# Patient Record
Sex: Female | Born: 1945
Health system: Southern US, Community
[De-identification: ages and names within clinical notes are randomized; demographics above are authoritative.]

## PROBLEM LIST (undated history)

## (undated) DIAGNOSIS — K219 Gastro-esophageal reflux disease without esophagitis: Secondary | ICD-10-CM

## (undated) DIAGNOSIS — Z981 Arthrodesis status: Secondary | ICD-10-CM

## (undated) DIAGNOSIS — I714 Abdominal aortic aneurysm, without rupture, unspecified: Secondary | ICD-10-CM

## (undated) DIAGNOSIS — K589 Irritable bowel syndrome without diarrhea: Secondary | ICD-10-CM

## (undated) DIAGNOSIS — E785 Hyperlipidemia, unspecified: Secondary | ICD-10-CM

## (undated) DIAGNOSIS — I341 Nonrheumatic mitral (valve) prolapse: Secondary | ICD-10-CM

## (undated) DIAGNOSIS — B019 Varicella without complication: Secondary | ICD-10-CM

## (undated) DIAGNOSIS — I8390 Asymptomatic varicose veins of unspecified lower extremity: Secondary | ICD-10-CM

## (undated) DIAGNOSIS — I4510 Unspecified right bundle-branch block: Secondary | ICD-10-CM

## (undated) DIAGNOSIS — J449 Chronic obstructive pulmonary disease, unspecified: Secondary | ICD-10-CM

## (undated) DIAGNOSIS — Z9889 Other specified postprocedural states: Secondary | ICD-10-CM

## (undated) DIAGNOSIS — M5136 Other intervertebral disc degeneration, lumbar region: Secondary | ICD-10-CM

## (undated) DIAGNOSIS — M1990 Unspecified osteoarthritis, unspecified site: Secondary | ICD-10-CM

## (undated) DIAGNOSIS — I739 Peripheral vascular disease, unspecified: Secondary | ICD-10-CM

## (undated) DIAGNOSIS — Z460 Encounter for fitting and adjustment of spectacles and contact lenses: Secondary | ICD-10-CM

## (undated) DIAGNOSIS — J302 Other seasonal allergic rhinitis: Secondary | ICD-10-CM

## (undated) DIAGNOSIS — I340 Nonrheumatic mitral (valve) insufficiency: Secondary | ICD-10-CM

## (undated) DIAGNOSIS — R519 Headache, unspecified: Secondary | ICD-10-CM

## (undated) DIAGNOSIS — Z9289 Personal history of other medical treatment: Secondary | ICD-10-CM

## (undated) DIAGNOSIS — M51369 Other intervertebral disc degeneration, lumbar region without mention of lumbar back pain or lower extremity pain: Secondary | ICD-10-CM

## (undated) HISTORY — DX: Hyperlipidemia, unspecified: E78.5

## (undated) HISTORY — DX: Personal history of other medical treatment: Z92.89

## (undated) HISTORY — DX: Asymptomatic varicose veins of unspecified lower extremity: I83.90

## (undated) HISTORY — PX: TONSILLECTOMY: SUR1361

## (undated) HISTORY — DX: Varicella without complication: B01.9

## (undated) HISTORY — DX: Nonrheumatic mitral (valve) insufficiency: I34.0

## (undated) HISTORY — PX: CHOLECYSTECTOMY: SHX55

## (undated) HISTORY — PX: BREAST BIOPSY: SHX20

## (undated) HISTORY — PX: BACK SURGERY: SHX140

## (undated) HISTORY — DX: Gastro-esophageal reflux disease without esophagitis: K21.9

## (undated) HISTORY — DX: Unspecified right bundle-branch block: I45.10

## (undated) HISTORY — PX: APPENDECTOMY: SHX54

## (undated) HISTORY — PX: JOINT REPLACEMENT: SHX530

## (undated) HISTORY — PX: COLONOSCOPY: SHX174

## (undated) HISTORY — PX: BREAST EXCISIONAL BIOPSY: SUR124

## (undated) HISTORY — PX: OTHER SURGICAL HISTORY: SHX169

## (undated) HISTORY — DX: Nonrheumatic mitral (valve) prolapse: I34.1

---
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
## 1991-09-30 HISTORY — PX: VAGINAL HYSTERECTOMY: SUR661

## 1998-06-13 ENCOUNTER — Ambulatory Visit (HOSPITAL_COMMUNITY): Admission: RE | Admit: 1998-06-13 | Discharge: 1998-06-13 | Payer: Self-pay | Admitting: Neurosurgery

## 1998-06-13 ENCOUNTER — Encounter: Payer: Self-pay | Admitting: Neurosurgery

## 1998-06-18 ENCOUNTER — Ambulatory Visit (HOSPITAL_COMMUNITY): Admission: RE | Admit: 1998-06-18 | Discharge: 1998-06-18 | Payer: Self-pay | Admitting: Neurosurgery

## 1998-06-29 ENCOUNTER — Encounter: Payer: Self-pay | Admitting: Neurosurgery

## 1998-06-29 ENCOUNTER — Inpatient Hospital Stay (HOSPITAL_COMMUNITY): Admission: RE | Admit: 1998-06-29 | Discharge: 1998-06-30 | Payer: Self-pay | Admitting: Neurosurgery

## 1998-10-04 ENCOUNTER — Other Ambulatory Visit: Admission: RE | Admit: 1998-10-04 | Discharge: 1998-10-04 | Payer: Self-pay | Admitting: Obstetrics and Gynecology

## 1998-10-30 ENCOUNTER — Encounter: Payer: Self-pay | Admitting: Obstetrics and Gynecology

## 1998-10-30 ENCOUNTER — Ambulatory Visit (HOSPITAL_COMMUNITY): Admission: RE | Admit: 1998-10-30 | Discharge: 1998-10-30 | Payer: Self-pay | Admitting: Obstetrics and Gynecology

## 1999-08-20 ENCOUNTER — Ambulatory Visit (HOSPITAL_COMMUNITY): Admission: RE | Admit: 1999-08-20 | Discharge: 1999-08-20 | Payer: Self-pay | Admitting: Gastroenterology

## 1999-11-04 ENCOUNTER — Ambulatory Visit (HOSPITAL_COMMUNITY): Admission: RE | Admit: 1999-11-04 | Discharge: 1999-11-04 | Payer: Self-pay | Admitting: Obstetrics and Gynecology

## 1999-11-04 ENCOUNTER — Encounter: Payer: Self-pay | Admitting: Obstetrics and Gynecology

## 1999-11-26 ENCOUNTER — Other Ambulatory Visit: Admission: RE | Admit: 1999-11-26 | Discharge: 1999-11-26 | Payer: Self-pay | Admitting: Obstetrics and Gynecology

## 2000-06-02 ENCOUNTER — Encounter: Payer: Self-pay | Admitting: Gastroenterology

## 2000-06-02 ENCOUNTER — Ambulatory Visit (HOSPITAL_COMMUNITY): Admission: RE | Admit: 2000-06-02 | Discharge: 2000-06-02 | Payer: Self-pay | Admitting: Gastroenterology

## 2000-06-08 ENCOUNTER — Ambulatory Visit (HOSPITAL_COMMUNITY): Admission: RE | Admit: 2000-06-08 | Discharge: 2000-06-08 | Payer: Self-pay | Admitting: Gastroenterology

## 2000-06-08 ENCOUNTER — Encounter: Payer: Self-pay | Admitting: Gastroenterology

## 2000-06-23 ENCOUNTER — Ambulatory Visit (HOSPITAL_COMMUNITY): Admission: RE | Admit: 2000-06-23 | Discharge: 2000-06-23 | Payer: Self-pay | Admitting: Gastroenterology

## 2000-06-23 ENCOUNTER — Encounter: Payer: Self-pay | Admitting: Gastroenterology

## 2000-07-01 ENCOUNTER — Ambulatory Visit (HOSPITAL_COMMUNITY): Admission: RE | Admit: 2000-07-01 | Discharge: 2000-07-01 | Payer: Self-pay | Admitting: Gastroenterology

## 2000-07-01 ENCOUNTER — Encounter (INDEPENDENT_AMBULATORY_CARE_PROVIDER_SITE_OTHER): Payer: Self-pay | Admitting: Specialist

## 2000-08-04 ENCOUNTER — Encounter: Payer: Self-pay | Admitting: Neurosurgery

## 2000-08-04 ENCOUNTER — Ambulatory Visit (HOSPITAL_COMMUNITY): Admission: RE | Admit: 2000-08-04 | Discharge: 2000-08-04 | Payer: Self-pay | Admitting: Neurosurgery

## 2000-08-14 ENCOUNTER — Encounter: Admission: RE | Admit: 2000-08-14 | Discharge: 2000-11-12 | Payer: Self-pay | Admitting: Anesthesiology

## 2000-10-02 ENCOUNTER — Encounter: Payer: Self-pay | Admitting: Neurosurgery

## 2000-10-06 ENCOUNTER — Ambulatory Visit (HOSPITAL_COMMUNITY): Admission: RE | Admit: 2000-10-06 | Discharge: 2000-10-07 | Payer: Self-pay | Admitting: Neurosurgery

## 2000-10-06 ENCOUNTER — Encounter: Payer: Self-pay | Admitting: Neurosurgery

## 2000-11-05 ENCOUNTER — Encounter: Payer: Self-pay | Admitting: Obstetrics and Gynecology

## 2000-11-05 ENCOUNTER — Ambulatory Visit (HOSPITAL_COMMUNITY): Admission: RE | Admit: 2000-11-05 | Discharge: 2000-11-05 | Payer: Self-pay | Admitting: Obstetrics and Gynecology

## 2000-11-25 ENCOUNTER — Other Ambulatory Visit: Admission: RE | Admit: 2000-11-25 | Discharge: 2000-11-25 | Payer: Self-pay | Admitting: Obstetrics and Gynecology

## 2002-01-05 ENCOUNTER — Other Ambulatory Visit: Admission: RE | Admit: 2002-01-05 | Discharge: 2002-01-05 | Payer: Self-pay | Admitting: Obstetrics and Gynecology

## 2002-09-17 ENCOUNTER — Encounter: Payer: Self-pay | Admitting: Neurosurgery

## 2002-09-17 ENCOUNTER — Ambulatory Visit (HOSPITAL_COMMUNITY): Admission: RE | Admit: 2002-09-17 | Discharge: 2002-09-17 | Payer: Self-pay | Admitting: Neurosurgery

## 2002-10-04 ENCOUNTER — Ambulatory Visit (HOSPITAL_COMMUNITY): Admission: RE | Admit: 2002-10-04 | Discharge: 2002-10-04 | Payer: Self-pay | Admitting: Neurosurgery

## 2002-10-04 ENCOUNTER — Encounter: Payer: Self-pay | Admitting: Neurosurgery

## 2002-10-07 ENCOUNTER — Encounter: Payer: Self-pay | Admitting: Neurosurgery

## 2002-10-07 ENCOUNTER — Inpatient Hospital Stay (HOSPITAL_COMMUNITY): Admission: RE | Admit: 2002-10-07 | Discharge: 2002-10-09 | Payer: Self-pay | Admitting: Neurosurgery

## 2003-01-26 ENCOUNTER — Other Ambulatory Visit: Admission: RE | Admit: 2003-01-26 | Discharge: 2003-01-26 | Payer: Self-pay | Admitting: Obstetrics and Gynecology

## 2003-02-10 ENCOUNTER — Encounter: Payer: Self-pay | Admitting: Orthopaedic Surgery

## 2003-02-10 ENCOUNTER — Ambulatory Visit (HOSPITAL_COMMUNITY): Admission: RE | Admit: 2003-02-10 | Discharge: 2003-02-10 | Payer: Self-pay | Admitting: Orthopaedic Surgery

## 2003-04-13 HISTORY — PX: NM MYOCAR PERF WALL MOTION: HXRAD629

## 2003-04-13 HISTORY — PX: US ECHOCARDIOGRAPHY: HXRAD669

## 2003-10-24 ENCOUNTER — Ambulatory Visit (HOSPITAL_COMMUNITY): Admission: RE | Admit: 2003-10-24 | Discharge: 2003-10-24 | Payer: Self-pay | Admitting: Obstetrics and Gynecology

## 2003-10-24 ENCOUNTER — Inpatient Hospital Stay (HOSPITAL_COMMUNITY): Admission: EM | Admit: 2003-10-24 | Discharge: 2003-10-25 | Payer: Self-pay | Admitting: *Deleted

## 2003-10-30 ENCOUNTER — Encounter: Admission: RE | Admit: 2003-10-30 | Discharge: 2003-10-30 | Payer: Self-pay | Admitting: Obstetrics and Gynecology

## 2003-10-30 ENCOUNTER — Encounter (INDEPENDENT_AMBULATORY_CARE_PROVIDER_SITE_OTHER): Payer: Self-pay | Admitting: *Deleted

## 2003-12-12 ENCOUNTER — Ambulatory Visit (HOSPITAL_COMMUNITY): Admission: RE | Admit: 2003-12-12 | Discharge: 2003-12-12 | Payer: Self-pay | Admitting: Surgery

## 2003-12-12 ENCOUNTER — Encounter (INDEPENDENT_AMBULATORY_CARE_PROVIDER_SITE_OTHER): Payer: Self-pay | Admitting: *Deleted

## 2003-12-12 ENCOUNTER — Ambulatory Visit (HOSPITAL_BASED_OUTPATIENT_CLINIC_OR_DEPARTMENT_OTHER): Admission: RE | Admit: 2003-12-12 | Discharge: 2003-12-12 | Payer: Self-pay | Admitting: Surgery

## 2004-02-06 ENCOUNTER — Other Ambulatory Visit: Admission: RE | Admit: 2004-02-06 | Discharge: 2004-02-06 | Payer: Self-pay | Admitting: Obstetrics and Gynecology

## 2004-06-06 ENCOUNTER — Encounter: Admission: RE | Admit: 2004-06-06 | Discharge: 2004-06-06 | Payer: Self-pay | Admitting: Obstetrics and Gynecology

## 2004-07-22 ENCOUNTER — Encounter: Admission: RE | Admit: 2004-07-22 | Discharge: 2004-08-27 | Payer: Self-pay | Admitting: Family Medicine

## 2004-09-05 ENCOUNTER — Encounter: Admission: RE | Admit: 2004-09-05 | Discharge: 2004-09-05 | Payer: Self-pay | Admitting: Sports Medicine

## 2004-09-10 ENCOUNTER — Encounter: Admission: RE | Admit: 2004-09-10 | Discharge: 2004-09-10 | Payer: Self-pay | Admitting: Orthopedic Surgery

## 2004-09-29 HISTORY — PX: TOTAL HIP ARTHROPLASTY: SHX124

## 2004-10-21 ENCOUNTER — Ambulatory Visit (HOSPITAL_COMMUNITY): Admission: RE | Admit: 2004-10-21 | Discharge: 2004-10-21 | Payer: Self-pay | Admitting: Orthopedic Surgery

## 2004-12-16 ENCOUNTER — Inpatient Hospital Stay (HOSPITAL_COMMUNITY): Admission: RE | Admit: 2004-12-16 | Discharge: 2004-12-19 | Payer: Self-pay | Admitting: Orthopedic Surgery

## 2005-01-06 ENCOUNTER — Encounter: Admission: RE | Admit: 2005-01-06 | Discharge: 2005-04-06 | Payer: Self-pay | Admitting: Orthopedic Surgery

## 2005-03-19 ENCOUNTER — Other Ambulatory Visit: Admission: RE | Admit: 2005-03-19 | Discharge: 2005-03-19 | Payer: Self-pay | Admitting: Obstetrics and Gynecology

## 2006-01-28 ENCOUNTER — Encounter: Payer: Self-pay | Admitting: Neurosurgery

## 2006-02-25 ENCOUNTER — Encounter: Admission: RE | Admit: 2006-02-25 | Discharge: 2006-02-25 | Payer: Self-pay | Admitting: Obstetrics and Gynecology

## 2006-03-25 ENCOUNTER — Other Ambulatory Visit: Admission: RE | Admit: 2006-03-25 | Discharge: 2006-03-25 | Payer: Self-pay | Admitting: Obstetrics and Gynecology

## 2009-07-12 ENCOUNTER — Encounter: Admission: RE | Admit: 2009-07-12 | Discharge: 2009-07-12 | Payer: Self-pay | Admitting: Orthopedic Surgery

## 2009-09-11 ENCOUNTER — Encounter: Admission: RE | Admit: 2009-09-11 | Discharge: 2009-09-11 | Payer: Self-pay | Admitting: Obstetrics and Gynecology

## 2009-09-29 HISTORY — PX: TOTAL HIP ARTHROPLASTY: SHX124

## 2009-10-24 ENCOUNTER — Inpatient Hospital Stay (HOSPITAL_COMMUNITY): Admission: RE | Admit: 2009-10-24 | Discharge: 2009-10-26 | Payer: Self-pay | Admitting: Orthopedic Surgery

## 2009-11-01 ENCOUNTER — Encounter: Admission: RE | Admit: 2009-11-01 | Discharge: 2010-01-30 | Payer: Self-pay | Admitting: Orthopedic Surgery

## 2010-09-29 LAB — HM COLONOSCOPY

## 2010-10-19 ENCOUNTER — Encounter: Payer: Self-pay | Admitting: Obstetrics and Gynecology

## 2010-12-16 LAB — BASIC METABOLIC PANEL
BUN: 4 mg/dL — ABNORMAL LOW (ref 6–23)
BUN: 5 mg/dL — ABNORMAL LOW (ref 6–23)
Calcium: 8.3 mg/dL — ABNORMAL LOW (ref 8.4–10.5)
Chloride: 101 mEq/L (ref 96–112)
Chloride: 98 mEq/L (ref 96–112)
Creatinine, Ser: 0.71 mg/dL (ref 0.4–1.2)
Creatinine, Ser: 0.74 mg/dL (ref 0.4–1.2)
GFR calc Af Amer: >60 mL/min (ref >60)
GFR calc non Af Amer: >60 mL/min (ref >60)
Glucose, Bld: 133 mg/dL — ABNORMAL HIGH (ref 70–99)
Potassium: 4.1 mEq/L (ref 3.5–5.1)

## 2010-12-16 LAB — CBC
HCT: 29.7 % — ABNORMAL LOW (ref 36.0–46.0)
MCV: 97.5 fL (ref 78.0–100.0)
MCV: 97.7 fL (ref 78.0–100.0)
MCV: 98.8 fL (ref 78.0–100.0)
Platelets: 153 10*3/uL (ref 150–400)
Platelets: 169 10*3/uL (ref 150–400)
RBC: 4.6 MIL/uL (ref 3.87–5.11)
RDW: 13.6 % (ref 11.5–15.5)
WBC: 10 10*3/uL (ref 4.0–10.5)
WBC: 11.1 10*3/uL — ABNORMAL HIGH (ref 4.0–10.5)
WBC: 8.5 10*3/uL (ref 4.0–10.5)

## 2010-12-16 LAB — URINALYSIS, ROUTINE W REFLEX MICROSCOPIC
Bilirubin Urine: NEGATIVE
Glucose, UA: NEGATIVE mg/dL
Glucose, UA: NEGATIVE mg/dL
Hgb urine dipstick: NEGATIVE
Ketones, ur: NEGATIVE mg/dL
Nitrite: NEGATIVE
Specific Gravity, Urine: 1.005 (ref 1.005–1.030)
Specific Gravity, Urine: 1.01 (ref 1.005–1.030)
pH: 7 (ref 5.0–8.0)
pH: 7.5 (ref 5.0–8.0)

## 2010-12-16 LAB — URINALYSIS, MICROSCOPIC ONLY
Bilirubin Urine: NEGATIVE
Hgb urine dipstick: NEGATIVE
Ketones, ur: NEGATIVE mg/dL
Nitrite: NEGATIVE
Urobilinogen, UA: 1 mg/dL (ref 0.0–1.0)

## 2010-12-16 LAB — URINE MICROSCOPIC-ADD ON

## 2010-12-16 LAB — TYPE AND SCREEN
ABO/RH(D): O POS
Antibody Screen: NEGATIVE

## 2010-12-16 LAB — PROTIME-INR
INR: 1.35 (ref 0.00–1.49)
Prothrombin Time: 14.4 seconds (ref 11.6–15.2)
Prothrombin Time: 16.6 seconds — ABNORMAL HIGH (ref 11.6–15.2)

## 2010-12-16 LAB — COMPREHENSIVE METABOLIC PANEL
ALT: 15 U/L (ref 0–35)
AST: 24 U/L (ref 0–37)
Alkaline Phosphatase: 69 U/L (ref 39–117)
CO2: 27 mEq/L (ref 19–32)
Chloride: 100 mEq/L (ref 96–112)
Creatinine, Ser: 0.77 mg/dL (ref 0.4–1.2)
GFR calc Af Amer: >60 mL/min (ref >60)
GFR calc non Af Amer: >60 mL/min (ref >60)
Potassium: 4.1 mEq/L (ref 3.5–5.1)
Total Bilirubin: 0.4 mg/dL (ref 0.3–1.2)

## 2010-12-16 LAB — URINE CULTURE

## 2010-12-16 LAB — APTT: aPTT: 34 seconds (ref 24–37)

## 2010-12-16 LAB — ABO/RH: ABO/RH(D): O POS

## 2011-02-03 ENCOUNTER — Other Ambulatory Visit: Payer: Self-pay | Admitting: Gastroenterology

## 2011-02-14 NOTE — Discharge Summary (Signed)
[name], [name]               ACCOUNT NO.:  [id number]   MEDICAL RECORD NO.:  [id number]          PATIENT TYPE:  INP   LOCATION:  5038                         FACILITY:  [facility]   PHYSICIAN:  Loreta Ave, M.D. DATE OF BIRTH:  04-Sep-1946   DATE OF ADMISSION:  12/16/2004  DATE OF DISCHARGE:  12/19/2004                                 DISCHARGE SUMMARY   FINAL DIAGNOSES:  1.  Status post left total hip replacement, ceramic on ceramic, for end-      stage degenerative joint disease.  2.  Mitral valve disorder.  3.  Hypertension, not otherwise specified.  4.  Esophageal reflux disease.  5.  Long-term use of anticoagulants.   HISTORY OF PRESENT ILLNESS:  [name] with Barbara history of end-  stage DJD, left hip, and chronic pain who presented to our office for  preoperative evaluation for left total hip replacement. She had  progressively worsening symptoms with failure to respond to conservative  treatment. Significant decrease in her daily activities due to the ongoing  complaint.   PREADMISSION LABORATORY DATA:  WBC 10.4, hemoglobin 14.6, platelets 249. PT  12.0, INR 0.8, PTT 32. Sodium 138, potassium 3.6, chloride 105, CO2 30,  glucose 90, BUN 5, creatinine 0.7, calcium 9.1. Total protein 6.4, albumin  3.7. UA negative. AST 22, ALT 12, alkaline phosphatase 75, total bilirubin  0.4.   HOSPITAL COURSE:  On December 16, 2004, the patient was taken to the [facility]  operating room and Barbara left total hip replacement procedure performed. Surgeon  Loreta Ave, M.D., and assistant Zonia Kief, P.Barbara.-C. Anesthesia  general. EBL 300 cc. There were no surgical or anesthesia complications, and  the patient was transferred to the recovery room in stable condition. On  December 17, 2004, patient doing well without specific complaints. Vital signs  stable, afebrile. Hemoglobin 9.7, glucose 142, INR 1.1. Dressing now clean,  dry, and intact. Wound looked good. Staples intact. No  signs of infection.  Mild left calf tenderness. Pharmacy protocol Coumadin started. PT and OT  consult. December 18, 2004, good pain control. Completed some hall ambulation.  Vital signs stable, afebrile. WBC 13.0, hemoglobin 8.8, glucose 129, INR  1.2. Wound looked good. No signs of infection. Calf nontender. Discontinued  morphine PCA, Foley, and O2. Hep-Locked IV. Checked UA/urine C&S. December 19, 2004, patient doing extremely well. Good pain control. Ambulating down hall.  Saying that she is ready to go home. Vital signs stable, afebrile.  Hemoglobin 8.4, INR 1.1. UA negative. Would looked good. No signs of  infection. Calf nontender, neurovascular intact. The patient ready for  discharge home.   DISPOSITION:  Discharged home.   MEDICATIONS:  1.  Percocet 5/325 one to two tablets p.o. q.4-6h. p.r.n.  2.  Lovenox 30 mg subcu b.i.d. for 3 days.  3.  Coumadin 5 mg p.o. q.d. until INR therapeutic. Remain on Coumadin for 4      weeks for postoperative DVT prophylaxis.  4.  Robaxin 500 mg p.o. q.6h. p.r.n. for spasms.   CONDITION:  Good and stable.   INSTRUCTIONS:  She will continue to work with home health PT to improve  range of motion, strengthening and ambulation. Home health R.N. to monitor  protimes while on Coumadin. Staples to be removed two weeks postoperatively.  She will follow up in office in two weeks for recheck. Return sooner as  needed.       DFM/MEDQ  D:  03/15/2005  T:  03/15/2005  Job:  [id number]

## 2011-02-14 NOTE — Op Note (Signed)
NAMESAFAA, [name]               ACCOUNT NO.:  [id number]   MEDICAL RECORD NO.:  [id number]          PATIENT TYPE:  INP   LOCATION:  2854                         FACILITY:  [facility]   PHYSICIAN:  Loreta Ave, M.D. DATE OF BIRTH:  1946/02/29   DATE OF PROCEDURE:  12/16/2004  DATE OF DISCHARGE:                                 OPERATIVE REPORT   PREOPERATIVE DIAGNOSIS:  End-stage degenerative arthritis, left hip.   POSTOPERATIVE DIAGNOSIS:  End-stage degenerative arthritis, left hip.   OPERATIVE PROCEDURE:  Left total hip replacement, Osteonics prosthesis.  Press-Fit 54 mm size F posterior stabilized acetabular metallic component.  Screw fixation x 2.  A 32 mm internal diameter, 0 degree ceramic liner, size  F.  Press-Fit femoral component with a #9 component 13 mm distal stem,  Secure Fit Plus type.  A 32 mm ceramic head +5.   SURGEON:  Loreta Ave, M.D.   ASSISTANT:  Genene Churn. Denton Meek.   ANESTHESIA:  General.   BLOOD LOSS:  300 mL.   BLOOD GIVEN:  None.   SPECIMENS:  Excised bone and soft tissue.   CULTURES:  None.   COMPLICATIONS:  None.   DRESSING:  Soft compressive with abduction pillow.   DESCRIPTION OF PROCEDURE:  The patient was brought to the operating room and  placed on the operating table in the supine position.  After adequate  anesthesia had been obtained, turned to a lateral position left side up.  Prepped and draped in the usual sterile fashion.  Had an incision along the  shaft of the femur extending posterosuperior at the trochanter.  The skin  and subcutaneous tissue were divided.  The iliotibial band was opened.  Charnley retractor put in place.  Neurovascular structures identified and  protected.  External rotator and capsule taken down off of the back of the  intertrochanteric groove of the femur and tagged with FiberWire suture.  The  hip dislocated posteriorly.  Marked flattening and extensive degenerative  change on the top of the  femoral head.  Femoral head removed through the  neck one fingerbreadth above the lesser trochanter.  The acetabulum was  exposed.  Grade 3 and 4 changes.  Further degenerative tearing of the  labrum.  The labrum was resected.  Sequential reaming down to good bleeding  bone with normal medial and inferior position.  Sized for a 54 mm component,  which was hammered in place, set a 45 degrees of abduction and 20 degrees of  anteversion.  Good capture and fixation.  Augmented with two screws through  the cup, a 16 mm and a 20 mm.  A size F 32 mm internal diameter ceramic  insert was then placed.  Attention was turned to the femur.  Sequential  reaming proximally and distally with the handheld and power reamers.  Good  capturing and fitting proximal and distal with a #9 component 13 mm distal  stem.  Restored normal anteversion.  After appropriate trialing, that stem  was chosen and hammered into place with good seating, capturing and fitting  and good restoration of anteversion.  After appropriate trials, I chose a 32  mm size F head, which was attached to the femur and the hip reduced.  Excellent restoration of leg lengths.  Excellent stability in flexion and  extension with nice fluid motion.  Wound irrigated.  External rotator  capsule repair to the back of the intertrochanteric groove on the femur  through drill holes and sutures tied over a bony bridge.  Charnley retractor  removed.  Wound irrigated.  Iliotibial band closed with 1-0 Vicryl, skin and  subcutaneous tissue with Vicryl and staples.  The margins were injected with  Marcaine.  A sterile compressive dressing was applied.  Returned to the  supine position.  Abduction pillow placed.  Anesthesia reversed.  Brought to  recovery room.  Tolerated surgery well.  No complications.      DFM/MEDQ  D:  12/16/2004  T:  12/16/2004  Job:  [id number]

## 2011-02-14 NOTE — H&P (Signed)
NAME:  [name], [name]                         ACCOUNT NO.:  [id number]   MEDICAL RECORD NO.:  [id number]                   PATIENT TYPE:  INP   LOCATION:  1824                                 FACILITY:  [facility]   PHYSICIAN:  Richard A. Alanda Amass, M.D.          DATE OF BIRTH:  [birth year]   DATE OF ADMISSION:  10/24/2003  DATE OF DISCHARGE:                                HISTORY & PHYSICAL   PRIMARY CARE PHYSICIAN:  Paulita Cradle, M.D., [facility].   PRIMARY CARDIOLOGIST:  Richard A. Alanda Amass, M.D.   CHIEF COMPLAINT:  New onset of chest pain with ongoing chest pressure.   BRIEF HISTORY:  The patient is a 65 year old white female well known to Dr.  Alanda Amass.  The patient has a positive history of her mother and grandmother  both dying from a thoracic aneurysm and abdominal aortic aneurysm,  respectively.  Dr. Laneta Simmers repaired her mother's first thoracic dissection,  but she had a second and died from complications from this.  The patient was  driving down [address] today at around 12:30 p.m. when she developed acute  pain in her mid left chest which went to her back and to both arms.  The  pain persisted for approximately one hour.  She continues to have pressure.  The patient notes there is a elephant sitting in her chest.  She denies  shortness of breath.  No nausea, no vomiting, and no diaphoresis.  She  presented to the ER about an hour later at 1337 hours.  Initial evaluation  by the ER, including point of care markers are negative so far x 2.  Her EKG  shows a new right bundle branch block which was not on her last EKG in the  office in June of 2004.  She was seen and evaluated by Nanetta Batty, M.D.,  and he plans to obtain a CT scan of the chest to rule out pulmonary embolus  and aortic dissection.  We also plan to place her on heparin and  nitroglycerin, obtain CKs and troponins, and tentatively schedule for  cardiac catheterization in the  a.m.  She has a history of 24-hour monitoring  which showed 22.7 SVT beats on a 24-hour recorder.  There is no other  significant ectopy.  A 2-D echocardiogram on April 13, 2003, showed an EF of  65% with mild tricuspid regurgitation and Doppler suggesting pulmonary  hypertension.  There was a thickened anterior leaflet and central mitral  valve with no mitral valve prolapse.  A 2-D echocardiogram on April 13, 2003,  showed no ischemia with an EF of 78%.  The patient also had a Persantine  Cardiolite which was negative and showed an ejection fraction of 78%.   PAST MEDICAL HISTORY:  1. Hyperlipidemia.  2. Questionable mitral valve prolapse.  Negative on her last 2-D     echocardiogram.  3. History of ongoing tobacco use of about  a half of a pack per day.  4. History of irritable bowel syndrome followed by Dr. Kinnie Scales.  5. Degenerative joint disease with three back surgeries by Hilda Lias,     M.D.   ALLERGIES:  None.   HABITS:  ETOH:  Social.  Cigarettes:  About a half of a pack per day.  Drugs:  None.   CURRENT MEDICATIONS:  1. Dilacor 240 mg daily.  2. Toprol XL 50 mg daily.  3. Lasix 20 mg on Monday, Wednesday, and Friday.  4. Potassium chloride 20 mEq on Monday, Wednesday, and Friday.  5. Zocor 40 mg daily h.s.  6. Climara patches 0.0375 mg twice a week.  7. Vicodin p.r.n. for her chronic back pain.   SOCIAL HISTORY:  She is married.  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  They have no children.   FAMILY HISTORY:  Her father died at age 26 with an MI.  Her mother had a  thoracic aortic dissection x 2 and died after her second dissection.   REVIEW OF SYSTEMS:  CARDIOVASCULAR:  She has had a headache since her  problem started.  Nitroglycerin did not make it significantly worse.  PULMONARY:  No PND, no orthopnea, no dyspnea on exertion, and no wheezing.  CARDIAC:  No pain prior to today.  GASTROINTESTINAL:  Negative.  GENITOURINARY:   Negative.  EXTREMITIES:  Negative.   PHYSICAL EXAMINATION:  GENERAL APPEARANCE:  This is a well-nourished, well-  developed, white female in no acute distress.  VITAL SIGNS:  The blood pressure is 114/50, the heart rate is 173 in sinus  rhythm, the respiratory rate is 18-20, and O2 saturations are 99% on 2 L  nasal cannula.  HEENT:  Head:  Normocephalic.  Eyes:  PERRLA.  EOMs intact.  Fundi not  visualized.  Ears, nose, mouth, and throat grossly within normal limits.  NECK:  No JVD.  No bruits.  No thyromegaly.  CHEST:  Clear to auscultation and percussion.  HEART:  Normal S1 and S2.  No murmurs, rubs, or gallops.  Pulses are +2 and  equal bilaterally.  ABDOMEN:  Soft and nontender.  Positive bowel sounds.  No palpable  organomegaly.  GENITOURINARY/RECTAL:  Deferred.  Not medically indicated.  EXTREMITIES:  No cyanosis, clubbing, or edema.  NEUROLOGIC:  No focal deficits.   LABORATORY DATA:  The white count is 7.9, hemoglobin 14.2, hematocrit 41,  and platelet count 228,000.  Point of care markers:  #1 showed a myoglobin  of 32, a CK of less than 1, and a troponin I of less than 0.05.  The second  set showed a myoglobin of 31.8, a CK of 2.5, and a troponin of less than  0.05.  The sodium was 137, potassium 3.7, chloride 106, CO2 29, BUN 10,  creatinine 0.8, and glucose 91.  The white count is 7.9, the hemoglobin is  14.2, the hematocrit is 41.0, and platelets are 222,000.   IMPRESSION:  1. Chest pain, rule out myocardial infarction, rule out aortic dissection,     rule out pulmonary embolus.  2. Hyperlipidemia.  3. Ongoing tobacco use.  4. Positive family history of coronary artery disease.  5. Chronic back surgeries/degenerative joint disease, status post three     prior back surgeries.   PLAN:  1. CT of the chest.  2. Nitroglycerin and heparin drips.  3. Rule out MI.  4. Cardiac catheterization.     Eber Hong, [name]  Richard A. Alanda Amass,  M.D.    WDJ/MEDQ  D:  10/24/2003  T:  10/24/2003  Job:  [id number]   cc:   Lupita Leash M.D. Bevelyn Buckles  [facility]

## 2011-02-14 NOTE — Op Note (Signed)
NAME:  [name], [name]                         ACCOUNT NO.:  [id number]   MEDICAL RECORD NO.:  [id number]                   PATIENT TYPE:  OIB   LOCATION:  5038                                 FACILITY:  [facility]   PHYSICIAN:  Hilda Lias, M.D.                DATE OF BIRTH:  06/30/1946   DATE OF PROCEDURE:  DATE OF DISCHARGE:                                 OPERATIVE REPORT   PREOPERATIVE DIAGNOSES:  Right 3-4, 4-5, 5-1 spondylosis with radiculopathy  and foraminal stenosis.   POSTOPERATIVE DIAGNOSIS:  Right 3-4, 4-5, 5-1 spondylosis with radiculopathy  foraminal stenosis.   PROCEDURE:  1. Right 3-4, 4-5, 5-1 foraminotomy.  2. Decompression of the 3-4, 4-5 and S1 nerve roots.  3. Microscope video   SURGEON:  Hilda Lias, M.D.   ASSISTANT:  Clydene Fake, M.D.   CLINICAL HISTORY:  The patient was admitted a year after having surgery on  the left side.  She is having pain down to the right leg and she is quite  miserable.  She declined more conservative treatment.  MRI and myelogram  showed degenerative disk disease with the stenosis below 3-4, 4-5, 5-1.  Surgery was advised.  The patient knows since she already had surgery on the  left side she may end up having fusion later on  in future.   PROCEDURE:  The patient was taken to the operating room and she was  positioned prone manner.  The back was prepped with Betadine.  The previous  scar tissue in the skin was removed, and dissection was carried out on the  right side.  X-rays showed that indeed at the level of 3-4.  Then we did a  foraminotomy/laminotomy at the level of 3-4 with decompression at the L3 and  L4 levels.  The same procedure was done at the level of 4-5 where there was  found quite a bit of spondylosis being the stenosis was at this level.  Decompression at the L5 nerve root was also accomplished.  Then at the level  of 5-1 a small foraminotomy with decompression of this one nerve root was  accomplished.  At the end, we have plenty room for all the nerve roots, but  the lamina floor was freed now, and we had to resect to end.  Nevertheless,  the superior facet was left intact.  Having done this, the area was  irrigated. Valsalva maneuver was negative.  Then, Fentanyl and DepoMedrol  were left ink the dural space, and the wound was closed with Vicryl and  Steri-Strips.                                                 Hilda Lias, M.D.    EB/MEDQ  D:  10/07/2002  T:  10/08/2002  Job:  [id number]

## 2011-02-14 NOTE — Procedures (Signed)
[facility]  Patient:    [name], [name]                      MRN: [id number] Proc. Date: 07/01/00 Adm. Date:  [id number] Attending:  Deneen Harts CC:         Monica Becton, M.D.   Procedure Report  PROCEDURE:  Panendoscopy.  INDICATION FOR PROCEDURE:  A 65 year old white female with idiosyncratic intractable nausea, no vomiting. Symptoms have been ongoing now for the past month and a half. Limiting activities. No weight loss. The patient had a similar presentation approximately 6 years ago at which time extensive evaluation was undertaken all of which was normal. She has been treated empirically with PTI therapy without symptomatic improvement. Undergoing endoscopy as an initial evaluation for her present symptoms which are thought to be functional in etiology.  DESCRIPTION OF PROCEDURE:  After reviewing the nature of the procedure with the patient including potential risks and complications and after discussing alternative methods of diagnosis and treatment, informed consent was signed.  The patient was premedicated with topical anesthetic followed by IV sedation totaling Versed 8 mg, fentanyl 87.5 mcg IV.  Using the Olympus video endoscope, the proximal esophagus was intubated under direct vision. Normal oropharynx. No lesion of the epiglottis, vocal chords or piriform sinus. The proximal esophagus intubated without difficulty. This appeared normal as did the mid and distal segments. There is a discreet mucosal Z line at 41 cm. No evidence of reflux induced esophagitis, candida esophagitis, or neoplasia. No hiatal hernia is appreciated.  The gastric fundus is normal. The body and antrum are mildly inflamed. Mucosa is characterized by a stippled appearance. There are several areas of coffee ground exudate, no erosion or ulceration detected. The mucosa is also erythematous and slightly spastic. The pylorus is symmetric and  easily traversed. The duodenal bulb is mildly inflamed again with erythematous edematous mucosa but without erosion or ulceration. The second portion of the duodenum appears normal.  The scope is withdrawn into the stomach, retroflexed view of the angularis, lesser curve, gastric cardia and fundus confirm the above findings without additional lesion noted. Biopsies were obtained from the angularis and prepyloric antrum and submitted for rapid urease testing to detect Helicobacter.  The duodenum was then reintubated and biopsies obtained from the second portion of the duodenum to rule out celiac sprue. The stomach was decompressed, scope withdrawn.  The patient tolerated the procedure without difficulty being maintained on Datascope monitor and low flow oxygen throughout. Returned to recovery in stable condition.  ASSESSMENT: 1. Gastritis--mild. Helicobacter biopsy obtained. 2. Duodenitis--mild--biopsy obtained, rule out enteropathy.  RECOMMENDATIONS: 1. Follow-up pathology. 2. Treat if Helicobacter positive. 3. Initiate Nexium 40 mg p.o. q.a.m. 4. Avoid gastric irritants. 5. Return office visit 3-4 weeks. DD:  07/01/00 TD:  07/02/00 Job: [id number]

## 2011-02-14 NOTE — H&P (Signed)
NAME:  [name]                         ACCOUNT NO.:  [id number]   MEDICAL RECORD NO.:  [id number]                   PATIENT TYPE:  OIB   LOCATION:  2866                                 FACILITY:  [facility]   PHYSICIAN:  Hilda Lias, M.D.                DATE OF BIRTH:  1945/12/31   DATE OF ADMISSION:  10/07/2002  DATE OF DISCHARGE:                                HISTORY & PHYSICAL   HISTORY:  [name] is a lady who back in January one year ago underwent  level 3-4, 4-5 laminectomy and diskectomy.  The patient did really well.  She never came back to see me but unfortunately, on December 11 she came to  see me because of back pain this time going posterolateral radicular  __________ without weakness.  The pain is getting worse, she is not any  better.  We decided to go ahead with conservative treatment.  She did not  improve.  The MRI was remarkable with an abnormal mammogram which also  showed degenerative disk disease with foraminal stenosis of the level 3-4  and 4-5.  I wanted for her to have injections but in the past they have  failed so she wanted to go ahead with surgery.   PAST MEDICAL HISTORY:  1. Cholecystectomy.  2. Breast biopsy.  3. Hysterectomy.  4. Left 3-4, 4-5 foraminotomy.   FAMILY HISTORY:  Mother has congestive heart failure and father died when he  was 74 of a heart attack.   REVIEW OF SYSTEMS:  Significant for mitral valve prolapse, irritable bowel  syndrome, high cholesterol.   PHYSICAL EXAMINATION:  The patient appeared in the office later from the ER  at __________.  VITAL SIGNS: Normal.  NECK:  Normal.  NOSE: There is some mild __________ bilaterally.  HEART: Heart sounds normal.  ABDOMEN:  There is a scar from previous surgery.  EXTREMITIES:  Normal pulse.  LUMBAR SPINE:  She has a scar from previous surgery.  NEUROLOGIC:  Mentation and affect normal.  Cranial nerves normal.  Strength  5/5, positive for weakness on dorsiflexion of the  right foot and heel.  Reflexes symmetrical.  Sensation:  She complains of numbness which are  compromised possibly with the L4 on L5 nerve root.  Straight leg raising is  positive by 45 degrees.  The MRI showed that, indeed, she has postoperative  changes at the site of the level of 4-5.  She has an area of degenerative  disk disease and foraminal stenosis at L3-L4 and L4-5.   IMPRESSION:  Right L3-4, 4-5 foraminal stenosis.  Patient will be admitted  for decompression and foraminotomy.  She must know the risks of infection,  CSF leak, postoperative pain, paralysis and agrees for surgery the  possibility because the amount  of degenerative disease that she has, that  she might end up having back fusion.  Hilda Lias, M.D.   EB/MEDQ  D:  10/07/2002  T:  10/07/2002  Job:  [id number]

## 2011-02-14 NOTE — Cardiovascular Report (Signed)
[name], [name]                         ACCOUNT NO.:  [id number]   MEDICAL RECORD NO.:  [id number]                   PATIENT TYPE:  INP   LOCATION:  2860                                 FACILITY:  [facility]   PHYSICIAN:  Darlin Priestly, M.D.             DATE OF BIRTH:  Oct 24, 1945   DATE OF PROCEDURE:  10/25/2003  DATE OF DISCHARGE:  10/25/2003                              CARDIAC CATHETERIZATION   PROCEDURE:  1. Left heart catheterization.  2. Coronary angiography.  3. Left ventriculogram   ATTENDING PHYSICIAN:  Darlin Priestly, M.D.   INDICATIONS:  [name] is a 65 year old female, patient of Dr. Susa Griffins and Dr. Paulita Cradle in [facility], with a  history of hyperlipidemia, mitral valve prolapse, tobacco use, history of  aortic valve syndrome.  She was admitted on 10/24/2003 with substernal chest  pain.  She did rule out for myocardial infarction.  She did undergo spiral  CT to rule out aortic dissection and pulmonary embolism which was negative.  She is now referred for cardiac catheterization to assess her coronary  status.   DESCRIPTION OF PROCEDURE:  After giving informed written consent, the  patient brought to the cardiac cath lab.  Right and left groin were shaved,  prepped, and draped in the usual sterile fashion.  ECG monitor were  established.  Using modified Seldinger technique, a #6-French arterial  sheath was inserted through the right femoral artery.  A #6-French  diagnostic catheterization was then performed.  This reveals a large left  main with no significant disease.  These large vessels coursed through the  apex and gave rise to one diagonal branch.  The LAD showed some mild  calcification in its proximal mid segment.  The LAD is noted to have some  mild 40% mid-vessel narrowing.  The first diagonal is a large vessel that  bifurcates distally.  There is no significant disease.   The left circumflex is a large vessel  coursing the AV groove and gave rise  to one large obtuse marginal branch.  The AV circumflex has no significant  disease.  The first OM is a large vessel that bifurcates distally and has no  significant disease.   The right coronary artery is a large vessel and is dominant and gives rise  to the PDA as well as the posterolateral branch.  There is no significant  disease in the RCA, PDA, or posterolateral branch.   Left ventriculogram reveals a preserved EF of 60%.   HEMODYNAMICS:  Systemic arterial pressure 135/84, LV systemic pressure  136/17, LVEDP of 20.   CONCLUSIONS:  1. No significant CAD.  2. Normal LV systolic function.  Darlin Priestly, M.D.    RHM/MEDQ  D:  10/25/2003  T:  10/26/2003  Job:  [id number]   cc:   Gerlene Burdock A. Alanda Amass, M.D.  [phone] [address], [address]  [place]  [place] [address]  Fax: [phone]   Paulita Cradle, M.D.  [facility]   Darlin Priestly, M.D.  [phone] [address]  [place]  [place] [address]  Fax: [phone]

## 2011-02-14 NOTE — Procedures (Signed)
[facility]  Patient:    [name], [name]                      MRN: [id number] Proc. Date: 08/24/00 Adm. Date:  [id number] Attending:  Thyra Breed CC:         Tanya Nones. Jeral Fruit, M.D.  Monica Becton, M.D.   Procedure Report  PROCEDURE:  Lumbar epidural steroid injection.  DIAGNOSIS:  Lumbar spondylosis characterized by degenerative disk disease and facet joint arthropathy with pain radiating into the anterior thighs bilaterally.  INTERVAL HISTORY:  The patient has not noted a great deal of improvement after the first injection.  She continues with the Vicodin for the pain control which she says is not very effective.  She has prominent disruptions in her sleep patterns.  PHYSICAL EXAMINATION:  Blood pressure 102/50, heart rate 73, respiratory rate 12, O2 saturations 99%.  Pain level is 8/10, and temperature is 97.0.  Her back shows good healing from previous injection site.  Neuro examination is grossly unchanged.  DESCRIPTION OF PROCEDURE:  After informed consent was obtained, the patient was placed in a sitting position and monitored.  Her back was prepped with Betadine x 3.  A skin wheal was raised at the L3-4 interspace with 1% lidocaine.  A 20 gauge Tuohy needle was introduced in the lumbar epidural space to loss of resistance to preservative-free normal saline.  There was no CSF nor blood.  Medrol 40 mg in 10 mL of preservative-free normal saline was gently injected.  The needle was flushed with preservative-free normal saline and removed intact.  Postprocedure condition - stable.  DISCHARGE INSTRUCTIONS: 1. Resume previous diet. 2. Limitations on activities per instruction sheet. 3. Continue on current medications with the addition of amitriptyline 10 mg 1    p.o. q.p.m. #30 with two refills. 4. Follow up in one to two weeks to consider a third injection.  If she is not    noticing any response to this injection, we will hold  off on a third    injection at that time. DD:  08/24/00 TD:  08/24/00 Job: [id number]

## 2011-02-14 NOTE — Discharge Summary (Signed)
NAME:  [name], [name]                         ACCOUNT NO.:  [id number]   MEDICAL RECORD NO.:  [id number]                   PATIENT TYPE:  INP   LOCATION:  2860                                 FACILITY:  [facility]   PHYSICIAN:  Richard A. Alanda Amass, M.D.          DATE OF BIRTH:  Dec 14, 1945   DATE OF ADMISSION:  10/24/2003  DATE OF DISCHARGE:  10/25/2003                                 DISCHARGE SUMMARY   Ms. [name] is a 65 year old white female patient of Dr. Susa Griffins.   She came into the emergency room with chest pain complaint. She underwent CT  scan of her chest to rule out PE, DVT or aneurysm and it was negative. It  was decided that she should undergo cardiac catheterization secondary to her  multiple cardiac risk factors and the extent of her chest pain. She also had  new right bundle branch block on her EKG.   She was seen by Dr. Nanetta Batty in the ER. She went to have a cardiac  catheterization performed by Dr. Lenise Herald on 10/25/2003. It revealed  only 40 percent in her LAD, her EF was 60 percent.   She had no other stenosis. It was decided that she could be discharged home  on 10/25/2003 after her bed rest hours were up. She did become nauseated and  complained about headache, apparently she had had a headache prior to her  admission also with her chest pain. She had been given morphine for her  headache and then she became nauseated. She was given Zofran during or after  her catheterization. This did not help. She was then given Phenergan IV 25  mg which helped with both the headache and the nausea.   She will be discharged pending her bed rest hours being up and her vital  signs being stable and after she walks in the hall.   Labs the morning of her cath showed a hemoglobin of 12.4, hematocrit 36.4,  WBCs 8.3 and platelets 200. Her CK-MBs and troponin were negative x 2. Her  sodium was 140, potassium was 3.7, BUN 10, creatinine 0.8, glucose was  91.  Her CT scan was negative for any pathology. Her chest x-ray showed no active  disease.   DISCHARGE MEDICATIONS:  She should not take Lasix or K-Dur this week.  1. Dilacor 240 mg daily.  2. Toprol XL 50 mg daily.  3. Lasix and K-dur only used if needed.  4. Zocor 40 mg at bedtime.  5. Climara patch 0.0375, as used previously.  6. Vicodin p.r.n. for back pain. She has been on this a long time. She takes     about a half a pill three times per week.  7. She can take her Tylenol, ibuprofen when needed for groin discomfort.   She should do no strenuous activity, no lifting, pushing or pulling x four  days, no driving x 2 days and no  prolonged walking or hiking or exercising x  one week. If she has any problems with her groin she is to call our office.  She will follow-up with [name], [name], at Dr. Kandis Cocking office at 9  a.m. on 11/03/2003.   DISCHARGE DIAGNOSES:  1. Chest pain, not ischemic.  2. Coronary artery disease, mild with 40 percent mid left anterior     descending stenosis.  3. History of palpitations.  4. Hypertension.  5. Dyslipidemia.   Lipid profile in the hospital showed a total cholesterol of 135, HDL of 54,  the LDL was 14, the LDL was 67. Triglycerides were 72.      Lezlie Octave, N.P.                        Richard A. Alanda Amass, M.D.    BB/MEDQ  D:  10/25/2003  T:  10/26/2003  Job:  [id number]   cc:   Paulita Cradle, N.P.  [facility] [employer].

## 2011-02-14 NOTE — H&P (Signed)
[place]  Patient:    [name], [name]                      MRN: [id number] Adm. Date:  [id number] Attending:  Thyra Breed                         History and Physical  HISTORY OF PRESENT ILLNESS:  Mrs. Morr is a lady who in the past underwent left L4-5 foraminotomy and 3-4 with a left L4 hemilaminectomy.  That was done back in 1999 and the patient had been doing really well.  She came to see me on several occasions to the office because of pain down to the left leg.  The patient has had elevation at the [facility] with dural injection with no improvement whatsoever.  The patient was offered some strong pain medication such as OxyContin but the patient declined because she did not want to get addicted to any medication.  She is miserable.  She has some numbness in both thighs when she walks.  Upon looking at the x-ray, I found that at the level of 3-4 she has a herniated disc and a diffuse disc at the L2-3, right worse than the left one.  She has some mild foraminal stenosis at the level of L4-5.  The patient is being admitted for surgery.  PAST MEDICAL HISTORY: Cholecystectomy, breast biopsy, hysterectomy and left L4 hemilaminectomy with a 3-4, L4-5 foraminotomy.  SOCIAL HISTORY:  She smokes 1/2 pack a day.  She does not drink.  FAMILY HISTORY: Mother died of heart failure and father died when he was 75 years old of a heart attack.  REVIEW OF SYSTEMS:  The patient has a history of irritable bowel syndrome, high cholesterol and mitral prolapse.  PHYSICAL EXAMINATION:  GENERAL:  The patient came to my office and the last time I saw her was about ten days ago when she was crying.  She was miserable.  She was complaining of pain down to the left leg.  HEENT:  Normal.  NECK: Normal.  LUNGS:  There is some rhonchi bilaterally.  HEART:  Sounds normal.  ABDOMEN:  Normal.  EXTREMITIES:  Normal pulses.  This started in the  midline at the lumbar area.  NEUROLOGIC:  Mental status normal.  Cranial nerves normal.  The strength showed that she has some mild weakness in iliopsoas and the quadriceps on the left side and normal on the right side.  The reflexes were symmetrical.  The straight leg raising was 70 degrees bilaterally and she is highly positive for stretch maneuver on the left side and negative on the right side.  Sensation was normal.  Coordination was normal.  The x-ray, especially the myelogram showed that she has some degenerative disc disease at multiple levels but at the level of 3-4 going to the left, she has what looks like a foraminal herniated disc and there is some question about diffuse disc at L2-3, right worse than the left one.  She has a mild bulging disc and moderate foraminal stenosis at L4-5.  CLINICAL IMPRESSION:  Leg left pain with left L3-L4 herniated disc.  RECOMMENDATIONS:  We are going to schedule her for a left L3-4 diskectomy and we are going to take a look at the L4-5 and probably at the level of L2-3 on the left side.  Since the patient has 95% of the pain in the left  leg, we are not approach the right side.  She knows about the risks such as infection, CSF leak, worsening of the pain, need for further surgery which might require fusion.  Also the patient was advised to stop smoking. DD:  10/06/00 TD:  10/06/00 Job: [id number]

## 2011-02-14 NOTE — H&P (Signed)
[facility]  Patient:    [name], [name]                      MRN: [id number] Adm. Date:  [id number] Attending:  Thyra Breed CC:         Tanya Nones. Jeral Fruit, M.D.  Monica Becton, M.D.   History and Physical  FOLLOW-UP EVALUATION:  HISTORY OF PRESENT ILLNESS:  [name] comes in for follow-up evaluation of her chronic low-back pain on the basis of lumbar spondylosis.  Since her last evaluation she has not noted any benefits from the injections.  She is not interested in any other injection therapy.  I discussed treatment options which included increasing her amitriptyline, a trial of anticonvulsants in the form of Neurontin or Trileptal or even chronic long-acting opiate therapy. Non-pharmacological interventions would be more intensive physical therapy and/or pain coping skills/hypnotherapy.  She is not interested in pursing these avenues until she has had another chance to sit down with Dr. Jeral Fruit and review the possibilities of any surgical interventions.  I advised her that I would have no problem with this in as much she needs to fill confident that she has exhausted those options before proceeding with pain management.  PHYSICAL EXAMINATION:  VITAL SIGNS:  Blood pressure 108/59, heart rate is 96, respiratory rate is 20. O2 saturation is 97%.  Pain level is 8/10.  EXTREMITIES:  Straight leg raise signs were negative.  Deep tendon reflexes were symmetric at 1+ at the knees and absent at the ankles.  IMPRESSION: 1. Low-back pain on the basis of lumbar spondylolysis predominately affecting    the L3-4 distribution. 2. Other medical problems per Dr. Vernon Prey.  DISPOSITION: 1. The patient plans to go ahead and be seen by Dr. Jeral Fruit to see weather    there were any surgical options at this point. 2. If no surgical options are available, I plan to see her back in followup in     eight weeks at which time we will outline a pain management  course for    greater detail.  In the interim I have increased her amitriptyline to 50 mg    one to two q.p.m. since she is not sleeping well at night. DD:  09/02/00 TD:  09/02/00 Job: [id number]

## 2011-02-14 NOTE — Op Note (Signed)
[facility]  Patient:    [name], [name]                      MRN: [id number] Proc. Date: 08/14/00 Adm. Date:  [id number] Attending:  Thyra Breed CC:         Monica Becton, M.D.  Tanya Nones. Jeral Fruit, M.D.   Operative Report  PROCEDURE:  Lumbar epidural steroid injection.  DIAGNOSIS:  Multilevel degenerative disk disease.  Facet joint arthropathy with pain in the back radiating out into the anterior thighs bilaterally.  INTERVENTIONAL HISTORY:  [name] is a very pleasant 65 year old who is sent to [place] by Dr. Hilda Lias for a series of lumbar epidural steroid injections.  The patient states that she was in her usual state of health up until two years ago, when she awoke one morning with some discomfort in her lower back.  She was seen by her chiropractor, Dr. Linton Ham, in [place].  Dr. Linton Ham felt she had more than just a strain-type syndrome and sent her to Dr. Hilda Lias who obtained an MRI.  This led to Dr. Jeral Fruit evaluating her.  She ultimately she underwent a L4-5 and L5-S1 foraminotomy.  She did immensely well.  She was pain free and able to get up and out without any problems, until about a year later.  At that time she had a flare up after she had been lifting some objects.  She was able to return back to her chiropractor and received some acupuncture and chiropractic treatments; did well up until about six weeks ago.  She then noted that increasing intensification of a lower back discomfort, radiating out into the anterior thighs bilaterally.  She described this discomfort as a constant, dull nagging pain.  She saw her chiropractor who advised her that she needed to go ahead and see Dr. Jeral Fruit.  She was seen by Dr. Jeral Fruit on July 29, 2000 and a myelogram was performed.  Subsequent to this she is sent for a series of lumbar epidural steroid injections.  The patient notes that her pain is made worse by being up and about.  It  is improved by leaning forward over a counter, using ice and heat.  She has noted that sleeping on her side with her knees bent and curled up she is able to reduce her discomfort.  She has taken Vicodin with mild improvement, but does not like taking this medication.  She complains of numbness and tingling into the anterior thighs bilaterally.  She denied any bowel or bladder incontinence or weakness.  MEDICATIONS: 1. Viable dot. 2. Miralax. 3. Delacor. 4. Toprol. 5. Zocor. 6. Lasix. 7. K-Dur.  ALLERGIES:  No known drug allergies.  FAMILY HISTORY:  Positive for coronary artery disease and cancer.  PAST SURGICAL HISTORY:  Significant for her back surgery by Dr. Jeral Fruit, as mentioned above.  Cholecystectomy.  Hysterectomy.  SOCIAL HISTORY:  The patient is a 1/4 pack per day smoker.  She does not drink alcohol.  She took care of her terminally ill mother up until a few years ago, and since then has gone on road trips with her husband (who is a long Manufacturing engineer).  ACTIVE MEDICAL PROBLEMS:  Include mitral valve prolapse, hypertension, hypercholesterolemia, peripheral edema.  A history of Helicobacter infection of her stomach with subsequent symptoms of GERD (for which she sees Dr. Kinnie Scales and is currently taking Nexium).  She also carries the diagnosis of irritable bowel syndrome.  REVIEW OF SYSTEMS:  General:  Negative.  Head:  Negative.  Eyes:  Negative. Nose, Mouth, Throat, Ears:  Negative.  Pulmonary:  Negative.  Cardiovascular: GIC active medical problems.  GU:  Negative.  Musculoskeletal/Neurolgic:  See HPI.  Hematologic:  Negative.  Endocrine:  Negative.  Cutaneous:  Negative. Psychiatric:  Negative.  Allergy/Immunologic:  The patient states that she has some mild allergic-type symptoms.  PHYSICAL EXAMINATION:  VITAL SIGNS:  The patient is afebrile, with vital signs stable.  Her vital signs are on the flow sheet.  GENERAL:  This is a pleasant female, in no acute  distress.  HEAD:  Normocephalic, atraumatic.  Eyes -- Extraocular movements are intact, with conjunctivae and sclerae clear.  Nose, Throat, Ears -- Without discharge. Oropharynx was free of lesions.  She does have some crepitus on opening of her mouth at the TMJs.  NECK:  Supple without lymphadenopathy.  Carotids are 2+ and symmetric without bruits.  LUNGS:  Clear.  HEART:  Regular rate and rhythm with a grade 2/6 systolic murmur.  BREASTS/ABDOMEN/PELVIC/RECTAL:  Not performed.  BACK:  Revealed well healed surgical scar, with tenderness over the spinous processes; and to a lesser extent over the facet joint regions throughout the back.  NEUROLOGIC:  Straight-leg raise signs were positive at 80 degrees bilaterally. She is able to toe walk, heel walk and stand from a squatting position without difficulty.  She did have some weakness of her hip abductors.  The patient is oriented x 4.  Cranial nerves 2-12 are grossly intact. Deep tendon reflexes were 2+ and symmetric in the upper extremity; 1-2+ at the knees, absent at the ankles, with plantar reflexes downgoing.  Motor was as described above, with symmetric bulk and tone.  Sensory examination was significant for attenuated pinprick over the anterior aspect of the thighs, extending medial to the knees bilaterally in an identical distribution.  Coordination was grossly intact.  EXTREMITIES:  The patient demonstrates some mild early bony enlargement of the DIPs and first carpal metacarpal joints, with radial pulses and dorsalis pedis pulses 2+ and symmetric.  DIAGNOSTICS:  Myelogram from August 04, 2000 revealed L2-3 broad-based disk protrusion, with associated facet joint prominence and ligament of flavum thickening.  With neuroforaminal compromise bilaterally -- right greater than left.  At L3-4 there was broad-based protrusion more prominent to the left. There was L4-5 broad-based disk bulge with moderate left  neuroforaminal  compromise.  IMPRESSION: 1. Low back pain on the basis of lumbar spondylosis; characterized by    degenerative disk disease and facet joint arthropathy, with some    neuroforaminal compromise.  Predominately affecting the L3-4 disk    distribution. 2. Other medical problems per primary care physician, Dr. Vernon Prey, which    include: mitral valve prolapse, gastroesophageal reflux disease, irritable    bowel syndrome, peripheral edema, hypertension.  DISPOSITION:  I discussed the potential risks, benefits and limitations of a lumbar epidural steroid injection in detail with the patient, as well as the risks and side effects of corticosteroids in detail.  She is aware of the limitations of the procedure.  DESCRIPTION OF PROCEDURE:  After an informed consent was obtained, the patient was placed in the sitting position and monitored.  Her back was prepped with Betadine x 3.  A skin wheal was raised at the L3-4 interspace with 1% lidocaine.  A 20-gauge Tuohy needle was introduced into the lumbar epidural space to loss of resistance to preservative-free normal saline, this to a depth of 4.25 cm.  There was no  cerebral spinal fluid nor blood.  Then 40 mg of Medrol and 10 mL of preservative-free normal saline was gently injected. The needle was flushed with preservative-free normal saline and removed intact.  POSTPROCEDURE CONDITION:  Stable.  DISCHARGE INSTRUCTIONS: 1. Resume previous diet. 2. Limitation in activities per instruction sheet, as outlined by my assistant    today. 3. Continue with current medications. 4. The patient is to follow up with me in one to two weeks for second    injection. DD:  08/14/00 TD:  08/14/00 Job: [id number]

## 2011-02-14 NOTE — Op Note (Signed)
[place]. [facility]  Patient:    [name], [name]                      MRN: [id number] Proc. Date: 10/06/00 Adm. Date:  [id number] Attending:  Thyra Breed                           Operative Report  PREOPERATIVE DIAGNOSIS:  Left L3-L4 herniated disk, left 4-5 foraminal stenosis, left L2-3 foraminal stenosis.  Weakening of the quadriceps ____________  POSTOPERATIVE DIAGNOSIS:  Left L3-L4 herniated disk, left 4-5 foraminal stenosis, left L2-3 foraminal stenosis.  Weakening of the quadriceps ____________  OPERATION PERFORMED:  Left L3 hemilaminectomy, lysis of adhesions.  Left L3-4 diskectomy.  Foraminotomy 2-3 and 4-5.  Microscope.  SURGEON:  Tanya Nones. Jeral Fruit, M.D.  ASSISTANT:  Danae Orleans. Venetia Maxon, M.D.  ANESTHESIA:  INDICATIONS FOR PROCEDURE:  The patient is a 65 year old female who underwent left L4 hemilaminectomy about three years ago.  Now she is complaining of pain down to the left leg associated with weakness of the iliopsoas and quadricep. Myelogram showed that she had a herniated disk at the level of 3-4 into the foramen and questionable foraminal stenosis at the level 4-5 and 2-3.  The patient wanted to go head with surgery.  She failed with conservative treatment including the pain clinic at [facility]  The patient knew about the risks such as infection, CSF leak, need for further surgery which might require fusion.  DESCRIPTION OF PROCEDURE:  The patient was taken to the operating room and she was positioned in a prone manner.  The previous scar tissue was removed. Dissection was carried laterally in the left side.  X-ray showed that indeed we were at the level of 3-4.  Then using the drill, we removed the lamina of L3.  From then on, we were able to identify easily the L3-4 herniated disk. Using microdissection with the microscope reduction was done.  There was quite a bit of adhesion and what looked like some  fragment attached to the L3 nerve root.  Foraminotomy was done. Indeed there was a herniated disk going laterally.  Retraction of the dural sac was achieved and we entered the disk space with removal of quite a bit of degenerative disk, mostly going lateral but also medial.  At the end, we had good decompression of the  L3 and L4 nerve root.  We went down and with lysis of adhesions, we did foraminotomy until we decompressed the L5 nerve root.  The same procedure was done at the level of 2-3 on the left side.  X-ray showed that indeed we were in the area desired.  From then on Valsalva maneuver was negative.  The area was irrigated and fentanyl and Depo-Medrol were left in the dural space.  The wound was closed with Vicryl and a Steri-Strip.  The patient did well. DD:  10/06/00 TD:  10/06/00 Job: [id number]

## 2011-02-14 NOTE — Op Note (Signed)
NAME:  [name], [name]                         ACCOUNT NO.:  [id number]   MEDICAL RECORD NO.:  [id number]                   PATIENT TYPE:  AMB   LOCATION:  DSC                                  FACILITY:  [facility]   PHYSICIAN:  Thornton Park. Daphine Deutscher, M.D.             DATE OF BIRTH:  05-07-1946   DATE OF PROCEDURE:  12/12/2003  DATE OF DISCHARGE:                                 OPERATIVE REPORT   PREOPERATIVE DIAGNOSIS:  Palpable mass left breast and nevus below the left  breast.   POSTOPERATIVE DIAGNOSIS:  Palpable mass left breast and nevus below the left  breast.   PROCEDURE:  Left breast biopsy and excision of nevus.   SURGEON:  Thornton Park. Daphine Deutscher, M.D.   ANESTHESIA:  General by LMA.   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  She desired this to be removed en toto.  In addition,  she had a nevus beneath the left breast that she wanted removed because of  some changes.   She was taken to room 3 [facility] on March 15 and she and I palpated  the mass in the left breast which was about the 1 to 2 o'clock position in  the areolar margin and then she went on off to sleep.  The area had been  marked and was then prepped with Betadine and draped sterilely.  A  curvilinear incision was made around the areola and carried down deep and I  excised this area en toto.  I found palpable mass consistent with the  description of the mass on ultrasound and removed it along with some  surrounding little cysts.  The area was sent for permanent sections.  No  palpable mass remained when this biopsy was completed.  The wound was  irrigated.  Bleeding was controlled with the electrocautery.  The wound was  then closed with 4-0 Vicryl subcutaneously and subcuticular with 5-0 Vicryl  and with Benzoin and Steri-Strips.  With a fresh blade, I made an elliptical  incision  and excised the nevus beneath the left breast.  This was closed  with 5-0 Vicryl subcuticular and Benzoin and Steri-Strips.  The patient  tolerated the procedure well and was taken to the recovery room in  satisfactory condition.                                               Thornton Park Daphine Deutscher, M.D.    MBM/MEDQ  D:  12/12/2003  T:  12/12/2003  Job:  [id number]   cc:   Reuel Boom L. Eda Paschal, M.D.  [address], [address]  [place]  [place] [address]  Fax: [phone]   [facility]

## 2011-02-14 NOTE — Op Note (Signed)
NAMEFREDONIA, [name]               ACCOUNT NO.:  [id number]   MEDICAL RECORD NO.:  [id number]          PATIENT TYPE:  OIB   LOCATION:  2550                         FACILITY:  [facility]   PHYSICIAN:  Loreta Ave, M.D. DATE OF BIRTH:  10-10-1945   DATE OF PROCEDURE:  10/21/2004  DATE OF DISCHARGE:                                 OPERATIVE REPORT   PREOPERATIVE DIAGNOSIS:  Left hip early avascular necrosis.   POSTOPERATIVE DIAGNOSES:  Left hip extensive grade 3 and 4 degenerative  changes, acetabulum as well as femoral head. No obvious femoral head  collapse.  Numerous chondral loose bodies and chondral irregularity with  synovitis.   PROCEDURE:  Left hip examination under anesthesia, arthroscopy with removal  of loose bodies, generalized chondroplasty and partial synovectomy.   SURGEON:  Loreta Ave, M.D.   ASSISTANT:  Zonia Kief, [name]   ANESTHESIA:  General.   ESTIMATED BLOOD LOSS:  Minimal.   SPECIMENS:  None.   CULTURES:  None.   COMPLICATIONS:  None.   DRESSINGS:  Soft compressive.   DESCRIPTION OF PROCEDURE:  The patient was brought to the operating room,  placed on the operating table in the supine position.  After adequate  anesthesia had been obtained,  she was placed on the fracture table, lateral  position with appropriate padding and support.  With fluoroscopic guidance,  and after being prepped and draped in the usual sterile fashion, we applied  longitudinal traction.  The x-ray as well as previous scans had failed to  show significant degenerative changes in the hip itself.   Able to distract the hip about 1 cm without difficulty with the fracture  table. I then made a superoposterior and superoanterior portal over the  trochanter entering the hip from that approach, with first spinal needles,  guide wires and then enlarging 4 trocars for the arthroscope and  instruments. The arthroscope was introduced into the hip, avoiding any  injury to  neurovascular structures.  Hip distended and inspected.  Used both  portals alternatively for the shaver as well as arthroscope.   Despite her previous studies showing only possible early avascular changes  in the femoral head, she actually turned out to have allot more extensive  degenerative changes in the hip joint than I had anticipated.  Numerous  chondral loose bodies were removed. Areas of deep grade 3 as well as  approaches of some grade 4 chondromalacia acetabulum more than femoral head  were appreciated.  Most of the changes superoanterior and superoposterior.  Thorough chondroplasty throughout.  Marked thinning of articular cartilage  throughout. All loose bodies and synovitis removed.  Femoral head was  inspected and although there were some changes of chondral irregularity,  this really looked more degenerative than from avascular necrosis.  No areas  of collapse or significant avascular flaps.  The labrum had some  degeneration and was shaved, but there were no true labral tears.   At completion, all surfaces were made even, all loose bodies were removed.  The joint was inspected throughout. Instruments and fluid were removed and  the hip injected  with Marcaine. The hip was then allowed to reduce with  fluoroscopic confirmation of a nice, congruent reduction.  The wounds were  closed with nylon, injected with Marcaine.  Sterile, compressive dressing was applied, all traction had been removed.  Returned to the supine position. Anesthesia was reversed, brought to the  recovery room, tolerated surgery well, no complications.      DFM/MEDQ  D:  10/21/2004  T:  10/21/2004  Job:  [id number]

## 2011-03-10 ENCOUNTER — Other Ambulatory Visit: Payer: Self-pay | Admitting: Obstetrics and Gynecology

## 2011-03-10 DIAGNOSIS — Z1231 Encounter for screening mammogram for malignant neoplasm of breast: Secondary | ICD-10-CM

## 2011-03-18 ENCOUNTER — Ambulatory Visit
Admission: RE | Admit: 2011-03-18 | Discharge: 2011-03-18 | Disposition: A | Discharge status: Home / Self Care | Payer: Medicare Other | Source: Ambulatory Visit | Attending: Obstetrics and Gynecology | Admitting: Obstetrics and Gynecology

## 2011-03-18 DIAGNOSIS — Z1231 Encounter for screening mammogram for malignant neoplasm of breast: Secondary | ICD-10-CM

## 2012-05-17 ENCOUNTER — Emergency Department (INDEPENDENT_AMBULATORY_CARE_PROVIDER_SITE_OTHER)
Admission: EM | Admit: 2012-05-17 | Discharge: 2012-05-17 | Disposition: A | Discharge status: Nursing Home (Includes ICF) | Payer: Medicare Other | Source: Home / Self Care

## 2012-05-17 ENCOUNTER — Encounter (HOSPITAL_COMMUNITY): Payer: Self-pay | Admitting: Emergency Medicine

## 2012-05-17 ENCOUNTER — Emergency Department (HOSPITAL_COMMUNITY)
Admission: EM | Admit: 2012-05-17 | Discharge: 2012-05-17 | Disposition: A | Discharge status: Home / Self Care | Payer: Medicare Other | Attending: Emergency Medicine | Admitting: Emergency Medicine

## 2012-05-17 DIAGNOSIS — F17200 Nicotine dependence, unspecified, uncomplicated: Secondary | ICD-10-CM | POA: Insufficient documentation

## 2012-05-17 DIAGNOSIS — T63461A Toxic effect of venom of wasps, accidental (unintentional), initial encounter: Secondary | ICD-10-CM | POA: Insufficient documentation

## 2012-05-17 DIAGNOSIS — IMO0002 Reserved for concepts with insufficient information to code with codable children: Secondary | ICD-10-CM

## 2012-05-17 DIAGNOSIS — T6391XA Toxic effect of contact with unspecified venomous animal, accidental (unintentional), initial encounter: Secondary | ICD-10-CM | POA: Insufficient documentation

## 2012-05-17 MED ORDER — HYDROCORTISONE 1 % EX CREA: TOPICAL_CREAM | CUTANEOUS | Status: AC

## 2012-05-17 MED ORDER — DIPHENHYDRAMINE HCL 25 MG PO TABS: 50.0000 mg | ORAL_TABLET | Freq: Three times a day (TID) | ORAL | Status: DC | PRN

## 2012-05-17 NOTE — ED Provider Notes (Signed)
History     CSN: [id number]  Arrival date & time 05/17/12  1513   First MD Initiated Contact with Patient 05/17/12 1542      Chief Complaint  Patient presents with  . Insect Bite     HPI 66 year old pleasant Caucasian female history of mitral valve prolapse, multiple surgeries including 2 surgeries presents with one-day history of wasp bite to the volar surface of the left hand.  She states she has had increasing swelling and pain over the past 24 hours despite trial of over-the-counter Benadryl and Benadryl cream in addition to?  Metronidazole cream over the area.  She states that she has also pretty had headache, which he thinks is radiating from the base of her spine up to her neck and head. She states that she is child some medications without much relief and is concerned enough today that she wanted to come to the urgent care center for review of the same She has no chills or fever She is having pain which is stable from the time that she was bitten   History reviewed. No pertinent past medical history.  History reviewed. No pertinent past surgical history.  No family history on file.  History  Substance Use Topics  . Smoking status: Current Everyday Smoker  . Smokeless tobacco: Not on file  . Alcohol Use: No    OB History    Grav Para Term Preterm Abortions TAB SAB Ect Mult Living                  Review of Systems Positive for headache, no photophobia no phonophobia no aversion to smell-poor sleep last night because patient could not get comfortable with her and pain No chest pain or shortness breath no blurred vision or double vision-no dysuria no abdominal pain no dark stool no tarry stool no nausea no vomiting  Allergies  Review of patient's allergies indicates no known allergies.  Home Medications  No current outpatient prescriptions on file.  BP 115/70  Pulse 95  Temp 98.6 F (37 C) (Oral)  Resp 18  SpO2 98%  Physical Exam  Skin:      Pleasant  Caucasian female looking about his stated age No icterus no pallor Chest clinically clear no tactile vocal resonance and fremitus S1-S2 murmur noted No lower extremity edema Abdomen soft nontender nondistended  ED Course  Procedures (including critical care time)  Labs Reviewed - No data to display No results found.   No diagnosis found.    MDM  66 year old Caucasian female presents with early cellulitis/potential compartment syndrome to the left hand/forearm. She will need further imaging studies including potential CT scan and prompt IV antibiotic therapies-I would transfer the patient to high level of care at [facility] emergency room.  Patient understands the same and is agreeable to this        Rhetta Mura, MD 05/17/12 1704

## 2012-05-17 NOTE — ED Provider Notes (Signed)
History   This chart was scribed for Aseret Hoffman B. Bernette Mayers, MD by Melba Coon. The patient was seen in room TR05C/TR05C and the patient's care was started at 6:53PM.    CSN: [id number]  Arrival date & time 05/17/12  1758   None     Chief Complaint  Patient presents with  . Arm Injury    (Consider location/radiation/quality/duration/timing/severity/associated sxs/prior treatment) The history is provided by the patient. No language interpreter was used.   [name] is a 66 y.o. female who presents to the Emergency Department complaining of persistent, moderate to severe, left hand pain and swelling pertaining to a wasp bite with an onset yesterday. Benadryl did not alleviate the symptoms; pt took 2x right after it happened, 2x last night, and 2x at 9:00PM this morning. Breathing nml compared to baseline. HA present. No fever, neck pain, sore throat, rash, back pain, CP, SOB, abd pain, n/v/d, dysuria, or extremity edema, weakness, numbness, or tingling. No known allergies. No other pertinent medical symptoms.  No PCP  History reviewed. No pertinent past medical history.  History reviewed. No pertinent past surgical history.  History reviewed. No pertinent family history.  History  Substance Use Topics  . Smoking status: Current Everyday Smoker  . Smokeless tobacco: Not on file  . Alcohol Use: No    OB History    Grav Para Term Preterm Abortions TAB SAB Ect Mult Living                  Review of Systems 10 Systems reviewed and all are negative for acute change except as noted in the HPI.   Allergies  Review of patient's allergies indicates no known allergies.  Home Medications   Current Outpatient Rx  Name Route Sig Dispense Refill  . CETIRIZINE HCL 10 MG PO TABS Oral Take 10 mg by mouth daily.      BP 127/66  Pulse 85  Temp 98.6 F (37 C) (Oral)  Resp 18  SpO2 100%  Physical Exam  Nursing note and vitals reviewed. Constitutional: She is oriented to  person, place, and time. She appears well-developed and well-nourished.  HENT:  Head: Normocephalic and atraumatic.  Eyes: EOM are normal. Pupils are equal, round, and reactive to light.  Neck: Normal range of motion. Neck supple.  Cardiovascular: Normal rate, normal heart sounds and intact distal pulses.   Pulmonary/Chest: Effort normal and breath sounds normal.  Abdominal: Bowel sounds are normal. She exhibits no distension. There is no tenderness.  Musculoskeletal: Normal range of motion. She exhibits no edema and no tenderness.  Neurological: She is alert and oriented to person, place, and time. She has normal strength. No cranial nerve deficit or sensory deficit.  Skin: Skin is warm and dry. No rash noted.       Bee sting to left dorsal hand with surrounding erythema and induration, extending to the mid-forearm. No warmth, streaking, or fluctuance.  Psychiatric: She has a normal mood and affect.    ED Course  Procedures (including critical care time)  DIAGNOSTIC STUDIES: Oxygen Saturation is 100% on room air, normal by my interpretation.    COORDINATION OF CARE:  6:55PM  - Cortisone cream will be Rx for the pt. Pt ready for d/c.  Labs Reviewed - No data to display No results found.   No diagnosis found.    MDM  Localized reaction to wasp sting, doubt this is cellulitis given relatively recent envenomation. Advised benadryl, ice packs, topical cortisone and RTED for  any worsening pain, swelling, streaking or fever.   I personally performed the services described in the documentation, which were scribed in my presence. The recorded information has been reviewed and considered.         Omunique Pederson B. Bernette Mayers, MD 05/17/12 2034

## 2012-05-17 NOTE — ED Notes (Signed)
Pt was stung on left wrist by a bee approx 18 hrs ago. Pt reports swelling is increasing, going up arm. Slightly red and warm.

## 2012-05-17 NOTE — ED Notes (Signed)
Pt here from [facility] with left hand swelling from wasp sting yesterday; pt with redness and swelling into forearm

## 2012-06-08 ENCOUNTER — Other Ambulatory Visit: Payer: Self-pay | Admitting: Obstetrics and Gynecology

## 2012-06-08 DIAGNOSIS — Z1231 Encounter for screening mammogram for malignant neoplasm of breast: Secondary | ICD-10-CM

## 2012-07-01 ENCOUNTER — Ambulatory Visit
Admission: RE | Admit: 2012-07-01 | Discharge: 2012-07-01 | Disposition: A | Discharge status: Home / Self Care | Payer: Medicare Other | Source: Ambulatory Visit | Attending: Obstetrics and Gynecology | Admitting: Obstetrics and Gynecology

## 2012-07-01 DIAGNOSIS — Z1231 Encounter for screening mammogram for malignant neoplasm of breast: Secondary | ICD-10-CM

## 2013-01-31 ENCOUNTER — Encounter: Payer: Self-pay | Admitting: Nurse Practitioner

## 2013-01-31 ENCOUNTER — Ambulatory Visit (INDEPENDENT_AMBULATORY_CARE_PROVIDER_SITE_OTHER): Payer: Medicare Other | Admitting: Nurse Practitioner

## 2013-01-31 VITALS — BP 102/60 | HR 104 | Temp 98.0°F | Ht 65.17 in | Wt 137.5 lb

## 2013-01-31 DIAGNOSIS — Z8679 Personal history of other diseases of the circulatory system: Secondary | ICD-10-CM

## 2013-01-31 DIAGNOSIS — R319 Hematuria, unspecified: Secondary | ICD-10-CM

## 2013-01-31 DIAGNOSIS — I341 Nonrheumatic mitral (valve) prolapse: Secondary | ICD-10-CM | POA: Insufficient documentation

## 2013-01-31 DIAGNOSIS — I059 Rheumatic mitral valve disease, unspecified: Secondary | ICD-10-CM

## 2013-01-31 DIAGNOSIS — Z23 Encounter for immunization: Secondary | ICD-10-CM

## 2013-01-31 DIAGNOSIS — Z0000 Encounter for general adult medical examination without abnormal findings: Secondary | ICD-10-CM

## 2013-01-31 DIAGNOSIS — Z136 Encounter for screening for cardiovascular disorders: Secondary | ICD-10-CM

## 2013-01-31 DIAGNOSIS — Z13220 Encounter for screening for lipoid disorders: Secondary | ICD-10-CM

## 2013-01-31 DIAGNOSIS — F17200 Nicotine dependence, unspecified, uncomplicated: Secondary | ICD-10-CM

## 2013-01-31 LAB — CBC
HCT: 42.4 % (ref 36.0–46.0)
Hemoglobin: 14.6 g/dL (ref 12.0–15.0)
MCHC: 34.4 g/dL (ref 30.0–36.0)
Platelets: 239 10*3/uL (ref 150.0–400.0)
RDW: 14.8 % — ABNORMAL HIGH (ref 11.5–14.6)

## 2013-01-31 LAB — POCT URINALYSIS DIPSTICK
Bilirubin, UA: NEGATIVE
Ketones, UA: NEGATIVE
Protein, UA: NEGATIVE
pH, UA: 5.5

## 2013-01-31 MED ORDER — TETANUS-DIPHTH-ACELL PERTUSSIS 5-2.5-18.5 LF-MCG/0.5 IM SUSP
0.5000 mL | Freq: Once | INTRAMUSCULAR | Status: AC
  Administered 2013-01-31: 0.5 mL via INTRAMUSCULAR

## 2013-01-31 NOTE — Patient Instructions (Addendum)
We will call you if any labs are abnormal. I havce referred you back to Dr Alanda Amass for evaluation of history of Mitral Valve Prolapse. I will review Dr. Jennye Boroughs records once I receive them. You may need to see him again regarding ongoing nausea. Pleasure to meet you today!

## 2013-01-31 NOTE — Progress Notes (Signed)
Patient ID: [name], female   DOB: 1945/12/09, 67 y.o.   MRN: [id number] Subjective:     [name] is a 67 y.o. female and is here for a comprehensive physical exam. The patient reports recent headache lasting 3 days, accompanied by nausea.  History   Social History  . Marital Status: Legally Separated    Spouse Name: N/A    Number of Children: N/A  . Years of Education: N/A   Occupational History  . Not on file.   Social History Main Topics  . Smoking status: Current Every Day Smoker -- 0.50 packs/day for 30 years    Types: Cigarettes  . Smokeless tobacco: Never Used  . Alcohol Use: Yes     Comment: very occasional, not monthly  . Drug Use: No  . Sexually Active: No   Other Topics Concern  . Not on file   Social History Narrative  . No narrative on file   Health Maintenance  Topic Date Due  . Tetanus/tdap  07/15/1963  . Colonoscopy  07/14/1994  . Zostavax  07/14/2004  . Pneumococcal Polysaccharide Vaccine Age 49 And Over  07/14/2009  . Influenza Vaccine  05/30/2013  . Mammogram  07/01/2014    The following portions of the patient's history were reviewed and updated as appropriate: allergies, current medications, past family history, past medical history, past social history, past surgical history and problem list.  Review of Systems Constitutional: negative for anorexia, chills, fatigue, fevers, night sweats and weight loss Eyes: negative for irritation and redness Ears, nose, mouth, throat, and face: negative for earaches, hoarseness, nasal congestion, sore throat and voice change Respiratory: negative for asthma, cough and dyspnea on exertion Cardiovascular: negative for chest pain, chest pressure/discomfort, fatigue, irregular heart beat, near-syncope and palpitations Gastrointestinal: positive for nausea, negative for abdominal pain, change in bowel habits, constipation, diarrhea and vomiting Integument/breast: positive for breast lump, negative for  rash and skin color change Hematologic/lymphatic: negative Musculoskeletal:bilateral hip replacements, 3 back surgeries     Objective:    BP 102/60  Pulse 104  Temp(Src) 98 F (36.7 C) (Oral)  Ht 5' 5.17" (1.655 m)  Wt 137 lb 8 oz (62.37 kg)  BMI 22.77 kg/m2  SpO2 97% General appearance: alert, cooperative, appears older than stated age and no distress Head: Normocephalic, without obvious abnormality, atraumatic Eyes: negative findings: lids and lashes normal, conjunctivae and sclerae normal, corneas clear and pupils equal, round, reactive to light and accomodation, positive findings: wears contact lenses Ears: normal TM's and external ear canals both ears and cerumen in both canals, light brown, appears soft Throat: lips, mucosa, and tongue normal; teeth and gums normal Neck: no adenopathy, no carotid bruit, no JVD, supple, symmetrical, trachea midline and thyroid not enlarged, symmetric, no tenderness/mass/nodules Back: symmetric, no curvature. ROM normal. No CVA tenderness., scar midline, lumbar Lungs: diminished breath sounds bibasilar Heart: regular rate and rhythm, S1, S2 normal, no murmur, click, rub or gallop Abdomen: soft, non-tender; bowel sounds normal; no masses,  no organomegaly Extremities: Homans sign is negative, no sign of DVT, varicose veins noted and pedal pulses +1 bilaterally Pulses: strong radial pulses, diminished pedal pulses Skin: Skin color, texture, turgor normal. No rashes or lesions Lymph nodes: Cervical, supraclavicular, and axillary nodes normal. Neurologic: Grossly normal    Assessment:    1) Smoker, diminished breath sounds. Offered LDCT lungs for lung ca screen, pt deferred. 2)Need for tdap 3)Nausea 4)History of MVP with medical treatment by cardiology 5)cerumenosis    Plan:  1)Not ready to quit. Will offer LDCT for lung ca screen again 2)Tdap administered today 3)refer to Dr. Kinnie Scales for intermittent nausea, CBC, CMET, vit D, ua  today 4)Refer to cardiology for eval & Tx of hx of MVP 5)debrox or mineral oil twice monthly to soften wax 3)  See After Visit Summary for Counseling Recommendations

## 2013-02-01 LAB — COMPREHENSIVE METABOLIC PANEL
AST: 20 U/L (ref 0–37)
BUN: 10 mg/dL (ref 6–23)
Calcium: 8.9 mg/dL (ref 8.4–10.5)
Chloride: 103 mEq/L (ref 96–112)
Creatinine, Ser: 0.7 mg/dL (ref 0.4–1.2)
GFR: 82.82 mL/min (ref >60.00)
Total Bilirubin: 0.7 mg/dL (ref 0.3–1.2)

## 2013-02-01 LAB — LDL CHOLESTEROL, DIRECT: Direct LDL: 140.6 mg/dL

## 2013-02-01 LAB — LIPID PANEL
Cholesterol: 212 mg/dL — ABNORMAL HIGH (ref 0–200)
HDL: 51.2 mg/dL (ref >39.00)
Total CHOL/HDL Ratio: 4
Triglycerides: 147 mg/dL (ref 0.0–149.0)

## 2013-02-02 ENCOUNTER — Telehealth: Payer: Self-pay | Admitting: Nurse Practitioner

## 2013-02-02 DIAGNOSIS — R319 Hematuria, unspecified: Secondary | ICD-10-CM

## 2013-02-02 DIAGNOSIS — E785 Hyperlipidemia, unspecified: Secondary | ICD-10-CM

## 2013-02-02 DIAGNOSIS — E559 Vitamin D deficiency, unspecified: Secondary | ICD-10-CM

## 2013-02-02 MED ORDER — VITAMIN D3 1.25 MG (50000 UT) PO CAPS: 1.0000 | ORAL_CAPSULE | ORAL | Status: DC

## 2013-02-02 NOTE — Addendum Note (Signed)
Addended by: Alben Spittle, Konrad Felix [facility] on: 02/02/2013 04:21 PM   Modules accepted: Orders

## 2013-02-02 NOTE — Telephone Encounter (Signed)
See telephone notes

## 2013-02-03 LAB — URINALYSIS, MICROSCOPIC ONLY: Squamous Epithelial / LPF: NONE SEEN

## 2013-02-04 NOTE — Telephone Encounter (Signed)
Please contact the pharmacy. Patient states the pharmacy said they cannot fill the Rx the way it came through the computer.

## 2013-02-04 NOTE — Telephone Encounter (Addendum)
Called pharmacy back and they said vitamin d was put in wrong. Pharmacy said they would change the rx to the right one.

## 2013-02-04 NOTE — Addendum Note (Signed)
Addended by: Marlene Lard on: 02/04/2013 09:42 AM   Modules accepted: Orders

## 2013-02-07 ENCOUNTER — Encounter: Payer: Self-pay | Admitting: Nurse Practitioner

## 2013-02-07 DIAGNOSIS — M2550 Pain in unspecified joint: Secondary | ICD-10-CM | POA: Insufficient documentation

## 2013-02-09 ENCOUNTER — Telehealth: Payer: Self-pay | Admitting: Nurse Practitioner

## 2013-02-09 NOTE — Telephone Encounter (Signed)
Patient Information:  Caller Name: Marykay  Phone: [phone]  Patient: [name], [name]  Gender: Female  DOB: 11-30-1945  Age: 67 Years  PCP: Earley Favor [facility])  Office Follow Up:  Does the office need to follow up with this patient?: Yes  Instructions For The Office: Question regarding VIt D.  Thank yo.  RN Note:  Caller associating vomiting and diarrhea with Vit D and IBS.  Caller states she doesn't do well with supplements.  Caller wants to make Provider aware and to see if there is an alternative treatment.  Symptoms  Reason For Call & Symptoms: Pt started taking Vit D 5,000 unit OTC.  Pt started on 02/06/13 and developed  Diarrhea. Vomiting began 02/09/13.  Pt had 3 emesis since midnight. Pt was able to eat dry toast and keep it down.   Reviewed Health History In EMR: Yes  Reviewed Medications In EMR: Yes  Reviewed Allergies In EMR: Yes  Reviewed Surgeries / Procedures: Yes  Date of Onset of Symptoms: 02/07/2013  Guideline(s) Used:  Anaphylaxis  Vomiting  Disposition Per Guideline:   Home Care  Reason For Disposition Reached:   Vomiting with diarrhea  Advice Given:  Clear Liquids:  Sip water or a rehydration drink (e.g., Gatorade or Powerade).  Avoid Nonprescription Medicines:   Stop all nonprescription medicines for 24 hours (Reason: they may make vomiting worse).  Expected Course:  Vomiting from viral gastritis usually stops in 12 to 48 hours.  Patient Will Follow Care Advice:  YES

## 2013-02-09 NOTE — Telephone Encounter (Signed)
Please advise 

## 2013-05-06 ENCOUNTER — Encounter (HOSPITAL_BASED_OUTPATIENT_CLINIC_OR_DEPARTMENT_OTHER): Payer: Self-pay | Admitting: *Deleted

## 2013-05-06 NOTE — Progress Notes (Signed)
No labs needed

## 2013-05-09 ENCOUNTER — Ambulatory Visit: Payer: Medicare Other | Admitting: Nurse Practitioner

## 2013-05-10 NOTE — H&P (Signed)
[name] [address]   [address] [place], [place] [address] [phone] A Division of [facility]  [name], M.D.   [name], M.D.   [name], M.D.   [name], M.D.   [name], M.D [name], M.D.  [name], M.D.   [name], M.D.   [name], M.D. Barbara Churn. Barry Dienes, PA-C            Barbara A. Shepperson, PA-C Barbara Gillham, PA-C [place], [place]   RE: [name], [name]   [id number]      DOB: 25-Apr-1946 PROGRESS NOTE: 04-19-13 Reason for visit:  Evaluation right hip established patient visit. History of present illness: [name] is a 67 year old who underwent a right total hip arthroplasty 3 years ago by Dr. Eulah Melendez. She was doing well until she had insidious onset of pain over the lateral aspect of the right hip starting 2 months ago. It is dull in nature. She denies any traumatic history. Pain is 6/10. She has difficulty lying on this side. She does not note significant pain with range of motion.    Please see associated documentation for this clinic visit for further past medical, family, surgical and social history, review of systems, and exam findings as this was reviewed by me.  EXAMINATION: Well appearing female no acute distress. Right lower extremity is neurovascularly intact. Her incision is well healed and benign. She does not have pain with range of motion of the hip but has significant tenderness to palpation over the greater trochanteric bursa. She has good strength of the hip with normal gait.  ASSESSMENT: Patient with greater trochanteric bursitis in the setting of total hip arthroplasty.  PLAN: 1. We will perform a greater trochanteric bursa injection. 2. If this does not improve her pain we will obtain an MRI.  3. She will follow-up in one year otherwise.  PROCEDURE: The patient's clinical condition is marked by substantial pain and/or  significant functional disability. Other conservative therapy has not provided relief, is contraindicated, or not appropriate. There is a reasonable likelihood that injection will significantly improve the patient's pain and/or functional disability.   After routine sterile prep the use of ethyl chloride and 1% plain Xylocaine the right greater trochanteric bursa is injected with 2:6 Depo-Medrol/Marcaine without difficulty. The patient tolerated the procedure well.  [name].  [name], M.D.  Electronically verified by [name]. [name], M.D. TDM:kah D 04-20-13 T 04-21-13   [name] [address]   [address] [place],  [address] [phone] A Division of [facility]  [name], M.D.   [name], M.D.   [name], M.D.   [name], M.D.   [name], M.D [name], M.D.  [name], M.D.   [name], M.D.   [name], M.D. Barbara Churn. Barry Dienes, PA-C            Barbara A. Shepperson, PA-C Barbara [place], PA-C [place], [place]   RE: [name]                                [id number]      DOB: [birth year] PROGRESS NOTE: 05-03-13 [name] comes in for follow up.  I have reviewed the MRI of her hip.  Fortunately her prosthesis in all compartments are  good.  The problem is isolated to the trochanter as we anticipated.  Swelling, trochanteric bursitis.  Clinically contracture of the iliotibial band.  There also appears to be a possible partial tear of her gluteus minimus attachment to the trochanter.  I have reviewed the report, the scan and gone over it with her.  At this point in time she would like to proceed with definitive treatment and does not want to do any more shots.  Her injection did help her with Marcaine in place, but nothing beyond that.  We met and discussed her issue and options.  More than 25 minutes spent face-to-face covering this with her.  Discussed  operative intervention.  Iliotibial band tenotomy, release.  Excision of her bursa with debridement.  Repair of the tendon with a bio-anchor if a significant tear is found.  Outpatient.  How quick she can go Melendez-op is going to depend on whether or not I have to do a tendon repair and she understands that.  Procedure, risks, benefits and complications reviewed.  All paperwork complete.  All questions answered.  I will see her at the time of operative intervention.   [name], M.D.   Electronically verified by [name], M.D. DFM:jjh D 05-03-13 T 05-04-13

## 2013-05-11 ENCOUNTER — Other Ambulatory Visit: Payer: Self-pay | Admitting: Orthopedic Surgery

## 2013-05-12 ENCOUNTER — Encounter (HOSPITAL_BASED_OUTPATIENT_CLINIC_OR_DEPARTMENT_OTHER)
Admission: RE | Disposition: A | Discharge status: Home / Self Care | Payer: Self-pay | Source: Ambulatory Visit | Attending: Orthopedic Surgery

## 2013-05-12 ENCOUNTER — Ambulatory Visit (HOSPITAL_BASED_OUTPATIENT_CLINIC_OR_DEPARTMENT_OTHER)
Admission: RE | Admit: 2013-05-12 | Discharge: 2013-05-12 | Disposition: A | Discharge status: Home / Self Care | Payer: Medicare Other | Source: Ambulatory Visit | Attending: Orthopedic Surgery | Admitting: Orthopedic Surgery

## 2013-05-12 ENCOUNTER — Encounter (HOSPITAL_BASED_OUTPATIENT_CLINIC_OR_DEPARTMENT_OTHER): Payer: Self-pay | Admitting: Anesthesiology

## 2013-05-12 ENCOUNTER — Ambulatory Visit (HOSPITAL_BASED_OUTPATIENT_CLINIC_OR_DEPARTMENT_OTHER): Payer: Medicare Other | Admitting: Anesthesiology

## 2013-05-12 ENCOUNTER — Encounter (HOSPITAL_BASED_OUTPATIENT_CLINIC_OR_DEPARTMENT_OTHER): Payer: Self-pay | Admitting: *Deleted

## 2013-05-12 DIAGNOSIS — M76899 Other specified enthesopathies of unspecified lower limb, excluding foot: Secondary | ICD-10-CM | POA: Insufficient documentation

## 2013-05-12 DIAGNOSIS — Z96649 Presence of unspecified artificial hip joint: Secondary | ICD-10-CM | POA: Insufficient documentation

## 2013-05-12 DIAGNOSIS — M24559 Contracture, unspecified hip: Secondary | ICD-10-CM | POA: Insufficient documentation

## 2013-05-12 HISTORY — DX: Irritable bowel syndrome, unspecified: K58.9

## 2013-05-12 HISTORY — DX: Other intervertebral disc degeneration, lumbar region without mention of lumbar back pain or lower extremity pain: M51.369

## 2013-05-12 HISTORY — DX: Other intervertebral disc degeneration, lumbar region: M51.36

## 2013-05-12 HISTORY — DX: Other seasonal allergic rhinitis: J30.2

## 2013-05-12 HISTORY — DX: Encounter for fitting and adjustment of spectacles and contact lenses: Z46.0

## 2013-05-12 HISTORY — PX: FASCIOTOMY: SHX132

## 2013-05-12 HISTORY — PX: EXCISION/RELEASE BURSA HIP: SHX5014

## 2013-05-12 HISTORY — DX: Unspecified osteoarthritis, unspecified site: M19.90

## 2013-05-12 SURGERY — RELEASE, BURSA, TROCHANTERIC
Anesthesia: General | Site: Hip | Laterality: Right | Wound class: Clean

## 2013-05-12 MED ORDER — LIDOCAINE HCL (CARDIAC) 20 MG/ML IV SOLN
INTRAVENOUS | Status: DC | PRN
  Administered 2013-05-12: 60 mg via INTRAVENOUS

## 2013-05-12 MED ORDER — ONDANSETRON HCL 4 MG/2ML IJ SOLN: 4.0000 mg | Freq: Once | INTRAMUSCULAR | Status: DC | PRN

## 2013-05-12 MED ORDER — FENTANYL CITRATE 0.05 MG/ML IJ SOLN: 50.0000 ug | INTRAMUSCULAR | Status: DC | PRN

## 2013-05-12 MED ORDER — MIDAZOLAM HCL 2 MG/2ML IJ SOLN: 1.0000 mg | INTRAMUSCULAR | Status: DC | PRN

## 2013-05-12 MED ORDER — HYDROMORPHONE HCL PF 1 MG/ML IJ SOLN
0.2500 mg | INTRAMUSCULAR | Status: DC | PRN
  Administered 2013-05-12 (×2): 0.5 mg via INTRAVENOUS

## 2013-05-12 MED ORDER — OXYCODONE HCL 5 MG PO TABS
5.0000 mg | ORAL_TABLET | Freq: Once | ORAL | Status: AC | PRN
  Administered 2013-05-12: 5 mg via ORAL

## 2013-05-12 MED ORDER — CEFAZOLIN SODIUM-DEXTROSE 2-3 GM-% IV SOLR: 2.0000 g | INTRAVENOUS | Status: DC

## 2013-05-12 MED ORDER — BUPIVACAINE-EPINEPHRINE PF 0.5-1:200000 % IJ SOLN
INTRAMUSCULAR | Status: DC | PRN
  Administered 2013-05-12: 14 mL

## 2013-05-12 MED ORDER — LACTATED RINGERS IV SOLN
INTRAVENOUS | Status: DC
  Administered 2013-05-12 (×2): via INTRAVENOUS

## 2013-05-12 MED ORDER — FENTANYL CITRATE 0.05 MG/ML IJ SOLN
INTRAMUSCULAR | Status: DC | PRN
  Administered 2013-05-12: 100 ug via INTRAVENOUS

## 2013-05-12 MED ORDER — CHLORHEXIDINE GLUCONATE 4 % EX LIQD: 60.0000 mL | Freq: Once | CUTANEOUS | Status: DC

## 2013-05-12 MED ORDER — SUCCINYLCHOLINE CHLORIDE 20 MG/ML IJ SOLN
INTRAMUSCULAR | Status: DC | PRN
  Administered 2013-05-12: 100 mg via INTRAVENOUS

## 2013-05-12 MED ORDER — DEXAMETHASONE SODIUM PHOSPHATE 4 MG/ML IJ SOLN
INTRAMUSCULAR | Status: DC | PRN
  Administered 2013-05-12: 10 mg via INTRAVENOUS

## 2013-05-12 MED ORDER — PROPOFOL 10 MG/ML IV BOLUS
INTRAVENOUS | Status: DC | PRN
  Administered 2013-05-12: 120 mg via INTRAVENOUS
  Administered 2013-05-12 (×2): 20 mg via INTRAVENOUS

## 2013-05-12 MED ORDER — OXYCODONE HCL 5 MG/5ML PO SOLN: 5.0000 mg | Freq: Once | ORAL | Status: AC | PRN

## 2013-05-12 MED ORDER — EPHEDRINE SULFATE 50 MG/ML IJ SOLN
INTRAMUSCULAR | Status: DC | PRN
  Administered 2013-05-12: 15 mg via INTRAVENOUS

## 2013-05-12 MED ORDER — MIDAZOLAM HCL 5 MG/5ML IJ SOLN
INTRAMUSCULAR | Status: DC | PRN
  Administered 2013-05-12: 2 mg via INTRAVENOUS

## 2013-05-12 MED ORDER — LACTATED RINGERS IV SOLN
INTRAVENOUS | Status: DC
  Administered 2013-05-12: 13:00:00 via INTRAVENOUS

## 2013-05-12 SURGICAL SUPPLY — 53 items
APL SKNCLS STERI-STRIP NONHPOA (GAUZE/BANDAGES/DRESSINGS) ×2
BENZOIN TINCTURE PRP APPL 2/3 (GAUZE/BANDAGES/DRESSINGS) ×2 IMPLANT
BLADE SURG 15 STRL LF DISP TIS (BLADE) ×2 IMPLANT
BLADE SURG 15 STRL SS (BLADE) ×3
CANISTER SUCTION 1200CC (MISCELLANEOUS) ×3 IMPLANT
CLOTH BEACON ORANGE TIMEOUT ST (SAFETY) ×3 IMPLANT
DECANTER SPIKE VIAL GLASS SM (MISCELLANEOUS) IMPLANT
DRAPE INCISE IOBAN 66X45 STRL (DRAPES) ×2 IMPLANT
DRAPE PED LAPAROTOMY (DRAPES) ×2 IMPLANT
DRAPE U-SHAPE 47X51 STRL (DRAPES) ×3 IMPLANT
DRAPE U-SHAPE 76X120 STRL (DRAPES) ×1 IMPLANT
DRSG PAD ABDOMINAL 8X10 ST (GAUZE/BANDAGES/DRESSINGS) ×2 IMPLANT
DURAPREP 26ML APPLICATOR (WOUND CARE) ×3 IMPLANT
ELECT REM PT RETURN 9FT ADLT (ELECTROSURGICAL) ×3
ELECTRODE REM PT RTRN 9FT ADLT (ELECTROSURGICAL) ×2 IMPLANT
GAUZE SPONGE 4X4 16PLY XRAY LF (GAUZE/BANDAGES/DRESSINGS) IMPLANT
GAUZE XEROFORM 1X8 LF (GAUZE/BANDAGES/DRESSINGS) IMPLANT
GLOVE BIO SURGEON STRL SZ7.5 (GLOVE) ×2 IMPLANT
GLOVE BIOGEL PI IND STRL 7.0 (GLOVE) ×1 IMPLANT
GLOVE BIOGEL PI IND STRL 8 (GLOVE) ×1 IMPLANT
GLOVE BIOGEL PI INDICATOR 7.0 (GLOVE) ×1
GLOVE BIOGEL PI INDICATOR 8 (GLOVE) ×1
GLOVE ECLIPSE 6.5 STRL STRAW (GLOVE) ×2 IMPLANT
GLOVE EXAM NITRILE LRG STRL (GLOVE) ×2 IMPLANT
GLOVE ORTHO TXT STRL SZ7.5 (GLOVE) ×4 IMPLANT
GOWN BRE IMP PREV XXLGXLNG (GOWN DISPOSABLE) ×1 IMPLANT
GOWN PREVENTION PLUS XLARGE (GOWN DISPOSABLE) ×3 IMPLANT
GOWN PREVENTION PLUS XXLARGE (GOWN DISPOSABLE) ×4 IMPLANT
NEEDLE HYPO 22GX1.5 SAFETY (NEEDLE) IMPLANT
NS IRRIG 1000ML POUR BTL (IV SOLUTION) ×3 IMPLANT
PACK ARTHROSCOPY DSU (CUSTOM PROCEDURE TRAY) ×3 IMPLANT
PACK BASIN DAY SURGERY FS (CUSTOM PROCEDURE TRAY) ×3 IMPLANT
PENCIL BUTTON HOLSTER BLD 10FT (ELECTRODE) ×3 IMPLANT
SLEEVE SCD COMPRESS KNEE MED (MISCELLANEOUS) ×2 IMPLANT
SPONGE GAUZE 4X4 12PLY (GAUZE/BANDAGES/DRESSINGS) ×3 IMPLANT
SPONGE LAP 18X18 X RAY DECT (DISPOSABLE) ×2 IMPLANT
SPONGE LAP 4X18 X RAY DECT (DISPOSABLE) IMPLANT
STAPLER VISISTAT 35W (STAPLE) IMPLANT
STRIP CLOSURE SKIN 1/2X4 (GAUZE/BANDAGES/DRESSINGS) ×2 IMPLANT
SUT MNCRL AB 4-0 PS2 18 (SUTURE) ×2 IMPLANT
SUT PROLENE 3 0 PS 2 (SUTURE) IMPLANT
SUT VIC AB 0 CT1 27 (SUTURE)
SUT VIC AB 0 CT1 27XBRD ANBCTR (SUTURE) IMPLANT
SUT VIC AB 2-0 CT1 27 (SUTURE) ×3
SUT VIC AB 2-0 CT1 TAPERPNT 27 (SUTURE) ×1 IMPLANT
SUT VIC AB 2-0 SH 27 (SUTURE) ×3
SUT VIC AB 2-0 SH 27XBRD (SUTURE) ×1 IMPLANT
SUT VIC AB 3-0 SH 27 (SUTURE)
SUT VIC AB 3-0 SH 27X BRD (SUTURE) IMPLANT
SYR BULB 3OZ (MISCELLANEOUS) ×3 IMPLANT
SYR CONTROL 10ML LL (SYRINGE) IMPLANT
TUBE CONNECTING 20X1/4 (TUBING) ×3 IMPLANT
YANKAUER SUCT BULB TIP NO VENT (SUCTIONS) ×3 IMPLANT

## 2013-05-12 NOTE — Interval H&P Note (Signed)
History and Physical Interval Note:  05/12/2013 7:27 AM  [name]  has presented today for surgery, with the diagnosis of RIGHT HIP TENDONITIS, TROCHANTERIC BURSITIS    The various methods of treatment have been discussed with the patient and family. After consideration of risks, benefits and other options for treatment, the patient has consented to  Procedure(s): EXCISION RIGHT TROCHANTERIC BURSA/CALCIFICATION  (Right) RIGHT FASCIOTOMY HIP ANY TYPE (Right) RIGHT TENOTOMY ABDUCTORS EXTENSORS HIP OPEN  (Right) as a surgical intervention .  The patient's history has been reviewed, patient examined, no change in status, stable for surgery.  I have reviewed the patient's chart and labs.  Questions were answered to the patient's satisfaction.     [name] F

## 2013-05-12 NOTE — Anesthesia Preprocedure Evaluation (Signed)
Anesthesia Evaluation  Patient identified by MRN, date of birth, ID band Patient awake    Reviewed: Allergy & Precautions, H&P , NPO status , Patient's Chart, lab work & pertinent test results  Airway Mallampati: I TM Distance: >3 FB Neck ROM: Full    Dental  (+) Teeth Intact and Dental Advisory Given   Pulmonary  breath sounds clear to auscultation        Cardiovascular Rhythm:Regular Rate:Normal     Neuro/Psych    GI/Hepatic   Endo/Other    Renal/GU      Musculoskeletal   Abdominal   Peds  Hematology   Anesthesia Other Findings   Reproductive/Obstetrics                           Anesthesia Physical Anesthesia Plan  ASA: I  Anesthesia Plan: General   Post-op Pain Management:    Induction: Intravenous  Airway Management Planned: Oral ETT  Additional Equipment:   Intra-op Plan:   Post-operative Plan:   Informed Consent: I have reviewed the patients History and Physical, chart, labs and discussed the procedure including the risks, benefits and alternatives for the proposed anesthesia with the patient or authorized representative who has indicated his/her understanding and acceptance.   Dental advisory given  Plan Discussed with: CRNA and Surgeon  Anesthesia Plan Comments:         Anesthesia Quick Evaluation

## 2013-05-12 NOTE — Anesthesia Procedure Notes (Signed)
Procedure Name: Intubation Performed by: Can Lucci, Tri[place] Pre-anesthesia Checklist: Patient identified, Emergency Drugs available, Suction available and Patient being monitored Patient Re-evaluated:Patient Re-evaluated prior to inductionOxygen Delivery Method: Circle System Utilized Preoxygenation: Pre-oxygenation with 100% oxygen Intubation Type: IV induction Ventilation: Mask ventilation without difficulty Laryngoscope Size: Mac and 3 Grade View: Grade II Tube type: Oral Tube size: 7.0 mm Number of attempts: 1 Airway Equipment and Method: stylet and oral airway Placement Confirmation: ETT inserted through vocal cords under direct vision,  positive ETCO2 and breath sounds checked- equal and bilateral Tube secured with: Tape Dental Injury: Teeth and Oropharynx as per pre-operative assessment      

## 2013-05-12 NOTE — Transfer of Care (Signed)
Immediate Anesthesia Transfer of Care Note  Patient: [name]  Procedure(s) Performed: Procedure(s): EXCISION RIGHT TROCHANTERIC BURSA/CALCIFICATION  (Right) RIGHT FASCIOTOMY HIP ANY TYPE (Right)  Patient Location: PACU  Anesthesia Type:General  Level of Consciousness: awake, oriented and patient cooperative  Airway & Oxygen Therapy: Patient Spontanous Breathing and Patient connected to face mask oxygen  Post-op Assessment: Report given to PACU RN and Post -op Vital signs reviewed and stable  Post vital signs: Reviewed and stable  Complications: No apparent anesthesia complications

## 2013-05-12 NOTE — Anesthesia Postprocedure Evaluation (Signed)
  Anesthesia Post-op Note  Patient: [name]  Procedure(s) Performed: Procedure(s): EXCISION RIGHT TROCHANTERIC BURSA/CALCIFICATION  (Right) RIGHT FASCIOTOMY HIP ANY TYPE (Right)  Patient Location: PACU  Anesthesia Type:General  Level of Consciousness: awake, alert  and oriented  Airway and Oxygen Therapy: Patient Spontanous Breathing  Post-op Pain: mild  Post-op Assessment: Post-op Vital signs reviewed, Patient's Cardiovascular Status Stable, Respiratory Function Stable, Patent Airway and No signs of Nausea or vomiting  Post-op Vital Signs: Reviewed and stable  Complications: No apparent anesthesia complications

## 2013-05-13 ENCOUNTER — Encounter (HOSPITAL_BASED_OUTPATIENT_CLINIC_OR_DEPARTMENT_OTHER): Payer: Self-pay | Admitting: Orthopedic Surgery

## 2013-05-13 NOTE — Op Note (Signed)
[name], [name]               ACCOUNT NO.:  [id number]  MEDICAL RECORD NO.:  [id number]  LOCATION:                               FACILITY:  [facility]  PHYSICIAN:  Loreta Ave, M.D. DATE OF BIRTH:  Feb 19, 1946  DATE OF PROCEDURE:  05/12/2013 DATE OF DISCHARGE:  05/12/2013                              OPERATIVE REPORT   PREOPERATIVE DIAGNOSIS:  Recalcitrant greater trochanteric bursitis with contracture of iliotibial band, lateral aspect, right hip.  Status post right total hip replacement.  POSTOPERATIVE DIAGNOSIS:  Recalcitrant greater trochanteric bursitis with contracture of iliotibial band, lateral aspect, right hip.  Status post right total hip replacement without any evidence of reaction from previous prosthesis.  No abductor tendon tears.  PROCEDURE:  Right hip lateral exploration.  Release of iliotibial band. Excision of trochanteric bursa and removal of permanent FiberWire from lateral femur.  SURGEON:  Loreta Ave, M.D.  ASSISTANT:  Margarita Rana, MD  ANESTHESIA:  General.  BLOOD LOSS:  Minimal.  SPECIMENS:  None.  CULTURES:  None.  COMPLICATION:  None.  DRESSINGS:  Soft compressive.  PROCEDURE IN DETAIL:  The patient was brought to the operating room and placed on the operating table in supine position.  Once adequate anesthesia had been obtained, turned to the lateral position with appropriate padding and support.  Prepped and draped in usual sterile fashion.  I used her previous incision, opened longitudinally over the central part.  Skin and subcutaneous tissue were divided.  Hemostasis with cautery.  Iliotibial band was incised longitudinally from well above to well below the trochanter.  A cruciate incision then made right over the trochanter with excision of the very margins of the band to complete decompression.  A reactive bursa with fluid below this excised in its entirety.  The FiberWire used for the repair of the external rotators was  little prominent and I was able to completely excise this as that suture was placed for something that is now well-healed.  Once that was all complete, I was able to look down posteriorly all the way down the capsule.  There was no evidence of inflammatory debris, reactive tissue anywhere near the neck shaft interface.  I did not open the capsule itself, but I did not feel as indicated.  Wound was thoroughly irrigated.  Iliotibial band left open.  Subcutaneous and subcuticular closure.  Margins were injected with Marcaine.  Steri-Strips applied.  Brought to the recovery room.  Tolerated the surgery well.  No complications.     Loreta Ave, M.D.     DFM/MEDQ  D:  05/12/2013  T:  05/13/2013  Job:  [id number]

## 2013-10-13 ENCOUNTER — Other Ambulatory Visit: Payer: Self-pay | Admitting: Neurosurgery

## 2013-10-13 DIAGNOSIS — M5136 Other intervertebral disc degeneration, lumbar region: Secondary | ICD-10-CM

## 2013-10-19 ENCOUNTER — Ambulatory Visit
Admission: RE | Admit: 2013-10-19 | Discharge: 2013-10-19 | Disposition: A | Discharge status: Home / Self Care | Payer: Medicare Other | Source: Ambulatory Visit | Attending: Neurosurgery | Admitting: Neurosurgery

## 2013-10-19 VITALS — BP 107/61 | HR 101

## 2013-10-19 DIAGNOSIS — M5136 Other intervertebral disc degeneration, lumbar region: Secondary | ICD-10-CM

## 2013-10-19 MED ORDER — ONDANSETRON HCL 8 MG PO TABS
8.0000 mg | ORAL_TABLET | Freq: Once | ORAL | Status: AC
  Administered 2013-10-19: 8 mg via ORAL

## 2013-10-19 MED ORDER — MEPERIDINE HCL 100 MG/ML IJ SOLN
75.0000 mg | Freq: Once | INTRAMUSCULAR | Status: AC
  Administered 2013-10-19: 75 mg via INTRAMUSCULAR

## 2013-10-19 MED ORDER — DIAZEPAM 5 MG PO TABS
5.0000 mg | ORAL_TABLET | Freq: Once | ORAL | Status: AC
Start: 2013-10-19 — End: 2013-10-19
  Administered 2013-10-19: 5 mg via ORAL

## 2013-10-19 MED ORDER — DIPHENHYDRAMINE HCL 25 MG PO CAPS
25.0000 mg | ORAL_CAPSULE | Freq: Once | ORAL | Status: AC
  Administered 2013-10-19: 25 mg via ORAL

## 2013-10-19 MED ORDER — ONDANSETRON HCL 4 MG/2ML IJ SOLN
4.0000 mg | Freq: Once | INTRAMUSCULAR | Status: AC
  Administered 2013-10-19: 4 mg via INTRAMUSCULAR

## 2013-10-19 MED ORDER — IOHEXOL 180 MG/ML  SOLN
17.0000 mL | Freq: Once | INTRAMUSCULAR | Status: AC | PRN
  Administered 2013-10-19: 17 mL via INTRATHECAL

## 2013-10-19 NOTE — Discharge Instructions (Signed)

## 2013-11-01 ENCOUNTER — Ambulatory Visit: Payer: Medicare Other | Admitting: Nurse Practitioner

## 2013-11-08 ENCOUNTER — Other Ambulatory Visit: Payer: Self-pay | Admitting: Nurse Practitioner

## 2013-11-08 ENCOUNTER — Telehealth: Payer: Self-pay | Admitting: Nurse Practitioner

## 2013-11-08 DIAGNOSIS — I714 Abdominal aortic aneurysm, without rupture, unspecified: Secondary | ICD-10-CM

## 2013-11-08 NOTE — Telephone Encounter (Signed)
Patient was seen by Dr. Dorcas Carrow neurosurgeon with [facility]. She had an myelogram done & they found a abdominal aorta that is 2.7 cm & he is recommending surgery. Patient has been advised that she has to have medical clearance from a cardiac surgeon. Patient is requesting a referral to Dr. Lanelle Bal at [facility], [place].

## 2013-11-10 NOTE — Telephone Encounter (Signed)
Appt 11/17/13, patient aware

## 2013-11-16 NOTE — Progress Notes (Signed)
Dr. Servando Snare recommended patient to be seen at [facility]. Appointment has been made.

## 2013-11-17 ENCOUNTER — Encounter: Payer: Medicare Other | Admitting: Cardiothoracic Surgery

## 2013-11-18 DIAGNOSIS — M5416 Radiculopathy, lumbar region: Secondary | ICD-10-CM | POA: Insufficient documentation

## 2013-11-28 ENCOUNTER — Encounter: Payer: Self-pay | Admitting: Vascular Surgery

## 2013-11-29 ENCOUNTER — Ambulatory Visit: Payer: Medicare Other | Admitting: Internal Medicine

## 2013-11-29 ENCOUNTER — Ambulatory Visit (INDEPENDENT_AMBULATORY_CARE_PROVIDER_SITE_OTHER): Payer: Medicare Other | Admitting: Vascular Surgery

## 2013-11-29 ENCOUNTER — Encounter: Payer: Self-pay | Admitting: Vascular Surgery

## 2013-11-29 VITALS — BP 132/88 | HR 95 | Ht 65.0 in | Wt 143.2 lb

## 2013-11-29 DIAGNOSIS — I714 Abdominal aortic aneurysm, without rupture, unspecified: Secondary | ICD-10-CM

## 2013-11-29 NOTE — Addendum Note (Signed)
Addended by: Dorthula Rue L on: 11/29/2013 12:08 PM   Modules accepted: Orders

## 2013-11-29 NOTE — Progress Notes (Signed)
Subjective:     Patient ID: [name], female   DOB: 1946-04-03, 68 y.o.   MRN: [id number]  HPI this 68 year old female is referred by Dr. Joya Salm for a small abdominal aortic aneurysm which was discovered on recent CT of the lumbar spine. Patient has a radiculopathy and will require lumbar spine surgery in the near future. The aorta was visualized on the CT scan it measured 2.7 cm in maximum diameter. She does have a family history of ruptured aortic aneurysm in her grandmother and mother. She has no previous knowledge of aneurysm. She does have a history of tobacco abuse of greater than one half pack per day for approximately 50 years.  Past Medical History  Diagnosis Date  . Chicken pox   . Mitral valve prolapse   . Contact lens/glasses fitting     wears contacts or glasses  . IBS (irritable bowel syndrome)   . Seasonal allergies   . Arthritis   . DDD (degenerative disc disease), lumbar     History  Substance Use Topics  . Smoking status: Current Every Day Smoker -- 0.50 packs/day for 30 years    Types: Cigarettes  . Smokeless tobacco: Never Used  . Alcohol Use: Yes     Comment: very occasional, not monthly    Family History  Problem Relation Age of Onset  . Early death Mother     aortic aneurysm  . Vasculitis Mother     temporal arteritis  . Aortic aneurysm Mother   . Heart disease Mother     before age 41  . Hyperlipidemia Mother   . Hypertension Mother   . COPD Sister   . Hypertension Sister   . Mental illness Sister   . Depression Sister   . Heart disease Sister     before age 63  . Cancer Maternal Aunt 84    brain  . Heart disease Maternal Grandmother     aortic aneurysm  . Aortic aneurysm Maternal Grandmother   . Hypertension Sister     No Known Allergies  Current outpatient prescriptions:cetirizine (ZYRTEC) 10 MG tablet, Take 10 mg by mouth daily., Disp: , Rfl: ;  HYDROcodone-acetaminophen (NORCO) 10-325 MG per tablet, , Disp: , Rfl: ;   Cholecalciferol (VITAMIN D-3 PO), Take 50,000 Units by mouth once a week., Disp: , Rfl: ;  diphenhydrAMINE (BENADRYL) 25 MG tablet, Take 2 tablets (50 mg total) by mouth every 8 (eight) hours as needed for itching., Disp: 20 tablet, Rfl: 0  BP 132/88  Pulse 95  Ht 5\' 5"  (1.651 m)  Wt 143 lb 3.2 oz (64.955 kg)  BMI 23.83 kg/m2  SpO2 100%  Body mass index is 23.83 kg/(m^2).            Review of Systems denies chest pain, dyspnea on exertion, PND, orthopnea, hemoptysis, lateralizing weakness, aphasia, amaurosis fugax. Does complain of discomfort in her right leg with ambulation from her back. All other systems negative complete review of systems     Objective:   Physical Exam BP 132/88  Pulse 95  Ht 5\' 5"  (1.651 m)  Wt 143 lb 3.2 oz (64.955 kg)  BMI 23.83 kg/m2  SpO2 100% Gen.-alert and oriented x3 in no apparent distress HEENT normal for age Lungs no rhonchi or wheezing Cardiovascular regular rhythm no murmurs carotid pulses 3+ palpable no bruits audible Abdomen soft nontender no palpable masses Musculoskeletal free of  major deformities Skin clear -no rashes Neurologic normal Lower extremities 3+ femoral and dorsalis pedis  pulses palpable bilaterally with no edema  Today I reviewed the CT scan of the lumbar spine which revealed the dilated aorta and agree that she does have a 2.7 cm distal abdominal aorta with normal sized iliac arteries       Assessment:     Focal dilatation of terminal aorta-2.7 cm in patient with family history of ruptured aortic aneurysm and mother and grandmother    Plan:     Unlikely this small aortic aneurysmal ever require treatment. We will see her back in one year with duplex scan for monitoring purposes and then probably follow this every 2 years Discussed  this with her and reassured her regarding the

## 2013-12-22 ENCOUNTER — Other Ambulatory Visit: Payer: Self-pay | Admitting: Neurosurgery

## 2013-12-28 ENCOUNTER — Encounter (HOSPITAL_COMMUNITY): Payer: Self-pay | Admitting: Pharmacy Technician

## 2013-12-29 ENCOUNTER — Encounter (HOSPITAL_COMMUNITY): Payer: Self-pay | Admitting: *Deleted

## 2014-01-05 MED ORDER — CEFAZOLIN SODIUM-DEXTROSE 2-3 GM-% IV SOLR
2.0000 g | INTRAVENOUS | Status: AC
  Administered 2014-01-06: 2 g via INTRAVENOUS
  Filled 2014-01-05: qty 50

## 2014-01-05 NOTE — H&P (Signed)
[name] is an 68 y.o. female.   Chief Complaint: chronic lumbar pain HPI: patient who had bilateral hip surgery with repair of ligaments. Nevertheless she continued to have pain in both legs right worse than left all the way down to the foot. Patient has failed with conservative treatment including epidurals. She had an outpatient myelogram.unable to sit, pain with walking.  Past Medical History  Diagnosis Date  . Chicken pox   . Mitral valve prolapse   . Contact lens/glasses fitting     wears contacts or glasses  . IBS (irritable bowel syndrome)     diet contolled  . Seasonal allergies   . Arthritis   . DDD (degenerative disc disease), lumbar   . AAA (abdominal aortic aneurysm)     saw Dr Kellie Simmering 11/2013.  2.7 cm    Past Surgical History  Procedure Laterality Date  . Tonsillectomy and adenoidectomy  1950  . Vaginal hysterectomy  1993  . Total hip arthroplasty Left 2006    lt total hip  . Breast biopsy  1989,05    lt br bx  . Cholecystectomy    . Appendectomy    . Total hip arthroplasty  2011    rt total hip  . Tonsillectomy    . Excision/release bursa hip Right 05/12/2013    Procedure: EXCISION RIGHT TROCHANTERIC BURSA/CALCIFICATION ;  Surgeon: Ninetta Lights, MD;  Location: [facility];  Service: Orthopedics;  Laterality: Right;  . Fasciotomy Right 05/12/2013    Procedure: RIGHT FASCIOTOMY HIP ANY TYPE;  Surgeon: Ninetta Lights, MD;  Location: [facility];  Service: Orthopedics;  Laterality: Right;  . Joint replacement    . Back surgery N/A 16,10,96    3 back surgeries,    Family History  Problem Relation Age of Onset  . Early death Mother     aortic aneurysm  . Vasculitis Mother     temporal arteritis  . Aortic aneurysm Mother   . Heart disease Mother     before age 27  . Hyperlipidemia Mother   . Hypertension Mother   . COPD Sister   . Hypertension Sister   . Mental illness Sister   . Depression Sister   . Heart disease  Sister     before age 32  . Cancer Maternal Aunt 84    brain  . Heart disease Maternal Grandmother     aortic aneurysm  . Aortic aneurysm Maternal Grandmother   . Hypertension Sister    Social History:  reports that she has been smoking Cigarettes.  She has a 15 pack-year smoking history. She has never used smokeless tobacco. She reports that she drinks alcohol. She reports that she does not use illicit drugs.  Allergies: No Known Allergies  No prescriptions prior to admission    No results found for this or any previous visit (from the past 48 hour(s)). No results found.  Review of Systems  Constitutional: Negative.   HENT: Negative.   Eyes: Negative.   Respiratory: Negative.   Cardiovascular: Negative.   Gastrointestinal: Negative.   Genitourinary: Negative.   Musculoskeletal: Positive for back pain.  Skin: Positive for itching.  Neurological: Positive for focal weakness.  Endo/Heme/Allergies: Negative.   Psychiatric/Behavioral: Negative.     There were no vitals taken for this visit. Physical Exam hent, nl. Neck, nl. Cv,nl. Lungs, clear. Abdomen, soft. Extremities, scar in both hips. NEURO weakness of DF left foot. Dtr, nl. SLR positive on the left at 80  degrees and right at 30. The myelogram shows severe degenerative disc disease at lumbar 2-3, 3-4 and 4-5.   Assessment/Plan Patient to go ahead with decompression and fusion from l2 to l5. She is fully aware of risks and benefits  Floyce Stakes 01/05/2014, 6:31 PM

## 2014-01-06 ENCOUNTER — Inpatient Hospital Stay (HOSPITAL_COMMUNITY): Payer: Medicare Other

## 2014-01-06 ENCOUNTER — Inpatient Hospital Stay (HOSPITAL_COMMUNITY): Payer: Medicare Other | Admitting: Anesthesiology

## 2014-01-06 ENCOUNTER — Encounter (HOSPITAL_COMMUNITY): Payer: Medicare Other | Admitting: Anesthesiology

## 2014-01-06 ENCOUNTER — Encounter: Payer: Self-pay | Admitting: Nurse Practitioner

## 2014-01-06 ENCOUNTER — Encounter (HOSPITAL_COMMUNITY)
Admission: RE | Disposition: A | Discharge status: Home / Self Care | Payer: Medicare Other | Source: Ambulatory Visit | Attending: Neurosurgery

## 2014-01-06 ENCOUNTER — Inpatient Hospital Stay (HOSPITAL_COMMUNITY)
Admission: RE | Admit: 2014-01-06 | Discharge: 2014-01-08 | DRG: 460 | Disposition: A | Discharge status: Home / Self Care | Payer: Medicare Other | Source: Ambulatory Visit | Attending: Neurosurgery | Admitting: Neurosurgery

## 2014-01-06 DIAGNOSIS — M549 Dorsalgia, unspecified: Secondary | ICD-10-CM | POA: Insufficient documentation

## 2014-01-06 DIAGNOSIS — M5137 Other intervertebral disc degeneration, lumbosacral region: Principal | ICD-10-CM | POA: Diagnosis present

## 2014-01-06 DIAGNOSIS — M51379 Other intervertebral disc degeneration, lumbosacral region without mention of lumbar back pain or lower extremity pain: Principal | ICD-10-CM | POA: Diagnosis present

## 2014-01-06 DIAGNOSIS — D62 Acute posthemorrhagic anemia: Secondary | ICD-10-CM | POA: Diagnosis not present

## 2014-01-06 DIAGNOSIS — M51369 Other intervertebral disc degeneration, lumbar region without mention of lumbar back pain or lower extremity pain: Secondary | ICD-10-CM | POA: Diagnosis present

## 2014-01-06 DIAGNOSIS — F17200 Nicotine dependence, unspecified, uncomplicated: Secondary | ICD-10-CM | POA: Diagnosis present

## 2014-01-06 DIAGNOSIS — M5136 Other intervertebral disc degeneration, lumbar region: Secondary | ICD-10-CM | POA: Diagnosis present

## 2014-01-06 HISTORY — DX: Abdominal aortic aneurysm, without rupture, unspecified: I71.40

## 2014-01-06 HISTORY — DX: Abdominal aortic aneurysm, without rupture: I71.4

## 2014-01-06 LAB — BASIC METABOLIC PANEL
BUN: 5 mg/dL — AB (ref 6–23)
CALCIUM: 9.3 mg/dL (ref 8.4–10.5)
CO2: 24 mEq/L (ref 19–32)
Chloride: 101 mEq/L (ref 96–112)
Creatinine, Ser: 0.69 mg/dL (ref 0.50–1.10)
GFR calc Af Amer: >90 mL/min (ref >90)
GFR, EST NON AFRICAN AMERICAN: 87 mL/min — AB (ref >90)
GLUCOSE: 99 mg/dL (ref 70–99)
Potassium: 4.3 mEq/L (ref 3.7–5.3)
Sodium: 139 mEq/L (ref 137–147)

## 2014-01-06 LAB — CBC
HCT: 43.6 % (ref 36.0–46.0)
Hemoglobin: 15.4 g/dL — ABNORMAL HIGH (ref 12.0–15.0)
MCH: 34.5 pg — ABNORMAL HIGH (ref 26.0–34.0)
MCHC: 35.3 g/dL (ref 30.0–36.0)
MCV: 97.8 fL (ref 78.0–100.0)
PLATELETS: 221 10*3/uL (ref 150–400)
RBC: 4.46 MIL/uL (ref 3.87–5.11)
RDW: 14.5 % (ref 11.5–15.5)
WBC: 8 10*3/uL (ref 4.0–10.5)

## 2014-01-06 LAB — POCT I-STAT 4, (NA,K, GLUC, HGB,HCT)
GLUCOSE: 108 mg/dL — AB (ref 70–99)
HCT: 37 % (ref 36.0–46.0)
Hemoglobin: 12.6 g/dL (ref 12.0–15.0)
POTASSIUM: 4 meq/L (ref 3.7–5.3)
Sodium: 141 mEq/L (ref 137–147)

## 2014-01-06 LAB — SURGICAL PCR SCREEN
MRSA, PCR: NEGATIVE
Staphylococcus aureus: NEGATIVE

## 2014-01-06 SURGERY — POSTERIOR LUMBAR FUSION 3 LEVEL
Anesthesia: General

## 2014-01-06 MED ORDER — THROMBIN 5000 UNITS EX SOLR
OROMUCOSAL | Status: DC | PRN
  Administered 2014-01-06: 12:00:00 via TOPICAL

## 2014-01-06 MED ORDER — BUPIVACAINE LIPOSOME 1.3 % IJ SUSP
INTRAMUSCULAR | Status: DC | PRN
  Administered 2014-01-06: 20 mL

## 2014-01-06 MED ORDER — SODIUM CHLORIDE 0.9 % IV SOLN
INTRAVENOUS | Status: DC | PRN
  Administered 2014-01-06: 15:00:00 via INTRAVENOUS

## 2014-01-06 MED ORDER — ROCURONIUM BROMIDE 50 MG/5ML IV SOLN
INTRAVENOUS | Status: AC
  Filled 2014-01-06: qty 1

## 2014-01-06 MED ORDER — PHENYLEPHRINE HCL 10 MG/ML IJ SOLN
INTRAMUSCULAR | Status: AC
  Filled 2014-01-06: qty 1

## 2014-01-06 MED ORDER — FENTANYL CITRATE 0.05 MG/ML IJ SOLN
25.0000 ug | INTRAMUSCULAR | Status: DC | PRN
  Administered 2014-01-06 (×2): 25 ug via INTRAVENOUS
  Administered 2014-01-06: 50 ug via INTRAVENOUS

## 2014-01-06 MED ORDER — NALOXONE HCL 0.4 MG/ML IJ SOLN: 0.4000 mg | INTRAMUSCULAR | Status: DC | PRN

## 2014-01-06 MED ORDER — OXYCODONE-ACETAMINOPHEN 5-325 MG PO TABS
2.0000 | ORAL_TABLET | ORAL | Status: DC | PRN
  Administered 2014-01-06 – 2014-01-08 (×7): 2 via ORAL
  Filled 2014-01-06 (×7): qty 2

## 2014-01-06 MED ORDER — PROPOFOL 10 MG/ML IV BOLUS
INTRAVENOUS | Status: AC
  Filled 2014-01-06: qty 20

## 2014-01-06 MED ORDER — SODIUM CHLORIDE 0.9 % IJ SOLN
3.0000 mL | Freq: Two times a day (BID) | INTRAMUSCULAR | Status: DC
  Administered 2014-01-06 – 2014-01-07 (×3): 3 mL via INTRAVENOUS

## 2014-01-06 MED ORDER — FENTANYL CITRATE 0.05 MG/ML IJ SOLN
INTRAMUSCULAR | Status: AC
  Filled 2014-01-06: qty 5

## 2014-01-06 MED ORDER — DIPHENHYDRAMINE HCL 12.5 MG/5ML PO ELIX: 12.5000 mg | ORAL_SOLUTION | Freq: Four times a day (QID) | ORAL | Status: DC | PRN

## 2014-01-06 MED ORDER — PHENYLEPHRINE HCL 10 MG/ML IJ SOLN
INTRAMUSCULAR | Status: AC
  Filled 2014-01-06: qty 2

## 2014-01-06 MED ORDER — EPHEDRINE SULFATE 50 MG/ML IJ SOLN
INTRAMUSCULAR | Status: AC
  Filled 2014-01-06: qty 1

## 2014-01-06 MED ORDER — MORPHINE SULFATE (PF) 1 MG/ML IV SOLN
INTRAVENOUS | Status: DC
  Administered 2014-01-06: 5.1 mg via INTRAVENOUS
  Administered 2014-01-06 – 2014-01-07 (×3): via INTRAVENOUS
  Filled 2014-01-06: qty 25

## 2014-01-06 MED ORDER — SODIUM CHLORIDE 0.9 % IJ SOLN: 3.0000 mL | INTRAMUSCULAR | Status: DC | PRN

## 2014-01-06 MED ORDER — MENTHOL 3 MG MT LOZG: 1.0000 | LOZENGE | OROMUCOSAL | Status: DC | PRN

## 2014-01-06 MED ORDER — SODIUM CHLORIDE 0.9 % IV SOLN
INTRAVENOUS | Status: DC
  Administered 2014-01-06 (×2): 180 mL/h via INTRAVENOUS
  Filled 2014-01-06: qty 500

## 2014-01-06 MED ORDER — PROPOFOL 10 MG/ML IV BOLUS
INTRAVENOUS | Status: DC | PRN
  Administered 2014-01-06: 50 mg via INTRAVENOUS
  Administered 2014-01-06: 150 mg via INTRAVENOUS

## 2014-01-06 MED ORDER — ALBUMIN HUMAN 5 % IV SOLN
INTRAVENOUS | Status: DC | PRN
  Administered 2014-01-06 (×3): via INTRAVENOUS

## 2014-01-06 MED ORDER — DIAZEPAM 5 MG PO TABS
5.0000 mg | ORAL_TABLET | Freq: Four times a day (QID) | ORAL | Status: DC | PRN
  Administered 2014-01-06 – 2014-01-07 (×4): 5 mg via ORAL
  Filled 2014-01-06 (×5): qty 1

## 2014-01-06 MED ORDER — LIDOCAINE HCL (CARDIAC) 20 MG/ML IV SOLN
INTRAVENOUS | Status: DC | PRN
  Administered 2014-01-06: 60 mg via INTRAVENOUS

## 2014-01-06 MED ORDER — ONDANSETRON HCL 4 MG/2ML IJ SOLN
4.0000 mg | Freq: Once | INTRAMUSCULAR | Status: AC
  Administered 2014-01-06: 4 mg via INTRAVENOUS

## 2014-01-06 MED ORDER — PHENYLEPHRINE HCL 10 MG/ML IJ SOLN
10.0000 mg | INTRAVENOUS | Status: DC | PRN
  Administered 2014-01-06: 40 ug/min via INTRAVENOUS

## 2014-01-06 MED ORDER — LACTATED RINGERS IV SOLN
INTRAVENOUS | Status: DC | PRN
  Administered 2014-01-06 (×4): via INTRAVENOUS

## 2014-01-06 MED ORDER — MIDAZOLAM HCL 2 MG/2ML IJ SOLN
INTRAMUSCULAR | Status: AC
  Filled 2014-01-06: qty 2

## 2014-01-06 MED ORDER — BUPIVACAINE LIPOSOME 1.3 % IJ SUSP
20.0000 mL | Freq: Once | INTRAMUSCULAR | Status: DC
  Filled 2014-01-06: qty 20

## 2014-01-06 MED ORDER — LIDOCAINE HCL (CARDIAC) 20 MG/ML IV SOLN
INTRAVENOUS | Status: AC
  Filled 2014-01-06: qty 5

## 2014-01-06 MED ORDER — ACETAMINOPHEN 650 MG RE SUPP: 650.0000 mg | RECTAL | Status: DC | PRN

## 2014-01-06 MED ORDER — CEFAZOLIN SODIUM 1-5 GM-% IV SOLN
1.0000 g | Freq: Three times a day (TID) | INTRAVENOUS | Status: AC
  Administered 2014-01-06 – 2014-01-07 (×2): 1 g via INTRAVENOUS
  Filled 2014-01-06 (×3): qty 50

## 2014-01-06 MED ORDER — LACTATED RINGERS IV SOLN
INTRAVENOUS | Status: DC
  Administered 2014-01-06: 10:00:00 via INTRAVENOUS

## 2014-01-06 MED ORDER — SENNA 8.6 MG PO TABS
1.0000 | ORAL_TABLET | Freq: Two times a day (BID) | ORAL | Status: DC
  Administered 2014-01-07 – 2014-01-08 (×4): 8.6 mg via ORAL
  Filled 2014-01-06 (×6): qty 1

## 2014-01-06 MED ORDER — SODIUM CHLORIDE 0.9 % IJ SOLN: 9.0000 mL | INTRAMUSCULAR | Status: DC | PRN

## 2014-01-06 MED ORDER — DIPHENHYDRAMINE HCL 50 MG/ML IJ SOLN
12.5000 mg | Freq: Four times a day (QID) | INTRAMUSCULAR | Status: DC | PRN
Start: 2014-01-06 — End: 2014-01-07

## 2014-01-06 MED ORDER — EPHEDRINE SULFATE 50 MG/ML IJ SOLN
INTRAMUSCULAR | Status: DC | PRN
  Administered 2014-01-06: 15 mg via INTRAVENOUS
  Administered 2014-01-06: 10 mg via INTRAVENOUS

## 2014-01-06 MED ORDER — FENTANYL CITRATE 0.05 MG/ML IJ SOLN
INTRAMUSCULAR | Status: DC | PRN
  Administered 2014-01-06 (×2): 75 ug via INTRAVENOUS
  Administered 2014-01-06 (×2): 50 ug via INTRAVENOUS

## 2014-01-06 MED ORDER — SODIUM CHLORIDE 0.9 % IV SOLN: 250.0000 mL | INTRAVENOUS | Status: DC

## 2014-01-06 MED ORDER — ONDANSETRON HCL 4 MG/2ML IJ SOLN
4.0000 mg | Freq: Four times a day (QID) | INTRAMUSCULAR | Status: DC | PRN
  Filled 2014-01-06: qty 2

## 2014-01-06 MED ORDER — MIDAZOLAM HCL 5 MG/5ML IJ SOLN
INTRAMUSCULAR | Status: DC | PRN
  Administered 2014-01-06: 2 mg via INTRAVENOUS

## 2014-01-06 MED ORDER — THROMBIN 20000 UNITS EX SOLR
CUTANEOUS | Status: DC | PRN
  Administered 2014-01-06 (×2): via TOPICAL

## 2014-01-06 MED ORDER — LORATADINE 10 MG PO TABS
10.0000 mg | ORAL_TABLET | Freq: Every day | ORAL | Status: DC
  Administered 2014-01-06 – 2014-01-08 (×3): 10 mg via ORAL
  Filled 2014-01-06 (×3): qty 1

## 2014-01-06 MED ORDER — ACETAMINOPHEN 325 MG PO TABS: 650.0000 mg | ORAL_TABLET | ORAL | Status: DC | PRN

## 2014-01-06 MED ORDER — PHENYLEPHRINE HCL 10 MG/ML IJ SOLN
INTRAMUSCULAR | Status: DC | PRN
  Administered 2014-01-06 (×4): 40 ug via INTRAVENOUS

## 2014-01-06 MED ORDER — FENTANYL CITRATE 0.05 MG/ML IJ SOLN
INTRAMUSCULAR | Status: AC
  Filled 2014-01-06: qty 2

## 2014-01-06 MED ORDER — PHENOL 1.4 % MT LIQD: 1.0000 | OROMUCOSAL | Status: DC | PRN

## 2014-01-06 MED ORDER — ONDANSETRON HCL 4 MG/2ML IJ SOLN: 4.0000 mg | INTRAMUSCULAR | Status: DC | PRN

## 2014-01-06 MED ORDER — ROCURONIUM BROMIDE 100 MG/10ML IV SOLN
INTRAVENOUS | Status: DC | PRN
  Administered 2014-01-06: 20 mg via INTRAVENOUS
  Administered 2014-01-06: 50 mg via INTRAVENOUS

## 2014-01-06 MED ORDER — SODIUM CHLORIDE 0.9 % IV SOLN
INTRAVENOUS | Status: DC
  Administered 2014-01-06: 19:00:00 via INTRAVENOUS

## 2014-01-06 MED ORDER — ONDANSETRON HCL 4 MG/2ML IJ SOLN
INTRAMUSCULAR | Status: AC
  Administered 2014-01-06: 4 mg
  Filled 2014-01-06: qty 2

## 2014-01-06 MED ORDER — MORPHINE SULFATE 2 MG/ML IJ SOLN
1.0000 mg | INTRAMUSCULAR | Status: DC | PRN
  Administered 2014-01-06: 2 mg via INTRAVENOUS
  Filled 2014-01-06: qty 1

## 2014-01-06 MED ORDER — MUPIROCIN 2 % EX OINT
TOPICAL_OINTMENT | Freq: Once | CUTANEOUS | Status: AC
  Administered 2014-01-06: 1 via NASAL
  Filled 2014-01-06 (×2): qty 22

## 2014-01-06 MED ORDER — 0.9 % SODIUM CHLORIDE (POUR BTL) OPTIME
TOPICAL | Status: DC | PRN
  Administered 2014-01-06 (×2): 1000 mL

## 2014-01-06 MED ORDER — ZOLPIDEM TARTRATE 5 MG PO TABS: 5.0000 mg | ORAL_TABLET | Freq: Every evening | ORAL | Status: DC | PRN

## 2014-01-06 SURGICAL SUPPLY — 83 items
APL SKNCLS STERI-STRIP NONHPOA (GAUZE/BANDAGES/DRESSINGS) ×1
BENZOIN TINCTURE PRP APPL 2/3 (GAUZE/BANDAGES/DRESSINGS) ×3 IMPLANT
BLADE SURG ROTATE 9660 (MISCELLANEOUS) IMPLANT
BUR ACORN 6.0 (BURR) ×2 IMPLANT
BUR ACORN 6.0MM (BURR) ×1
BUR MATCHSTICK NEURO 3.0 LAGG (BURR) ×5 IMPLANT
CANISTER SUCT 3000ML (MISCELLANEOUS) ×3 IMPLANT
CAP REVERE LOCKING (Cap) ×16 IMPLANT
CLOSURE WOUND 1/2 X4 (GAUZE/BANDAGES/DRESSINGS) ×2
CONN CROSSLINK REV 6.35 48-60 (Connector) ×3 IMPLANT
CONNECTOR CRSLNK REV6.35 48-60 (Connector) IMPLANT
CONT SPEC 4OZ CLIKSEAL STRL BL (MISCELLANEOUS) ×3 IMPLANT
COVER BACK TABLE 24X17X13 BIG (DRAPES) IMPLANT
COVER TABLE BACK 60X90 (DRAPES) ×3 IMPLANT
DRAPE C-ARM 42X72 X-RAY (DRAPES) ×6 IMPLANT
DRAPE LAPAROTOMY 100X72X124 (DRAPES) ×3 IMPLANT
DRAPE POUCH INSTRU U-SHP 10X18 (DRAPES) ×3 IMPLANT
DRAPE PROXIMA HALF (DRAPES) ×2 IMPLANT
DRSG OPSITE POSTOP 4X8 (GAUZE/BANDAGES/DRESSINGS) ×2 IMPLANT
DURAPREP 26ML APPLICATOR (WOUND CARE) ×3 IMPLANT
ELECT REM PT RETURN 9FT ADLT (ELECTROSURGICAL) ×3
ELECTRODE REM PT RTRN 9FT ADLT (ELECTROSURGICAL) ×1 IMPLANT
EVACUATOR 1/8 PVC DRAIN (DRAIN) ×2 IMPLANT
EVACUATOR 3/16  PVC DRAIN (DRAIN) ×2
EVACUATOR 3/16 PVC DRAIN (DRAIN) IMPLANT
GAUZE SPONGE 4X4 16PLY XRAY LF (GAUZE/BANDAGES/DRESSINGS) ×5 IMPLANT
GLOVE BIO SURGEON STRL SZ 6.5 (GLOVE) ×3 IMPLANT
GLOVE BIO SURGEON STRL SZ7.5 (GLOVE) ×8 IMPLANT
GLOVE BIO SURGEONS STRL SZ 6.5 (GLOVE) ×3
GLOVE BIOGEL M 8.0 STRL (GLOVE) ×3 IMPLANT
GLOVE BIOGEL PI IND STRL 7.0 (GLOVE) IMPLANT
GLOVE BIOGEL PI INDICATOR 7.0 (GLOVE) ×2
GLOVE ECLIPSE 6.5 STRL STRAW (GLOVE) ×2 IMPLANT
GLOVE ECLIPSE 8.5 STRL (GLOVE) ×2 IMPLANT
GLOVE EXAM NITRILE LRG STRL (GLOVE) ×4 IMPLANT
GLOVE EXAM NITRILE MD LF STRL (GLOVE) IMPLANT
GLOVE EXAM NITRILE XL STR (GLOVE) IMPLANT
GLOVE EXAM NITRILE XS STR PU (GLOVE) IMPLANT
GLOVE INDICATOR 7.5 STRL GRN (GLOVE) ×4 IMPLANT
GOWN BRE IMP SLV AUR LG STRL (GOWN DISPOSABLE) ×3 IMPLANT
GOWN BRE IMP SLV AUR XL STRL (GOWN DISPOSABLE) ×3 IMPLANT
GOWN STRL REIN 2XL LVL4 (GOWN DISPOSABLE) IMPLANT
GOWN STRL REUS W/ TWL LRG LVL3 (GOWN DISPOSABLE) IMPLANT
GOWN STRL REUS W/ TWL XL LVL3 (GOWN DISPOSABLE) IMPLANT
GOWN STRL REUS W/TWL LRG LVL3 (GOWN DISPOSABLE) ×12
GOWN STRL REUS W/TWL XL LVL3 (GOWN DISPOSABLE) ×3
KIT BASIN OR (CUSTOM PROCEDURE TRAY) ×3 IMPLANT
KIT INFUSE LRG II (Orthopedic Implant) ×2 IMPLANT
KIT ROOM TURNOVER OR (KITS) ×3 IMPLANT
MILL MEDIUM DISP (BLADE) ×2 IMPLANT
NDL HYPO 18GX1.5 BLUNT FILL (NEEDLE) IMPLANT
NDL HYPO 21X1.5 SAFETY (NEEDLE) IMPLANT
NDL HYPO 25X1 1.5 SAFETY (NEEDLE) IMPLANT
NEEDLE HYPO 18GX1.5 BLUNT FILL (NEEDLE) IMPLANT
NEEDLE HYPO 21X1.5 SAFETY (NEEDLE) ×3 IMPLANT
NEEDLE HYPO 25X1 1.5 SAFETY (NEEDLE) IMPLANT
NS IRRIG 1000ML POUR BTL (IV SOLUTION) ×3 IMPLANT
PACK FOAM VITOSS 10CC (Orthopedic Implant) ×4 IMPLANT
PACK LAMINECTOMY NEURO (CUSTOM PROCEDURE TRAY) ×3 IMPLANT
PAD ABD 8X10 STRL (GAUZE/BANDAGES/DRESSINGS) IMPLANT
PAD ARMBOARD 7.5X6 YLW CONV (MISCELLANEOUS) ×9 IMPLANT
PATTIES SURGICAL .5 X1 (DISPOSABLE) ×7 IMPLANT
PATTIES SURGICAL .5 X3 (DISPOSABLE) IMPLANT
PATTIES SURGICAL 1X1 (DISPOSABLE) ×2 IMPLANT
ROD CURVED 100MM (Rod) ×4 IMPLANT
SCREW REVERE 5.5X45 (Screw) ×16 IMPLANT
SPACER SUSTAIN O SM 8X22 10M (Spacer) ×8 IMPLANT
SPONGE GAUZE 4X4 12PLY (GAUZE/BANDAGES/DRESSINGS) ×3 IMPLANT
SPONGE LAP 4X18 X RAY DECT (DISPOSABLE) IMPLANT
SPONGE NEURO XRAY DETECT 1X3 (DISPOSABLE) IMPLANT
SPONGE SURGIFOAM ABS GEL 100 (HEMOSTASIS) ×3 IMPLANT
STRIP CLOSURE SKIN 1/2X4 (GAUZE/BANDAGES/DRESSINGS) ×3 IMPLANT
SUT VIC AB 1 CT1 18XBRD ANBCTR (SUTURE) ×2 IMPLANT
SUT VIC AB 1 CT1 8-18 (SUTURE) ×6
SUT VIC AB 2-0 CP2 18 (SUTURE) ×6 IMPLANT
SUT VIC AB 3-0 SH 8-18 (SUTURE) ×6 IMPLANT
SYR 20CC LL (SYRINGE) ×2 IMPLANT
SYR 20ML ECCENTRIC (SYRINGE) ×3 IMPLANT
SYR 5ML LL (SYRINGE) IMPLANT
TOWEL OR 17X24 6PK STRL BLUE (TOWEL DISPOSABLE) ×3 IMPLANT
TOWEL OR 17X26 10 PK STRL BLUE (TOWEL DISPOSABLE) ×3 IMPLANT
TRAY FOLEY CATH 14FRSI W/METER (CATHETERS) ×3 IMPLANT
WATER STERILE IRR 1000ML POUR (IV SOLUTION) ×3 IMPLANT

## 2014-01-06 NOTE — Progress Notes (Signed)
Op note [phone]

## 2014-01-06 NOTE — Progress Notes (Signed)
Pt arrived on the unit via stretcher at 1715. Pt A&O x4, Foley intact, incision dsg dry and intact with scant stain; hemavac in place and suctioning; pt oriented to the unit and room; call light within reach; IV remains intact and infusing. Awaiting on pt PCA pump and will start. Pt sleeping comfortably in bed with call light within reach. Will continue to monitor. Francis Gaines Oval Cavazos RN.

## 2014-01-06 NOTE — Progress Notes (Signed)
Pt PCA started and dual set up with second RN Vicente Males for verification. Pt vomited x1 after the PCA was started and she got her initial ordered bolus of the medication; ordered zofran adm and pt voices relief. Emesis was green in color with food particles. Pt sleeping quietly in bed. Prior to starting pt PCA pt family at bedside expressed pt "looks swollen" and stated pt used to be on "fluid pill" and wanted to know if it can be restarted or what's causing pt to look swollen. Pt has +1 generalized edema; non pitting mostly in her BUE fingers during assessment. Family informed note will be left for MD for notification and reported off to incoming RN. Incoming RN notified and reported off to continue to monitor pt quietly. Francis Gaines Khylie Larmore RN.

## 2014-01-06 NOTE — Anesthesia Preprocedure Evaluation (Addendum)
Anesthesia Evaluation  Patient identified by MRN, date of birth, ID band Patient awake    Reviewed: Allergy & Precautions, H&P , NPO status , Patient's Chart, lab work & pertinent test results  Airway Mallampati: II TM Distance: >3 FB Neck ROM: Full    Dental  (+) Teeth Intact, Dental Advisory Given   Pulmonary Current Smoker,  breath sounds clear to auscultation        Cardiovascular + Peripheral Vascular Disease Rhythm:Regular Rate:Normal     Neuro/Psych    GI/Hepatic negative GI ROS, Neg liver ROS,   Endo/Other  negative endocrine ROS  Renal/GU negative Renal ROS     Musculoskeletal   Abdominal   Peds  Hematology   Anesthesia Other Findings   Reproductive/Obstetrics                         Anesthesia Physical Anesthesia Plan  ASA: II  Anesthesia Plan: General   Post-op Pain Management:    Induction: Intravenous  Airway Management Planned: Oral ETT  Additional Equipment:   Intra-op Plan:   Post-operative Plan: Extubation in OR  Informed Consent: I have reviewed the patients History and Physical, chart, labs and discussed the procedure including the risks, benefits and alternatives for the proposed anesthesia with the patient or authorized representative who has indicated his/her understanding and acceptance.   Dental advisory given  Plan Discussed with: CRNA and Anesthesiologist  Anesthesia Plan Comments:         Anesthesia Quick Evaluation

## 2014-01-06 NOTE — Progress Notes (Addendum)
Patient stated that she does not want the PCA due to the constant alarming. Explain risks of severe pain without, patient stated that she would rather control pain through PO meds than to have PCA. Stopped PCA. Will continue to monitor.2201-Sent text page to [facility] concerning stopping PCA.   Notified Pool, (Neurosurgery)

## 2014-01-06 NOTE — Anesthesia Postprocedure Evaluation (Signed)
  Anesthesia Post-op Note  Patient: [name]  Procedure(s) Performed: Procedure(s) with comments: LUMBAR TWO TO THREE, LUMBAR THREE TO FOUR, LUMBAR FOUR TO FIVE POSTERIOR LUMBAR FUSION 3 LEVEL (N/A) - L2-3 L3-4 L4-5 Lumbar Fusion  Patient Location: PACU  Anesthesia Type:General  Level of Consciousness: awake  Airway and Oxygen Therapy: Patient Spontanous Breathing  Post-op Pain: mild  Post-op Assessment: Post-op Vital signs reviewed  Post-op Vital Signs: Reviewed  Last Vitals:  Filed Vitals:   01/06/14 1557  BP: 114/62  Pulse:   Temp: 36.4 C  Resp: 25    Complications: No apparent anesthesia complications

## 2014-01-06 NOTE — Progress Notes (Signed)
Note left for MD.  Report given off to incoming RN. Francis Gaines Mandrell Vangilder RN.

## 2014-01-06 NOTE — Anesthesia Procedure Notes (Signed)
Procedure Name: Intubation Date/Time: 01/06/2014 10:57 AM Performed by: Marinda Elk A Pre-anesthesia Checklist: Patient identified, Timeout performed, Emergency Drugs available, Suction available and Patient being monitored Patient Re-evaluated:Patient Re-evaluated prior to inductionOxygen Delivery Method: Circle system utilized Preoxygenation: Pre-oxygenation with 100% oxygen Intubation Type: IV induction Ventilation: Mask ventilation without difficulty Laryngoscope Size: Mac and 3 Grade View: Grade III Tube type: Oral Tube size: 7.5 mm Number of attempts: 1 Airway Equipment and Method: Stylet Placement Confirmation: ETT inserted through vocal cords under direct vision,  breath sounds checked- equal and bilateral and positive ETCO2 Secured at: 21 cm Tube secured with: Tape Dental Injury: Teeth and Oropharynx as per pre-operative assessment

## 2014-01-06 NOTE — Transfer of Care (Signed)
Immediate Anesthesia Transfer of Care Note  Patient: [name]  Procedure(s) Performed: Procedure(s) with comments: LUMBAR TWO TO THREE, LUMBAR THREE TO FOUR, LUMBAR FOUR TO FIVE POSTERIOR LUMBAR FUSION 3 LEVEL (N/A) - L2-3 L3-4 L4-5 Lumbar Fusion  Patient Location: PACU  Anesthesia Type:General  Level of Consciousness: awake  Airway & Oxygen Therapy: Patient Spontanous Breathing and Patient connected to nasal cannula oxygen  Post-op Assessment: Report given to PACU RN, Post -op Vital signs reviewed and stable and Patient moving all extremities  Post vital signs: Reviewed and stable  Complications: No apparent anesthesia complications

## 2014-01-07 LAB — CBC WITH DIFFERENTIAL/PLATELET
Basophils Absolute: 0 10*3/uL (ref 0.0–0.1)
Basophils Relative: 0 % (ref 0–1)
EOS ABS: 0 10*3/uL (ref 0.0–0.7)
EOS PCT: 0 % (ref 0–5)
HCT: 19.9 % — ABNORMAL LOW (ref 36.0–46.0)
Hemoglobin: 6.9 g/dL — CL (ref 12.0–15.0)
LYMPHS ABS: 1.6 10*3/uL (ref 0.7–4.0)
Lymphocytes Relative: 16 % (ref 12–46)
MCH: 34.2 pg — AB (ref 26.0–34.0)
MCHC: 34.7 g/dL (ref 30.0–36.0)
MCV: 98.5 fL (ref 78.0–100.0)
MONO ABS: 0.9 10*3/uL (ref 0.1–1.0)
Monocytes Relative: 9 % (ref 3–12)
Neutro Abs: 7.5 10*3/uL (ref 1.7–7.7)
Neutrophils Relative %: 75 % (ref 43–77)
PLATELETS: 119 10*3/uL — AB (ref 150–400)
RBC: 2.02 MIL/uL — AB (ref 3.87–5.11)
RDW: 14.8 % (ref 11.5–15.5)
WBC: 10.1 10*3/uL (ref 4.0–10.5)

## 2014-01-07 LAB — PREPARE RBC (CROSSMATCH)

## 2014-01-07 NOTE — Progress Notes (Signed)
Postop day 1. Patient with minimal pain. No leg pain. Feels better overall.  Afebrile. Vital signs stable. Urine output good. Drain output moderately high. Hemoglobin 6.9 this morning. Transfusion ordered. Awake and alert. Oriented and appropriate. Motor and sensory function intact. Chest and abdomen benign.  Overall doing well following multilevel decompression and fusion. Patient receiving transfusion secondary to acute blood loss anemia. Plan to mobilize was transfusion complete. Possible discharge tomorrow.

## 2014-01-07 NOTE — Evaluation (Signed)
Physical Therapy Evaluation Patient Details Name: [name] MRN: [id number] DOB: [birth year] Today's Date: 01/07/2014   History of Present Illness  Patient is a 68 yo female s/p L2-5 fusion.  Patient with h/o back surgery x3 and Bil. THA.  Clinical Impression  Patient presents with problems listed below.  Will benefit from acute PT to maximize independence prior to discharge home with 24 hour assist.    Follow Up Recommendations No PT follow up;Supervision/Assistance - 24 hour    Equipment Recommendations  None recommended by PT    Recommendations for Other Services       Precautions / Restrictions Precautions Precautions: Back Precaution Booklet Issued: Yes (comment) Precaution Comments: Reviewed back precautions Required Braces or Orthoses: Spinal Brace Spinal Brace: Lumbar corset Restrictions Weight Bearing Restrictions: No      Mobility  Bed Mobility Overal bed mobility: Modified Independent             General bed mobility comments: Patient moving sit > sidelying with use of rail with no physical assist, maintaining back precautions.  Transfers Overall transfer level: Modified independent Equipment used: None             General transfer comment: Increased time for transfers.  No physical assist needed.  Good balance in standing.  Ambulation/Gait Ambulation/Gait assistance: Supervision Ambulation Distance (Feet): 140 Feet Assistive device: None (Pushing IV pole) Gait Pattern/deviations: Step-through pattern;Decreased stride length Gait velocity: Decreased Gait velocity interpretation: Below normal speed for age/gender General Gait Details: Patient ambulates with good gait pattern and balance.  Stairs            Wheelchair Mobility    Modified Rankin (Stroke Patients Only)       Balance                                             Pertinent Vitals/Pain     Home Living Family/patient expects to be discharged  to:: Private residence Living Arrangements: Alone Available Help at Discharge: Friend(s);Neighbor;Available 24 hours/day Type of Home: Mobile home Home Access: Stairs to enter Entrance Stairs-Rails: Right;Left Entrance Stairs-Number of Steps: 6 Home Layout: One level Home Equipment: Walker - 2 wheels;Bedside commode      Prior Function Level of Independence: Independent         Comments: Per sister, patient very independent and active     Hand Dominance        Extremity/Trunk Assessment   Upper Extremity Assessment: Overall WFL for tasks assessed           Lower Extremity Assessment: Overall WFL for tasks assessed         Communication   Communication: No difficulties  Cognition Arousal/Alertness: Awake/alert Behavior During Therapy: WFL for tasks assessed/performed Overall Cognitive Status: Within Functional Limits for tasks assessed                      General Comments General comments (skin integrity, edema, etc.): Patient receiving blood (end of transfusion).  Patient with no symptoms with mobility.    Exercises        Assessment/Plan    PT Assessment Patient needs continued PT services  PT Diagnosis Abnormality of gait;Acute pain   PT Problem List Decreased activity tolerance;Decreased mobility;Decreased knowledge of precautions;Pain  PT Treatment Interventions Gait training;Stair training;Functional mobility training;Patient/family education   PT Goals (Current goals can be  found in the Care Plan section) Acute Rehab PT Goals Patient Stated Goal: To go home tomorrow PT Goal Formulation: With patient Time For Goal Achievement: 01/14/14 Potential to Achieve Goals: Good    Frequency Min 5X/week   Barriers to discharge Decreased caregiver support Lives alone.  Neighbor coming to stay with patient at her home.  Friends available as well.      Co-evaluation               End of Session Equipment Utilized During Treatment: Gait  belt Activity Tolerance: Patient tolerated treatment well;Patient limited by pain Patient left: in bed;with call bell/phone within reach;with family/visitor present Nurse Communication: Mobility status         Time: 9604-5409 PT Time Calculation (min): 21 min   Charges:   PT Evaluation $Initial PT Evaluation Tier I: 1 Procedure PT Treatments $Gait Training: 8-22 mins   PT G Codes:          Despina Pole Feb 02, 2014, 4:48 PM Carita Pian. Sanjuana Kava, [place] Pager [phone]

## 2014-01-07 NOTE — Progress Notes (Signed)
OT Cancellation Note  Patient Details Name: ALISSA PHARR MRN: [id number] DOB: May 05, 1946   Cancelled Treatment:    Reason Eval/Treat Not Completed: OT screened, no needs identified, will sign off.  Spoke with pt and pt with no OT needs-has had previous back surgery.   Benito Mccreedy  OTR/L [phone]  01/07/2014, 4:43 PM

## 2014-01-07 NOTE — Progress Notes (Signed)
Patient's incisional dressing became rolled up on the edge and serosanguineous drainage noted on bed. Reinforced dressing with Telfa and paper tape.Changed linens and will continue to monitor.

## 2014-01-07 NOTE — Progress Notes (Addendum)
CRITICAL VALUE ALERT  Critical value received: Hemoglobin 6.9.  Date of notification: 01/07/2014  Time of notification:  373  Critical value read back  Nurse who received alert:  Garen Lah RN  MD notified (1st page):  Neurosurgery paged. Spoke with Trenton Gammon MD  Time of first page:  728, returned call at 27 MD notified (2nd page):  Time of second page:  Responding MD:   Time MD responded:    Orders to be received. Will notify next nurse. Will continue to monitor.

## 2014-01-07 NOTE — Op Note (Signed)
NAMEKELSA, JAWOROWSKI               ACCOUNT NO.:  [id number]  MEDICAL RECORD NO.:  [id number]  LOCATION:  [id number]                        FACILITY:  [facility]  PHYSICIAN:  Leeroy Cha, M.D.   DATE OF BIRTH:  [birth year]  DATE OF PROCEDURE:  01/06/2014 DATE OF DISCHARGE:                              OPERATIVE REPORT   PREOPERATIVE DIAGNOSES:  Degenerative disk disease, lumbar 2-3, 3-4, and 4-5.  Bilateral chronic radiculopathy.  Status post lumbar laminotomies and diskectomy.  POSTOPERATIVE DIAGNOSES:  Degenerative disk disease, lumbar 2-3, 3-4, and 4-5.  Bilateral chronic radiculopathy.  Status post lumbar laminotomies and diskectomy.  PROCEDURE:  Bilateral L2, L3, and L4 laminectomy with facetectomy, bilateral L4-5 diskectomy, right 2-3 and 3-4 diskectomy, insertion of cages at the level of 2-3, 1, at the level of 3-4 1 cage and at the level of 4-5 2 cages.  Pedicle screws from L2-L5, posterolateral arthrodesis with BMP Vitoss as well as autograft.  Screws from L2, L3, L4, and L5.  Cell Saver.  C-arm.  SURGEON:  Leeroy Cha, M.D.  ASSISTANT:  Cooper Render. Pool, M.D.  CLINICAL HISTORY:  Mrs. Glodowski is a lady who in the past underwent lumbar diskectomy.  Lately, she had been complaining of bilateral leg pain associated with weakness, numbness sensation.  The patient had difficulty walking with claudication.  Sliding is almost impossible. She had failed conservative treatment.  X-ray shows severe degenerative disk disease at the level of L2-L3, 3-4, and 5-5.  Surgery was advised. She knew the risk of the surgery including infection, no improvement whatsoever, hematoma.  DESCRIPTION OF PROCEDURE:  The patient was taken to the OR, and after intubation, she was positioned in a prone manner.  The back was cleaned first with Betadine and later on with DuraPrep.  Drapes were applied. Midline incision from L1-L2 down to L4-5 was made and muscle will retract laterally.  At the level of 4-5,  the patient had quite a bit of scar tissue and lysis was accomplished.  With the help of C-arm, we identified the spinous process of 2, 3 and 4, and all three were removed.  Also, we removed the lamina and the facet of L2, L3 and L4. At the level of 4-5, the patient had quite a bit of adhesion.  Lysis was accomplished, and we were able to free the thecal sac as well as the L4 and L5 nerve root.  The same procedure was done at the level of 2-3 and 3-4.  We started at the level of 4-5 with diskectomy first in the right side and later on in the left side.  The area was quite narrow, but we were able to introduce at the end, two cages of 10 x 22 with autograft and BMP.  The rest of the space was filled with autograft.  The diskectomy was begun normal to able to introduce thick cages.  At the level of 2-3 and 3-4, it was difficult to come from the left side.  The patient had quite a bit of adhesion and it was quite difficult to get into the disk space.  We entered the disk space from the right side where diskectomy was accomplished laterally and  medially.  Two cages of 10 x 22 one at the level of 2-3 and the other one at 3-4 were inserted. The rest of the disk space in the right side was filled up with autograft and Vitoss.  From then on, using the C-arm first in AP view and then a lateral view, we were able to made some holes in the pedicles, we made total of eight.  Prior to insertion of the screws, we feel the area just to be sure that we were surrounded by bone.  Eight screws of 5.5 x 45 were inserted and kept in place with rod and Capps. Then, a cross-link from right to left was used.  From then on, we went laterally and we removed the periosteum of the lateral aspect of the facet of 2-3, 3-4, and 4-5, and a mix of BMP, autograft and Vitoss was used for arthrodesis.  During the whole procedure, the patient has quite a bit of venous bleeding and the Cell Saver was used to minimize the  blood loss.  Valsalva maneuver was negative.  The area was irrigated.  A drain was left in the pleural space and the wound wound was closed with several layers of Vicryl and Steri-Strip.          ______________________________ Leeroy Cha, M.D.     EB/MEDQ  D:  01/06/2014  T:  01/07/2014  Job:  [id number]

## 2014-01-07 NOTE — Progress Notes (Signed)
Wasted 15 mg/ml IV of Morphine PCA and disconnected tubing. Wasted with Merrilee Jansky RN at pyxis.

## 2014-01-07 NOTE — Progress Notes (Signed)
OT Cancellation Note  Patient Details Name: [name] MRN: [id number] DOB: [birth year]   Cancelled Treatment:    Reason Eval/Treat Not Completed: Other (comment) (Pt to receive blood transfusion later today. Will check back later. Hemoglobin 6.9)  Benito Mccreedy OTR/L [phone] 01/07/2014, 12:01 PM

## 2014-01-08 LAB — TYPE AND SCREEN
ABO/RH(D): O POS
Antibody Screen: NEGATIVE
UNIT DIVISION: 0
Unit division: 0

## 2014-01-08 MED ORDER — DIAZEPAM 5 MG PO TABS
5.0000 mg | ORAL_TABLET | Freq: Four times a day (QID) | ORAL | Status: DC | PRN
Start: 2014-01-08 — End: 2016-07-15

## 2014-01-08 MED ORDER — HYDROCODONE-ACETAMINOPHEN 10-325 MG PO TABS: 1.0000 | ORAL_TABLET | Freq: Four times a day (QID) | ORAL | Status: DC | PRN

## 2014-01-08 NOTE — Progress Notes (Signed)
Physical Therapy Treatment Patient Details Name: [name] MRN: [id number] DOB: 06/16/1946 Today's Date: January 17, 2014    History of Present Illness Patient is a 68 yo female s/p L2-5 fusion.  Patient with h/o back surgery x3 and Bil. THA.    PT Comments    Patient is independent with ambulation and mobility.  Able to complete stairs at mod I level.  Goals achieved.  Patient ready for discharge from PT perspective.  Follow Up Recommendations  No PT follow up;Supervision/Assistance - 24 hour     Equipment Recommendations  None recommended by PT    Recommendations for Other Services       Precautions / Restrictions Precautions Precautions: Back Precaution Comments: Patient recalls 3/3 back precautions Required Braces or Orthoses: Spinal Brace Spinal Brace: Lumbar corset Restrictions Weight Bearing Restrictions: No    Mobility  Bed Mobility Overal bed mobility: Independent                Transfers Overall transfer level: Independent Equipment used: None             General transfer comment: Good balance in standing  Ambulation/Gait Ambulation/Gait assistance: Independent Ambulation Distance (Feet): 180 Feet Assistive device: None Gait Pattern/deviations: Step-through pattern;Decreased stride length Gait velocity: Decreased Gait velocity interpretation: Below normal speed for age/gender General Gait Details: Patient ambulates with good gait pattern and balance.   Stairs Stairs: Yes Stairs assistance: Modified independent (Device/Increase time) Stair Management: Two rails;Step to pattern;Forwards Number of Stairs: 5 General stair comments: Patient able to negotiate stairs using rails at mod I level  Wheelchair Mobility    Modified Rankin (Stroke Patients Only)       Balance                                    Cognition Arousal/Alertness: Awake/alert Behavior During Therapy: WFL for tasks assessed/performed Overall  Cognitive Status: Within Functional Limits for tasks assessed                      Exercises      General Comments        Pertinent Vitals/Pain     Home Living                      Prior Function            PT Goals (current goals can now be found in the care plan section) Progress towards PT goals: Goals met/education completed, patient discharged from PT    Frequency  Min 5X/week    PT Plan Current plan remains appropriate    Co-evaluation             End of Session Equipment Utilized During Treatment: Gait belt Activity Tolerance: Patient tolerated treatment well;Patient limited by pain Patient left: in bed;with call bell/phone within reach;with family/visitor present (sitting EOB)     Time: 0910-0920 PT Time Calculation (min): 10 min  Charges:  $Gait Training: 8-22 mins                    G Codes:      Despina Pole 17-Jan-2014, 9:26 AM Carita Pian. Sanjuana Kava, [place] Pager [phone]

## 2014-01-08 NOTE — Progress Notes (Signed)
Pt d/c to home by car with friend. Assessment stable. Prescriptions given.

## 2014-01-08 NOTE — Discharge Summary (Signed)
Physician Discharge Summary  Patient ID: [name] MRN: [id number] DOB/AGE: 1946/06/26 68 y.o.  Admit date: 01/06/2014 Discharge date: 01/08/2014  Admission Diagnoses:  Discharge Diagnoses:  Active Problems:   Lumbar degenerative disc disease   Discharged Condition: good  Hospital Course: Patient admitted the hospital where she underwent an uncomplicated three-level lumbar decompression and fusion. Postoperative she is doing very well. Preoperative back and leg pain have resolved. Patient did require postoperative transfusion secondary to acute blood loss anemia. Otherwise no other difficulties during hospitalization. Patient ready for discharge home.  Consults:   Significant Diagnostic Studies:   Treatments:   Discharge Exam: Blood pressure 115/69, pulse 103, temperature 98.9 F (37.2 C), temperature source Oral, resp. rate 18, height 5' 6.5" (1.689 m), weight 58.968 kg (130 lb), SpO2 97.00%. Awake and alert. Oriented and appropriate. Motor and sensory function intact. Wound clean and dry. Chest and abdomen benign.  Disposition: 01-Home or Self Care   Future Appointments Provider Department Dept Phone   12/05/2014 9:00 AM [facility] [address] [phone]   Eat a light meal the night before the exam Nothing to eat or drink for at least 8 hours before exam No gum chewing, or smoking the morning of the exam. Please take your morning medications with small sips of water, especially blood pressure medication *Very Important* Please wear 2 piece clothing   12/05/2014 9:30 AM Mal Misty, MD [facility] [phone]       Medication List         cetirizine 10 MG tablet  Commonly known as:  ZYRTEC  Take 10 mg by mouth daily.     diazepam 5 MG tablet  Commonly known as:  VALIUM  Take 1 tablet (5 mg total) by mouth every 6 (six) hours as needed for muscle spasms.     HYDROcodone-acetaminophen 10-325 MG per  tablet  Commonly known as:  NORCO  Take 1 tablet by mouth every 6 (six) hours as needed for moderate pain.           Follow-up Information   Follow up with Floyce Stakes, MD.   Specialty:  Neurosurgery   Contact information:   [address] [place] [address] [phone]       Signed: Charlie Pitter 01/08/2014, 9:13 AM

## 2014-01-09 MED FILL — Heparin Sodium (Porcine) Inj 1000 Unit/ML: INTRAMUSCULAR | Qty: 30 | Status: AC

## 2014-01-09 MED FILL — Sodium Chloride IV Soln 0.9%: INTRAVENOUS | Qty: 1000 | Status: AC

## 2014-01-09 MED FILL — Sodium Chloride Irrigation Soln 0.9%: Qty: 1000 | Status: AC

## 2014-01-09 NOTE — Progress Notes (Signed)
Utilization review completed.  

## 2014-01-19 ENCOUNTER — Encounter: Payer: Self-pay | Admitting: Nurse Practitioner

## 2014-04-27 ENCOUNTER — Telehealth: Payer: Self-pay | Admitting: Nurse Practitioner

## 2014-04-27 NOTE — Telephone Encounter (Signed)
Please advise 

## 2014-04-27 NOTE — Telephone Encounter (Signed)
Has she called her surgeon? He should advise her on precautions & follow ups.

## 2014-04-27 NOTE — Telephone Encounter (Signed)
Patient states her hip replacement has been recalled. She states she has a "gaping hole". She wants to know if we can test her chromium & cobalt levels.

## 2014-04-28 NOTE — Telephone Encounter (Signed)
Patient stated that she has an appt today to see neurosurgeon. Patient will let us know what he says about patient's hip.

## 2014-05-01 NOTE — Telephone Encounter (Signed)
Pt called and left a voicemail. I called pt and left message for her to return our call.

## 2014-05-01 NOTE — Telephone Encounter (Signed)
Spoke with pt, advised message from Layne. Pt understood. 

## 2014-05-01 NOTE — Telephone Encounter (Signed)
She must get referral from Dr Joya Salm. I don't know anything about this problem. She has not seen me for this.

## 2014-05-01 NOTE — Telephone Encounter (Signed)
Pt called and states Dr Joya Salm looked at her hip and 4 total views of her back. He told pt, this is not your back or nerve damage. He states she has no muscle in her hip and would need to go to [facility] to get this surgically repaired.Eilene Ghazi (orthopedic surgeon) is who she needs a referral with. FYI Layne then to Diane. Thanks.

## 2014-05-10 ENCOUNTER — Ambulatory Visit (INDEPENDENT_AMBULATORY_CARE_PROVIDER_SITE_OTHER): Payer: Medicare Other | Admitting: Nurse Practitioner

## 2014-05-10 ENCOUNTER — Encounter: Payer: Self-pay | Admitting: Nurse Practitioner

## 2014-05-10 VITALS — BP 110/70 | HR 88 | Temp 97.8°F | Ht 65.17 in | Wt 131.2 lb

## 2014-05-10 DIAGNOSIS — Z0000 Encounter for general adult medical examination without abnormal findings: Secondary | ICD-10-CM | POA: Insufficient documentation

## 2014-05-10 DIAGNOSIS — H60399 Other infective otitis externa, unspecified ear: Secondary | ICD-10-CM

## 2014-05-10 DIAGNOSIS — E559 Vitamin D deficiency, unspecified: Secondary | ICD-10-CM | POA: Insufficient documentation

## 2014-05-10 DIAGNOSIS — M25551 Pain in right hip: Secondary | ICD-10-CM | POA: Insufficient documentation

## 2014-05-10 DIAGNOSIS — H6093 Unspecified otitis externa, bilateral: Secondary | ICD-10-CM | POA: Insufficient documentation

## 2014-05-10 DIAGNOSIS — H6120 Impacted cerumen, unspecified ear: Secondary | ICD-10-CM | POA: Insufficient documentation

## 2014-05-10 DIAGNOSIS — Z136 Encounter for screening for cardiovascular disorders: Secondary | ICD-10-CM

## 2014-05-10 DIAGNOSIS — Z23 Encounter for immunization: Secondary | ICD-10-CM

## 2014-05-10 DIAGNOSIS — M25559 Pain in unspecified hip: Secondary | ICD-10-CM

## 2014-05-10 DIAGNOSIS — H6123 Impacted cerumen, bilateral: Secondary | ICD-10-CM

## 2014-05-10 LAB — CBC
HCT: 45.9 % (ref 36.0–46.0)
HEMOGLOBIN: 15.5 g/dL — AB (ref 12.0–15.0)
MCHC: 33.7 g/dL (ref 30.0–36.0)
MCV: 93.7 fl (ref 78.0–100.0)
Platelets: 226 10*3/uL (ref 150.0–400.0)
RBC: 4.9 Mil/uL (ref 3.87–5.11)
RDW: 17.4 % — AB (ref 11.5–15.5)
WBC: 8.7 10*3/uL (ref 4.0–10.5)

## 2014-05-10 LAB — LIPID PANEL
CHOL/HDL RATIO: 4
Cholesterol: 209 mg/dL — ABNORMAL HIGH (ref 0–200)
HDL: 51.7 mg/dL (ref >39.00)
LDL Cholesterol: 130 mg/dL — ABNORMAL HIGH (ref 0–99)
NonHDL: 157.3
Triglycerides: 137 mg/dL (ref 0.0–149.0)
VLDL: 27.4 mg/dL (ref 0.0–40.0)

## 2014-05-10 LAB — URINALYSIS, ROUTINE W REFLEX MICROSCOPIC
BILIRUBIN URINE: NEGATIVE
Hgb urine dipstick: NEGATIVE
Ketones, ur: NEGATIVE
NITRITE: NEGATIVE
Specific Gravity, Urine: <=1.005 — AB (ref 1.000–1.030)
Total Protein, Urine: NEGATIVE
UROBILINOGEN UA: 0.2 (ref 0.0–1.0)
Urine Glucose: NEGATIVE
pH: 6 (ref 5.0–8.0)

## 2014-05-10 LAB — COMPREHENSIVE METABOLIC PANEL
ALBUMIN: 4.1 g/dL (ref 3.5–5.2)
ALT: 12 U/L (ref 0–35)
AST: 24 U/L (ref 0–37)
Alkaline Phosphatase: 74 U/L (ref 39–117)
BUN: 7 mg/dL (ref 6–23)
CHLORIDE: 101 meq/L (ref 96–112)
CO2: 29 meq/L (ref 19–32)
Calcium: 9.5 mg/dL (ref 8.4–10.5)
Creatinine, Ser: 0.8 mg/dL (ref 0.4–1.2)
GFR: 81.24 mL/min (ref >60.00)
Glucose, Bld: 78 mg/dL (ref 70–99)
POTASSIUM: 4.1 meq/L (ref 3.5–5.1)
Sodium: 137 mEq/L (ref 135–145)
TOTAL PROTEIN: 6.8 g/dL (ref 6.0–8.3)
Total Bilirubin: 0.6 mg/dL (ref 0.2–1.2)

## 2014-05-10 LAB — TSH: TSH: 0.8 u[IU]/mL (ref 0.35–4.50)

## 2014-05-10 LAB — VITAMIN D 25 HYDROXY (VIT D DEFICIENCY, FRACTURES): VITD: 25.82 ng/mL — ABNORMAL LOW (ref 30.00–100.00)

## 2014-05-10 LAB — VITAMIN B12: VITAMIN B 12: 239 pg/mL (ref 211–911)

## 2014-05-10 LAB — T4, FREE: FREE T4: 0.83 ng/dL (ref 0.60–1.60)

## 2014-05-10 MED ORDER — HYDROCODONE-ACETAMINOPHEN 10-325 MG PO TABS: 1.0000 | ORAL_TABLET | Freq: Two times a day (BID) | ORAL | Status: DC | PRN

## 2014-05-10 MED ORDER — NEOMYCIN-POLYMYXIN-HC 3.5-10000-1 OT SOLN: 4.0000 [drp] | Freq: Four times a day (QID) | OTIC | Status: DC

## 2014-05-10 NOTE — Assessment & Plan Note (Signed)
No need for pap-complete hysterectomy & age MMG due this year-pt will call to schedule Needs pneumococcal Offered LDCT chest-smoker

## 2014-05-10 NOTE — Assessment & Plan Note (Signed)
Posterior Atrophy of R hip: likely gluteus max or/and medius Constant burning pain Hip replcmt 2011, recall on hardware Narcotic pain med & valium help w/pain. Appt pending w/ortho Dr Jefferson Fuel, in [place].

## 2014-05-10 NOTE — Progress Notes (Signed)
Pre visit review using our clinic review tool, if applicable. No additional management support is needed unless otherwise documented below in the visit note. 

## 2014-05-10 NOTE — Assessment & Plan Note (Signed)
bilat ears flushed w/warm water. Unsuccessful. Plug removed from R ear w/tweezers. Waxy debris removed from L ear w/tweexers & currette. Canals red. Vinegar in ears twice daily for 2 days, if become swollen or painful start ABX drops.

## 2014-05-10 NOTE — Progress Notes (Signed)
Subjective:     [name] is a 68 y.o. female presents for "fasting labs". She was last seen 15 mos ago for physical. She was found to be vit D deficient, but unable to tolerate supplements due to diarrhea. She is still smoking. Since she was in office, she has had 4th back surgery. She was found to have AAA 2.5 cm during pre-op w/u. Dr Kellie Simmering is monitoring this. She c/o ear wax in L ear. She c/o constant burning hip pain, R for 6 mos. She notices atrophy of muscles over posterior hip. Dr Joya Salm has prescribed norco & valium, which helps. Her hip was replaced in 2011. The hardware has been recalled. She plans to see ortho in [place] for second opinion.  Her sister is living with her.  The following portions of the patient's history were reviewed and updated as appropriate: allergies, current medications, past medical history, past social history, past surgical history and problem list.  Review of Systems Pertinent items are noted in HPI.    Objective:    BP 110/70  Pulse 88  Temp(Src) 97.8 F (36.6 C) (Temporal)  Ht 5' 5.17" (1.655 m)  Wt 131 lb 4 oz (59.535 kg)  BMI 21.74 kg/m2  SpO2 97% BP 110/70  Pulse 88  Temp(Src) 97.8 F (36.6 C) (Temporal)  Ht 5' 5.17" (1.655 m)  Wt 131 lb 4 oz (59.535 kg)  BMI 21.74 kg/m2  SpO2 97% General appearance: alert, cooperative, appears stated age and no distress Head: Normocephalic, without obvious abnormality, atraumatic Eyes: negative findings: lids and lashes normal, conjunctivae and sclerae normal, corneas clear and pupils equal, round, reactive to light and accomodation Ears: unable to visualize TM due to ceruminosis. Post wax removal: TM red, retraced, canals red. Throat: posterior pharynx & soft palpate erythematous Lungs: clear to auscultation bilaterally Heart: regular rate and rhythm, S1, S2 normal, no murmur, click, rub or gallop Abdomen: soft, non-tender; bowel sounds normal; no masses,  no organomegaly Extremities: extremities  normal, atraumatic, no cyanosis or edema and varicose veins noted R posterior gluteus max/medius atrophy Pulses: 2+ and symmetric Skin: Skin color, texture, turgor normal. No rashes or lesions    Assessment:   1. Need for pneumococcal vaccination - Pneumococcal conjugate vaccine 13-valent IM  2. Right hip pain - HYDROcodone-acetaminophen (NORCO) 10-325 MG per tablet; Take 1 tablet by mouth 2 (two) times daily as needed for moderate pain.  Dispense: 60 tablet; Refill: 0  3. Ceruminosis, bilateral  4. Unspecified vitamin D deficiency - Vit D  25 hydroxy (rtn osteoporosis monitoring)  5. Preventative health care - CBC - Comprehensive metabolic panel - Lipid panel - T4, free - TSH - Urinalysis, Routine w reflex microscopic - Vitamin B12 - Fecal occult blood, imunochemical  6. Otitis externa of both ears - neomycin-polymyxin-hydrocortisone (CORTISPORIN) otic solution; Place 4 drops into both ears 4 (four) times daily.  Dispense: 10 mL; Refill: 0  See problem list for complete A&P See pt instructions. F/u 1 mo if need pain med, o/w 1 year

## 2014-05-10 NOTE — Assessment & Plan Note (Signed)
Intolerant to supplements. Encourage to find low dose supplement, Level was 10. Check level today. Educated about diseases assoc w/low D.

## 2014-05-10 NOTE — Patient Instructions (Addendum)
Keep ears dry for 1 week-put cotton ball in ears before getting into shower, then remove. Use several drops vinegar in both ears twice daily. If ears become painful, start antibiotic drops. To keep ears clear, use syringe to flush ears with 1:1 solution of warm water & hydrogen peroxide 2-3 times weekly when getting into shower. Schedule mammogram when convenient. Get shingles vaccine when you are not sick, no fever, and at least 2 weeks after pneumococcal vaccine. You may go to any [facility] pharmacy. You do not need a prescription. Please try to find a vitamin D that you can take. Start with lowest dose of D3 you can find. Take daily with food for best absorption. You have not had a chest CT scan. You have had chest xrays. I recommend low dose chest CT to screen for lung cancer because you are a smoker. I will need to see you in 1 month if you need me to prescribe pain medicine.   Cerumen Impaction A cerumen impaction is when the wax in your ear forms a plug. This plug usually causes reduced hearing. Sometimes it also causes an earache or dizziness. Removing a cerumen impaction can be difficult and painful. The wax sticks to the ear canal. The canal is sensitive and bleeds easily. If you try to remove a heavy wax buildup with a cotton tipped swab, you may push it in further. Irrigation with water, suction, and small ear curettes may be used to clear out the wax. If the impaction is fixed to the skin in the ear canal, ear drops may be needed for a few days to loosen the wax. People who build up a lot of wax frequently can use ear wax removal products available in your local drugstore. SEEK MEDICAL CARE IF:  You develop an earache, increased hearing loss, or marked dizziness. Document Released: 10/23/2004 Document Revised: 12/08/2011 Document Reviewed: 12/13/2009 [facility] Patient Information 2014 [place], [place].  Our office will call you with lab results and any necessary follow up.  Pleasure to meet you!  Preventive Care for Adults, Female A healthy lifestyle and preventive care can promote health and wellness. Preventive health guidelines for women include the following key practices.  A routine yearly physical is a good way to check with your caregiver about your health and preventive screening. It is a chance to share any concerns and updates on your health, and to receive a thorough exam.  Visit your dentist for a routine exam and preventive care every 6 months. Brush your teeth twice a day and floss once a day. Good oral hygiene prevents tooth decay and gum disease.  The frequency of eye exams is based on your age, health, family medical history, use of contact lenses, and other factors. Follow your caregiver's recommendations for frequency of eye exams.  Eat a healthy diet. Foods like vegetables, fruits, whole grains, low-fat dairy products, and lean protein foods contain the nutrients you need without too many calories. Decrease your intake of foods high in solid fats, added sugars, and salt. Eat the right amount of calories for you.Get information about a proper diet from your caregiver, if necessary.  Regular physical exercise is one of the most important things you can do for your health. Most adults should get at least 150 minutes of moderate-intensity exercise (any activity that increases your heart rate and causes you to sweat) each week. In addition, most adults need muscle-strengthening exercises on 2 or more days a week.  Maintain a  healthy weight. The body mass index (BMI) is a screening tool to identify possible weight problems. It provides an estimate of body fat based on height and weight. Your caregiver can help determine your BMI, and can help you achieve or maintain a healthy weight.For adults 20 years and older:  A BMI below 18.5 is considered underweight.  A BMI of 18.5 to 24.9 is normal.  A BMI of 25 to 29.9 is considered overweight.  A BMI of  30 and above is considered obese.  Maintain normal blood lipids and cholesterol levels by exercising and minimizing your intake of saturated fat. Eat a balanced diet with plenty of fruit and vegetables. Blood tests for lipids and cholesterol should begin at age 49 and be repeated every 5 years. If your lipid or cholesterol levels are high, you are over 50, or you are at high risk for heart disease, you may need your cholesterol levels checked more frequently.Ongoing high lipid and cholesterol levels should be treated with medicines if diet and exercise are not effective.  If you smoke, find out from your caregiver how to quit. If you do not use tobacco, do not start.  Lung cancer screening is recommended for adults aged 44 80 years who are at high risk for developing lung cancer because of a history of smoking. Yearly low-dose computed tomography (CT) is recommended for people who have at least a 30-pack-year history of smoking and are a current smoker or have quit within the past 15 years. A pack year of smoking is smoking an average of 1 pack of cigarettes a day for 1 year (for example: 1 pack a day for 30 years or 2 packs a day for 15 years). Yearly screening should continue until the smoker has stopped smoking for at least 15 years. Yearly screening should also be stopped for people who develop a health problem that would prevent them from having lung cancer treatment.  If you are pregnant, do not drink alcohol. If you are breastfeeding, be very cautious about drinking alcohol. If you are not pregnant and choose to drink alcohol, do not exceed 1 drink per day. One drink is considered to be 12 ounces (355 mL) of beer, 5 ounces (148 mL) of wine, or 1.5 ounces (44 mL) of liquor.  Avoid use of street drugs. Do not share needles with anyone. Ask for help if you need support or instructions about stopping the use of drugs.  High blood pressure causes heart disease and increases the risk of stroke. Your  blood pressure should be checked at least every 1 to 2 years. Ongoing high blood pressure should be treated with medicines if weight loss and exercise are not effective.  If you are 35 to 70 years old, ask your caregiver if you should take aspirin to prevent strokes.  Diabetes screening involves taking a blood sample to check your fasting blood sugar level. This should be done once every 3 years, after age 66, if you are within normal weight and without risk factors for diabetes. Testing should be considered at a younger age or be carried out more frequently if you are overweight and have at least 1 risk factor for diabetes.  Breast cancer screening is essential preventive care for women. You should practice "breast self-awareness." This means understanding the normal appearance and feel of your breasts and may include breast self-examination. Any changes detected, no matter how small, should be reported to a caregiver. Women in their 24s and 30s should have  a clinical breast exam (CBE) by a caregiver as part of a regular health exam every 1 to 3 years. After age 25, women should have a CBE every year. Starting at age 66, women should consider having a mammography (breast X-ray test) every year. Women who have a family history of breast cancer should talk to their caregiver about genetic screening. Women at a high risk of breast cancer should talk to their caregivers about having magnetic resonance imaging (MRI) and a mammography every year.  Breast cancer gene (BRCA)-related cancer risk assessment is recommended for women who have family members with BRCA-related cancers. BRCA-related cancers include breast, ovarian, tubal, and peritoneal cancers. Having family members with these cancers may be associated with an increased risk for harmful changes (mutations) in the breast cancer genes BRCA1 and BRCA2. Results of the assessment will determine the need for genetic counseling and BRCA1 and BRCA2  testing.  The Pap test is a screening test for cervical cancer. A Pap test can show cell changes on the cervix that might become cervical cancer if left untreated. A Pap test is a procedure in which cells are obtained and examined from the lower end of the uterus (cervix).  Women should have a Pap test starting at age 59.  Between ages 3 and 29, Pap tests should be repeated every 2 years.  Beginning at age 27, you should have a Pap test every 3 years as long as the past 3 Pap tests have been normal.  Some women have medical problems that increase the chance of getting cervical cancer. Talk to your caregiver about these problems. It is especially important to talk to your caregiver if a new problem develops soon after your last Pap test. In these cases, your caregiver may recommend more frequent screening and Pap tests.  The above recommendations are the same for women who have or have not gotten the vaccine for human papillomavirus (HPV).  If you had a hysterectomy for a problem that was not cancer or a condition that could lead to cancer, then you no longer need Pap tests. Even if you no longer need a Pap test, a regular exam is a good idea to make sure no other problems are starting.  If you are between ages 91 and 30, and you have had normal Pap tests going back 10 years, you no longer need Pap tests. Even if you no longer need a Pap test, a regular exam is a good idea to make sure no other problems are starting.  If you have had past treatment for cervical cancer or a condition that could lead to cancer, you need Pap tests and screening for cancer for at least 20 years after your treatment.  If Pap tests have been discontinued, risk factors (such as a new sexual partner) need to be reassessed to determine if screening should be resumed.  The HPV test is an additional test that may be used for cervical cancer screening. The HPV test looks for the virus that can cause the cell changes on the  cervix. The cells collected during the Pap test can be tested for HPV. The HPV test could be used to screen women aged 39 years and older, and should be used in women of any age who have unclear Pap test results. After the age of 45, women should have HPV testing at the same frequency as a Pap test.  Colorectal cancer can be detected and often prevented. Most routine colorectal cancer screening begins  at the age of 66 and continues through age 63. However, your caregiver may recommend screening at an earlier age if you have risk factors for colon cancer. On a yearly basis, your caregiver may provide home test kits to check for hidden blood in the stool. Use of a small camera at the end of a tube, to directly examine the colon (sigmoidoscopy or colonoscopy), can detect the earliest forms of colorectal cancer. Talk to your caregiver about this at age 60, when routine screening begins. Direct examination of the colon should be repeated every 5 to 10 years through age 21, unless early forms of pre-cancerous polyps or small growths are found.  Hepatitis C blood testing is recommended for all people born from [birth year] through 1967 and any individual with known risks for hepatitis C.  Practice safe sex. Use condoms and avoid high-risk sexual practices to reduce the spread of sexually transmitted infections (STIs). STIs include gonorrhea, chlamydia, syphilis, trichomonas, herpes, HPV, and human immunodeficiency virus (HIV). Herpes, HIV, and HPV are viral illnesses that have no cure. They can result in disability, cancer, and death. Sexually active women aged 57 and younger should be checked for chlamydia. Older women with new or multiple partners should also be tested for chlamydia. Testing for other STIs is recommended if you are sexually active and at increased risk.  Osteoporosis is a disease in which the bones lose minerals and strength with aging. This can result in serious bone fractures. The risk of osteoporosis  can be identified using a bone density scan. Women ages 5 and over and women at risk for fractures or osteoporosis should discuss screening with their caregivers. Ask your caregiver whether you should take a calcium supplement or vitamin D to reduce the rate of osteoporosis.  Menopause can be associated with physical symptoms and risks. Hormone replacement therapy is available to decrease symptoms and risks. You should talk to your caregiver about whether hormone replacement therapy is right for you.  Use sunscreen. Apply sunscreen liberally and repeatedly throughout the day. You should seek shade when your shadow is shorter than you. Protect yourself by wearing long sleeves, pants, a wide-brimmed hat, and sunglasses year round, whenever you are outdoors.  Once a month, do a whole body skin exam, using a mirror to look at the skin on your back. Notify your caregiver of new moles, moles that have irregular borders, moles that are larger than a pencil eraser, or moles that have changed in shape or color.  Stay current with required immunizations.  Influenza vaccine. All adults should be immunized every year.  Tetanus, diphtheria, and acellular pertussis (Td, Tdap) vaccine. Pregnant women should receive 1 dose of Tdap vaccine during each pregnancy. The dose should be obtained regardless of the length of time since the last dose. Immunization is preferred during the 27th to 36th week of gestation. An adult who has not previously received Tdap or who does not know her vaccine status should receive 1 dose of Tdap. This initial dose should be followed by tetanus and diphtheria toxoids (Td) booster doses every 10 years. Adults with an unknown or incomplete history of completing a 3-dose immunization series with Td-containing vaccines should begin or complete a primary immunization series including a Tdap dose. Adults should receive a Td booster every 10 years.  Varicella vaccine. An adult without evidence of  immunity to varicella should receive 2 doses or a second dose if she has previously received 1 dose. Pregnant females who do not have evidence  of immunity should receive the first dose after pregnancy. This first dose should be obtained before leaving the health care facility. The second dose should be obtained 4 8 weeks after the first dose.  Human papillomavirus (HPV) vaccine. Females aged 65 26 years who have not received the vaccine previously should obtain the 3-dose series. The vaccine is not recommended for use in pregnant females. However, pregnancy testing is not needed before receiving a dose. If a female is found to be pregnant after receiving a dose, no treatment is needed. In that case, the remaining doses should be delayed until after the pregnancy. Immunization is recommended for any person with an immunocompromised condition through the age of 91 years if she did not get any or all doses earlier. During the 3-dose series, the second dose should be obtained 4 8 weeks after the first dose. The third dose should be obtained 24 weeks after the first dose and 16 weeks after the second dose.  Zoster vaccine. One dose is recommended for adults aged 53 years or older unless certain conditions are present.  Measles, mumps, and rubella (MMR) vaccine. Adults born before [birth year] generally are considered immune to measles and mumps. Adults born in [birth year] or later should have 1 or more doses of MMR vaccine unless there is a contraindication to the vaccine or there is laboratory evidence of immunity to each of the three diseases. A routine second dose of MMR vaccine should be obtained at least 28 days after the first dose for students attending postsecondary schools, health care workers, or international travelers. People who received inactivated measles vaccine or an unknown type of measles vaccine during 1963 1967 should receive 2 doses of MMR vaccine. People who received inactivated mumps vaccine or an unknown  type of mumps vaccine before 1979 and are at high risk for mumps infection should consider immunization with 2 doses of MMR vaccine. For females of childbearing age, rubella immunity should be determined. If there is no evidence of immunity, females who are not pregnant should be vaccinated. If there is no evidence of immunity, females who are pregnant should delay immunization until after pregnancy. Unvaccinated health care workers born before [birth year] who lack laboratory evidence of measles, mumps, or rubella immunity or laboratory confirmation of disease should consider measles and mumps immunization with 2 doses of MMR vaccine or rubella immunization with 1 dose of MMR vaccine.  Pneumococcal 13-valent conjugate (PCV13) vaccine. When indicated, a person who is uncertain of her immunization history and has no record of immunization should receive the PCV13 vaccine. An adult aged 59 years or older who has certain medical conditions and has not been previously immunized should receive 1 dose of PCV13 vaccine. This PCV13 should be followed with a dose of pneumococcal polysaccharide (PPSV23) vaccine. The PPSV23 vaccine dose should be obtained at least 8 weeks after the dose of PCV13 vaccine. An adult aged 36 years or older who has certain medical conditions and previously received 1 or more doses of PPSV23 vaccine should receive 1 dose of PCV13. The PCV13 vaccine dose should be obtained 1 or more years after the last PPSV23 vaccine dose.  Pneumococcal polysaccharide (PPSV23) vaccine. When PCV13 is also indicated, PCV13 should be obtained first. All adults aged 46 years and older should be immunized. An adult younger than age 45 years who has certain medical conditions should be immunized. Any person who resides in a nursing home or long-term care facility should be immunized. An adult smoker should be immunized.  People with an immunocompromised condition and certain other conditions should receive both PCV13 and  PPSV23 vaccines. People with human immunodeficiency virus (HIV) infection should be immunized as soon as possible after diagnosis. Immunization during chemotherapy or radiation therapy should be avoided. Routine use of PPSV23 vaccine is not recommended for American Indians, [place] Natives, or people younger than 65 years unless there are medical conditions that require PPSV23 vaccine. When indicated, people who have unknown immunization and have no record of immunization should receive PPSV23 vaccine. One-time revaccination 5 years after the first dose of PPSV23 is recommended for people aged 55 64 years who have chronic kidney failure, nephrotic syndrome, asplenia, or immunocompromised conditions. People who received 1 2 doses of PPSV23 before age 39 years should receive another dose of PPSV23 vaccine at age 73 years or later if at least 5 years have passed since the previous dose. Doses of PPSV23 are not needed for people immunized with PPSV23 at or after age 32 years.  Meningococcal vaccine. Adults with asplenia or persistent complement component deficiencies should receive 2 doses of quadrivalent meningococcal conjugate (MenACWY-D) vaccine. The doses should be obtained at least 2 months apart. Microbiologists working with certain meningococcal bacteria, [employer], people at risk during an outbreak, and people who travel to or live in countries with a high rate of meningitis should be immunized. A first-year college student up through age 55 years who is living in a residence hall should receive a dose if she did not receive a dose on or after her 16th birthday. Adults who have certain high-risk conditions should receive one or more doses of vaccine.  Hepatitis A vaccine. Adults who wish to be protected from this disease, have certain high-risk conditions, work with hepatitis A-infected animals, work in hepatitis A research labs, or travel to or work in countries with a high rate of hepatitis A should  be immunized. Adults who were previously unvaccinated and who anticipate close contact with an international adoptee during the first 60 days after arrival in the [place] from a country with a high rate of hepatitis A should be immunized.  Hepatitis B vaccine. Adults who wish to be protected from this disease, have certain high-risk conditions, may be exposed to blood or other infectious body fluids, are household contacts or sex partners of hepatitis B positive people, are clients or workers in certain care facilities, or travel to or work in countries with a high rate of hepatitis B should be immunized.  Haemophilus influenzae type b (Hib) vaccine. A previously unvaccinated person with asplenia or sickle cell disease or having a scheduled splenectomy should receive 1 dose of Hib vaccine. Regardless of previous immunization, a recipient of a hematopoietic stem cell transplant should receive a 3-dose series 6 12 months after her successful transplant. Hib vaccine is not recommended for adults with HIV infection. Preventive Services / Frequency Ages 31 to 108  Blood pressure check.** / Every 1 to 2 years.  Lipid and cholesterol check.** / Every 5 years beginning at age 45.  Clinical breast exam.** / Every 3 years for women in their 90s and 94s.  BRCA-related cancer risk assessment.** / For women who have family members with a BRCA-related cancer (breast, ovarian, tubal, or peritoneal cancers).  Pap test.** / Every 2 years from ages 2 through 72. Every 3 years starting at age 69 through age 50 or 48 with a history of 3 consecutive normal Pap tests.  HPV screening.** / Every 3 years from ages 44  through ages 65 to 26 with a history of 3 consecutive normal Pap tests.  Hepatitis C blood test.** / For any individual with known risks for hepatitis C.  Skin self-exam. / Monthly.  Influenza vaccine. / Every year.  Tetanus, diphtheria, and acellular pertussis (Tdap, Td) vaccine.** / Consult  your caregiver. Pregnant women should receive 1 dose of Tdap vaccine during each pregnancy. 1 dose of Td every 10 years.  Varicella vaccine.** / Consult your caregiver. Pregnant females who do not have evidence of immunity should receive the first dose after pregnancy.  HPV vaccine. / 3 doses over 6 months, if 85 and younger. The vaccine is not recommended for use in pregnant females. However, pregnancy testing is not needed before receiving a dose.  Measles, mumps, rubella (MMR) vaccine.** / You need at least 1 dose of MMR if you were born in 1957 or later. You may also need a 2nd dose. For females of childbearing age, rubella immunity should be determined. If there is no evidence of immunity, females who are not pregnant should be vaccinated. If there is no evidence of immunity, females who are pregnant should delay immunization until after pregnancy.  Pneumococcal 13-valent conjugate (PCV13) vaccine.** / Consult your caregiver.  Pneumococcal polysaccharide (PPSV23) vaccine.** / 1 to 2 doses if you smoke cigarettes or if you have certain conditions.  Meningococcal vaccine.** / 1 dose if you are age 12 to 68 years and a Market researcher living in a residence hall, or have one of several medical conditions, you need to get vaccinated against meningococcal disease. You may also need additional booster doses.  Hepatitis A vaccine.** / Consult your caregiver.  Hepatitis B vaccine.** / Consult your caregiver.  Haemophilus influenzae type b (Hib) vaccine.** / Consult your caregiver. Ages 39 to 22  Blood pressure check.** / Every 1 to 2 years.  Lipid and cholesterol check.** / Every 5 years beginning at age 107.  Lung cancer screening. / Every year if you are aged 54 80 years and have a 30-pack-year history of smoking and currently smoke or have quit within the past 15 years. Yearly screening is stopped once you have quit smoking for at least 15 years or develop a health problem that  would prevent you from having lung cancer treatment.  Clinical breast exam.** / Every year after age 82.  BRCA-related cancer risk assessment.** / For women who have family members with a BRCA-related cancer (breast, ovarian, tubal, or peritoneal cancers).  Mammogram.** / Every year beginning at age 71 and continuing for as long as you are in good health. Consult with your caregiver.  Pap test.** / Every 3 years starting at age 75 through age 11 or 11 with a history of 3 consecutive normal Pap tests.  HPV screening.** / Every 3 years from ages 52 through ages 72 to 69 with a history of 3 consecutive normal Pap tests.  Fecal occult blood test (FOBT) of stool. / Every year beginning at age 56 and continuing until age 41. You may not need to do this test if you get a colonoscopy every 10 years.  Flexible sigmoidoscopy or colonoscopy.** / Every 5 years for a flexible sigmoidoscopy or every 10 years for a colonoscopy beginning at age 49 and continuing until age 65.  Hepatitis C blood test.** / For all people born from [birth year] through 1965 and any individual with known risks for hepatitis C.  Skin self-exam. / Monthly.  Influenza vaccine. / Every year.  Tetanus, diphtheria, and  acellular pertussis (Tdap/Td) vaccine.** / Consult your caregiver. Pregnant women should receive 1 dose of Tdap vaccine during each pregnancy. 1 dose of Td every 10 years.  Varicella vaccine.** / Consult your caregiver. Pregnant females who do not have evidence of immunity should receive the first dose after pregnancy.  Zoster vaccine.** / 1 dose for adults aged 63 years or older.  Measles, mumps, rubella (MMR) vaccine.** / You need at least 1 dose of MMR if you were born in 1957 or later. You may also need a 2nd dose. For females of childbearing age, rubella immunity should be determined. If there is no evidence of immunity, females who are not pregnant should be vaccinated. If there is no evidence of immunity, females  who are pregnant should delay immunization until after pregnancy.  Pneumococcal 13-valent conjugate (PCV13) vaccine.** / Consult your caregiver.  Pneumococcal polysaccharide (PPSV23) vaccine.** / 1 to 2 doses if you smoke cigarettes or if you have certain conditions.  Meningococcal vaccine.** / Consult your caregiver.  Hepatitis A vaccine.** / Consult your caregiver.  Hepatitis B vaccine.** / Consult your caregiver.  Haemophilus influenzae type b (Hib) vaccine.** / Consult your caregiver. Ages 23 and over  Blood pressure check.** / Every 1 to 2 years.  Lipid and cholesterol check.** / Every 5 years beginning at age 45.  Lung cancer screening. / Every year if you are aged 87 80 years and have a 30-pack-year history of smoking and currently smoke or have quit within the past 15 years. Yearly screening is stopped once you have quit smoking for at least 15 years or develop a health problem that would prevent you from having lung cancer treatment.  Clinical breast exam.** / Every year after age 71.  BRCA-related cancer risk assessment.** / For women who have family members with a BRCA-related cancer (breast, ovarian, tubal, or peritoneal cancers).  Mammogram.** / Every year beginning at age 93 and continuing for as long as you are in good health. Consult with your caregiver.  Pap test.** / Every 3 years starting at age 3 through age 63 or 44 with a 3 consecutive normal Pap tests. Testing can be stopped between 65 and 70 with 3 consecutive normal Pap tests and no abnormal Pap or HPV tests in the past 10 years.  HPV screening.** / Every 3 years from ages 58 through ages 49 or 84 with a history of 3 consecutive normal Pap tests. Testing can be stopped between 65 and 70 with 3 consecutive normal Pap tests and no abnormal Pap or HPV tests in the past 10 years.  Fecal occult blood test (FOBT) of stool. / Every year beginning at age 34 and continuing until age 101. You may not need to do this  test if you get a colonoscopy every 10 years.  Flexible sigmoidoscopy or colonoscopy.** / Every 5 years for a flexible sigmoidoscopy or every 10 years for a colonoscopy beginning at age 21 and continuing until age 55.  Hepatitis C blood test.** / For all people born from [birth year] through 1965 and any individual with known risks for hepatitis C.  Osteoporosis screening.** / A one-time screening for women ages 32 and over and women at risk for fractures or osteoporosis.  Skin self-exam. / Monthly.  Influenza vaccine. / Every year.  Tetanus, diphtheria, and acellular pertussis (Tdap/Td) vaccine.** / 1 dose of Td every 10 years.  Varicella vaccine.** / Consult your caregiver.  Zoster vaccine.** / 1 dose for adults aged 14 years or older.  Pneumococcal 13-valent conjugate (PCV13) vaccine.** / Consult your caregiver.  Pneumococcal polysaccharide (PPSV23) vaccine.** / 1 dose for all adults aged 51 years and older.  Meningococcal vaccine.** / Consult your caregiver.  Hepatitis A vaccine.** / Consult your caregiver.  Hepatitis B vaccine.** / Consult your caregiver.  Haemophilus influenzae type b (Hib) vaccine.** / Consult your caregiver. ** Family history and personal history of risk and conditions may change your caregiver's recommendations. Document Released: 11/11/2001 Document Revised: 01/10/2013 Document Reviewed: 02/10/2011 [facility] Patient Information 2014 [place], [place].

## 2014-05-11 ENCOUNTER — Telehealth: Payer: Self-pay | Admitting: Nurse Practitioner

## 2014-05-11 ENCOUNTER — Other Ambulatory Visit: Payer: Self-pay | Admitting: Nurse Practitioner

## 2014-05-11 DIAGNOSIS — E538 Deficiency of other specified B group vitamins: Secondary | ICD-10-CM

## 2014-05-11 MED ORDER — CYANOCOBALAMIN 1000 MCG/ML IJ SOLN: 1000.0000 ug | INTRAMUSCULAR | Status: DC

## 2014-05-11 NOTE — Telephone Encounter (Signed)
pls call pt: Advise B12 low, will start weekly injections X 4, then oral B complex. Schedule CMA appt. For B12 injections qw X 3 wks, then OV for the 4th week.  Vit D has doubled-it's 25, but needs to double again. So sit in sun for 15 minutes daily & try to find D3 supplement that she can tolerate.

## 2014-05-11 NOTE — Telephone Encounter (Signed)
Yes. I will send script to pharmacy.

## 2014-05-11 NOTE — Telephone Encounter (Signed)
Please advise 

## 2014-05-11 NOTE — Telephone Encounter (Signed)
Patient was notified of results. Patient scheduled 1st b-12 injection for 05/23/2014 at 10:30 am.

## 2014-05-11 NOTE — Telephone Encounter (Signed)
Khristy/patient Phone [phone].  Was contacted 05/11/14 and advised needs a series of Vitamin B-12 injections.  Called back to ask if she can administer her own injections since coming to the office involves a 2 hour drive.  She says she knows how to give IM injections.  She also administered her own and her neighbor's Heparin (sub-q) injections after surgery. Please call back to advise.

## 2014-05-11 NOTE — Telephone Encounter (Signed)
Left detailed message on vm.

## 2014-05-19 ENCOUNTER — Telehealth: Payer: Self-pay | Admitting: *Deleted

## 2014-05-19 NOTE — Telephone Encounter (Signed)
Patient left vm stating that her insurance will not pay for her b-12 injections in office or at home. Patient wanted to know what else she could do?

## 2014-05-19 NOTE — Telephone Encounter (Signed)
Returned pt's call to let her know we received her vm.informed pt message will be sent to [facility]. Also explained to pt that Layne will be out of office until Monday.

## 2014-05-22 NOTE — Telephone Encounter (Signed)
Yes, she can take B complex daily to get 2400 mcg B12.

## 2014-05-23 ENCOUNTER — Ambulatory Visit: Payer: Medicare Other

## 2014-05-23 NOTE — Telephone Encounter (Signed)
Spoke with pt, advised message from Layne. Pt understood. 

## 2014-05-29 ENCOUNTER — Telehealth: Payer: Self-pay | Admitting: Nurse Practitioner

## 2014-05-29 NOTE — Telephone Encounter (Signed)
Caller: Lilya/Patient; Phone: [phone]; Reason for Call: Pt called to inform us that Medicaid is sending an FL2 form to complete.  This form is for pt to qualify for help after right hip replacement d/t recall (sister to stay with her).  Surgery has not been scheduled and pt is scheduled to have MRI today and then return appointment to surgeon.  Please call pt if more information is needed.

## 2014-05-30 ENCOUNTER — Ambulatory Visit: Payer: Medicare Other

## 2014-06-02 NOTE — Telephone Encounter (Signed)
Pt should have orthopedist fill out this paperwork. I do not have a diagnosis code because I am not the treating provider for the hip.

## 2014-06-06 ENCOUNTER — Ambulatory Visit: Payer: Medicare Other

## 2014-06-13 ENCOUNTER — Ambulatory Visit: Payer: Medicare Other | Admitting: Nurse Practitioner

## 2014-06-16 NOTE — Telephone Encounter (Signed)
Patient called also regarding this. Best # [phone]

## 2014-06-16 NOTE — Telephone Encounter (Signed)
Sherian Maroon, social workr with [employer] [place] states that this form needs to be filled out by pcp. Please contact Butch Penny at [phone] ext [phone]. Okay to leave detailed message.

## 2014-06-16 NOTE — Telephone Encounter (Signed)
Spoke with pt, she states you are her PCP and they are required to fill out the paperwork. I still have the forms at my desk.

## 2014-06-16 NOTE — Telephone Encounter (Signed)
pls call pt: Advise I'll do the best i can, but I do not know the details about recovering from hip surgery.

## 2014-06-19 ENCOUNTER — Telehealth: Payer: Self-pay | Admitting: Nurse Practitioner

## 2014-06-19 NOTE — Telephone Encounter (Signed)
See ph note

## 2014-06-28 ENCOUNTER — Telehealth: Payer: Self-pay | Admitting: *Deleted

## 2014-06-28 NOTE — Telephone Encounter (Signed)
Shirlean Mylar, sister of Adore, expresses that she is upset with me because Shirin was turned down for additional social assistance. She wants me to fill out form stating what her post-surgical needs are. I am not able to comment because I do not know what her needs will be. I suggested she get a letter from the surgeon stating what her recovery will look like & forward to me. Robin's response is "Now we cannot do the things we wanted to do". She did not elaborate on that statement.  I offered to review the surgeon's note & contact social services again if that would be helpful.

## 2014-06-28 NOTE — Telephone Encounter (Signed)
Tami Lin called requesting a cb from [facility] concerning pt.

## 2014-07-25 ENCOUNTER — Ambulatory Visit: Payer: Medicare Other | Admitting: Physical Therapy

## 2014-08-01 ENCOUNTER — Ambulatory Visit: Payer: Medicare Other | Attending: Adult Reconstructive Orthopaedic Surgery | Admitting: Physical Therapy

## 2014-08-01 DIAGNOSIS — M25559 Pain in unspecified hip: Secondary | ICD-10-CM | POA: Diagnosis not present

## 2014-08-01 DIAGNOSIS — Z96643 Presence of artificial hip joint, bilateral: Secondary | ICD-10-CM | POA: Insufficient documentation

## 2014-08-01 DIAGNOSIS — Z981 Arthrodesis status: Secondary | ICD-10-CM | POA: Diagnosis not present

## 2014-08-01 DIAGNOSIS — Z5189 Encounter for other specified aftercare: Secondary | ICD-10-CM | POA: Insufficient documentation

## 2014-08-01 DIAGNOSIS — R5381 Other malaise: Secondary | ICD-10-CM | POA: Diagnosis not present

## 2014-08-08 ENCOUNTER — Encounter: Payer: Medicare Other | Admitting: Physical Therapy

## 2014-08-09 DIAGNOSIS — Z981 Arthrodesis status: Secondary | ICD-10-CM

## 2014-08-09 DIAGNOSIS — G894 Chronic pain syndrome: Secondary | ICD-10-CM | POA: Insufficient documentation

## 2014-08-09 HISTORY — DX: Arthrodesis status: Z98.1

## 2014-08-15 ENCOUNTER — Ambulatory Visit: Payer: Medicare Other | Admitting: Physical Therapy

## 2014-08-15 DIAGNOSIS — Z5189 Encounter for other specified aftercare: Secondary | ICD-10-CM | POA: Diagnosis not present

## 2014-08-29 ENCOUNTER — Telehealth: Payer: Self-pay

## 2014-08-29 NOTE — Telephone Encounter (Signed)
[employer] called and states pt was discharged today from [facility]. The notes are in your box.

## 2014-09-29 HISTORY — PX: TOTAL HIP ARTHROPLASTY: SHX124

## 2014-10-05 DIAGNOSIS — Z5181 Encounter for therapeutic drug level monitoring: Secondary | ICD-10-CM | POA: Diagnosis not present

## 2014-10-05 DIAGNOSIS — Z96641 Presence of right artificial hip joint: Secondary | ICD-10-CM | POA: Diagnosis not present

## 2014-10-05 DIAGNOSIS — Z7901 Long term (current) use of anticoagulants: Secondary | ICD-10-CM | POA: Diagnosis not present

## 2014-10-05 DIAGNOSIS — F17210 Nicotine dependence, cigarettes, uncomplicated: Secondary | ICD-10-CM | POA: Diagnosis not present

## 2014-10-05 DIAGNOSIS — Z96642 Presence of left artificial hip joint: Secondary | ICD-10-CM | POA: Diagnosis not present

## 2014-10-05 DIAGNOSIS — Z471 Aftercare following joint replacement surgery: Secondary | ICD-10-CM | POA: Diagnosis not present

## 2014-10-05 DIAGNOSIS — Z4732 Aftercare following explantation of hip joint prosthesis: Secondary | ICD-10-CM | POA: Diagnosis not present

## 2014-10-05 DIAGNOSIS — Z9889 Other specified postprocedural states: Secondary | ICD-10-CM | POA: Diagnosis not present

## 2014-10-05 DIAGNOSIS — Z981 Arthrodesis status: Secondary | ICD-10-CM | POA: Diagnosis not present

## 2014-10-10 DIAGNOSIS — Z96641 Presence of right artificial hip joint: Secondary | ICD-10-CM | POA: Diagnosis not present

## 2014-10-10 DIAGNOSIS — Z4732 Aftercare following explantation of hip joint prosthesis: Secondary | ICD-10-CM | POA: Diagnosis not present

## 2014-10-12 DIAGNOSIS — Z4732 Aftercare following explantation of hip joint prosthesis: Secondary | ICD-10-CM | POA: Diagnosis not present

## 2014-10-12 DIAGNOSIS — Z96641 Presence of right artificial hip joint: Secondary | ICD-10-CM | POA: Diagnosis not present

## 2014-10-17 DIAGNOSIS — Z4732 Aftercare following explantation of hip joint prosthesis: Secondary | ICD-10-CM | POA: Diagnosis not present

## 2014-10-17 DIAGNOSIS — Z96641 Presence of right artificial hip joint: Secondary | ICD-10-CM | POA: Diagnosis not present

## 2014-10-19 DIAGNOSIS — Z4732 Aftercare following explantation of hip joint prosthesis: Secondary | ICD-10-CM | POA: Diagnosis not present

## 2014-10-19 DIAGNOSIS — Z96641 Presence of right artificial hip joint: Secondary | ICD-10-CM | POA: Diagnosis not present

## 2014-10-19 DIAGNOSIS — Z471 Aftercare following joint replacement surgery: Secondary | ICD-10-CM | POA: Diagnosis not present

## 2014-10-24 DIAGNOSIS — Z4732 Aftercare following explantation of hip joint prosthesis: Secondary | ICD-10-CM | POA: Diagnosis not present

## 2014-10-24 DIAGNOSIS — Z96641 Presence of right artificial hip joint: Secondary | ICD-10-CM | POA: Diagnosis not present

## 2014-10-26 DIAGNOSIS — Z96641 Presence of right artificial hip joint: Secondary | ICD-10-CM | POA: Diagnosis not present

## 2014-10-26 DIAGNOSIS — Z4732 Aftercare following explantation of hip joint prosthesis: Secondary | ICD-10-CM | POA: Diagnosis not present

## 2014-10-31 DIAGNOSIS — Z96641 Presence of right artificial hip joint: Secondary | ICD-10-CM | POA: Diagnosis not present

## 2014-10-31 DIAGNOSIS — Z4732 Aftercare following explantation of hip joint prosthesis: Secondary | ICD-10-CM | POA: Diagnosis not present

## 2014-11-02 DIAGNOSIS — Z96641 Presence of right artificial hip joint: Secondary | ICD-10-CM | POA: Diagnosis not present

## 2014-11-02 DIAGNOSIS — Z4732 Aftercare following explantation of hip joint prosthesis: Secondary | ICD-10-CM | POA: Diagnosis not present

## 2014-11-07 DIAGNOSIS — Z4732 Aftercare following explantation of hip joint prosthesis: Secondary | ICD-10-CM | POA: Diagnosis not present

## 2014-11-07 DIAGNOSIS — Z96641 Presence of right artificial hip joint: Secondary | ICD-10-CM | POA: Diagnosis not present

## 2014-11-09 DIAGNOSIS — Z4732 Aftercare following explantation of hip joint prosthesis: Secondary | ICD-10-CM | POA: Diagnosis not present

## 2014-11-09 DIAGNOSIS — Z96641 Presence of right artificial hip joint: Secondary | ICD-10-CM | POA: Diagnosis not present

## 2014-11-14 DIAGNOSIS — Z4732 Aftercare following explantation of hip joint prosthesis: Secondary | ICD-10-CM | POA: Diagnosis not present

## 2014-11-14 DIAGNOSIS — Z96641 Presence of right artificial hip joint: Secondary | ICD-10-CM | POA: Diagnosis not present

## 2014-11-16 DIAGNOSIS — Z96641 Presence of right artificial hip joint: Secondary | ICD-10-CM | POA: Diagnosis not present

## 2014-11-16 DIAGNOSIS — Z4732 Aftercare following explantation of hip joint prosthesis: Secondary | ICD-10-CM | POA: Diagnosis not present

## 2014-11-30 DIAGNOSIS — Z96643 Presence of artificial hip joint, bilateral: Secondary | ICD-10-CM | POA: Diagnosis not present

## 2014-11-30 DIAGNOSIS — F17210 Nicotine dependence, cigarettes, uncomplicated: Secondary | ICD-10-CM | POA: Diagnosis not present

## 2014-11-30 DIAGNOSIS — Z471 Aftercare following joint replacement surgery: Secondary | ICD-10-CM | POA: Diagnosis not present

## 2014-11-30 DIAGNOSIS — Z96641 Presence of right artificial hip joint: Secondary | ICD-10-CM | POA: Diagnosis not present

## 2014-12-04 ENCOUNTER — Encounter: Payer: Self-pay | Admitting: Vascular Surgery

## 2014-12-05 ENCOUNTER — Ambulatory Visit (INDEPENDENT_AMBULATORY_CARE_PROVIDER_SITE_OTHER): Payer: Medicare Other | Admitting: Vascular Surgery

## 2014-12-05 ENCOUNTER — Encounter: Payer: Self-pay | Admitting: Vascular Surgery

## 2014-12-05 ENCOUNTER — Ambulatory Visit (HOSPITAL_COMMUNITY)
Admission: RE | Admit: 2014-12-05 | Discharge: 2014-12-05 | Disposition: A | Discharge status: Home / Self Care | Payer: Medicare Other | Source: Ambulatory Visit | Attending: Vascular Surgery | Admitting: Vascular Surgery

## 2014-12-05 VITALS — BP 124/81 | HR 89 | Resp 14 | Ht 66.0 in | Wt 118.8 lb

## 2014-12-05 DIAGNOSIS — I714 Abdominal aortic aneurysm, without rupture, unspecified: Secondary | ICD-10-CM

## 2014-12-05 NOTE — Addendum Note (Signed)
Addended by: Mena Goes on: 12/05/2014 03:35 PM   Modules accepted: Orders

## 2014-12-05 NOTE — Progress Notes (Signed)
Subjective:     Patient ID: [name], female   DOB: June 29, 1946, 69 y.o.   MRN: [id number]  HPI this 69 year old female returns for continued follow-up regarding her small.aortic aneurysm which was discovered on CT scanning one year ago. At that time it was 2.7 cm in maximum diameter. She does have a history of rupture of an aortic aneurysm in her mother and grandmother and is quite concerned about this. She is not had any abdominal or back symptoms. She denies any claudication type symptoms.  Past Medical History  Diagnosis Date  . Chicken pox   . Mitral valve prolapse   . Contact lens/glasses fitting     wears contacts or glasses  . IBS (irritable bowel syndrome)     diet contolled  . Seasonal allergies   . Arthritis   . DDD (degenerative disc disease), lumbar   . AAA (abdominal aortic aneurysm)     saw Dr Kellie Simmering 11/2013.  2.7 cm  . History of blood transfusion     History  Substance Use Topics  . Smoking status: Current Every Day Smoker -- 0.50 packs/day for 30 years    Types: Cigarettes  . Smokeless tobacco: Never Used  . Alcohol Use: 0.0 oz/week    0 Standard drinks or equivalent per week     Comment: very occasional, not monthly    Family History  Problem Relation Age of Onset  . Early death Mother     aortic aneurysm  . Vasculitis Mother     temporal arteritis  . Aortic aneurysm Mother   . Heart disease Mother     before age 72  . Hyperlipidemia Mother   . Hypertension Mother   . COPD Sister   . Hypertension Sister   . Mental illness Sister   . Depression Sister   . Heart disease Sister     before age 48  . Cancer Maternal Aunt 84    brain  . Heart disease Maternal Grandmother     aortic aneurysm  . Aortic aneurysm Maternal Grandmother   . Hypertension Sister     No Known Allergies   Current outpatient prescriptions:  .  cetirizine (ZYRTEC) 10 MG tablet, Take 10 mg by mouth daily., Disp: , Rfl:  .  cyanocobalamin (,VITAMIN B-12,) 1000 MCG/ML  injection, Inject 1 mL (1,000 mcg total) into the muscle every 7 (seven) days. (Patient not taking: Reported on 12/05/2014), Disp: 1 mL, Rfl: 3 .  diazepam (VALIUM) 5 MG tablet, Take 1 tablet (5 mg total) by mouth every 6 (six) hours as needed for muscle spasms. (Patient not taking: Reported on 12/05/2014), Disp: 60 tablet, Rfl: 0 .  HYDROcodone-acetaminophen (NORCO) 10-325 MG per tablet, Take 1 tablet by mouth 2 (two) times daily as needed for moderate pain. (Patient not taking: Reported on 12/05/2014), Disp: 60 tablet, Rfl: 0 .  neomycin-polymyxin-hydrocortisone (CORTISPORIN) otic solution, Place 4 drops into both ears 4 (four) times daily. (Patient not taking: Reported on 12/05/2014), Disp: 10 mL, Rfl: 0  BP 124/81 mmHg  Pulse 89  Resp 14  Ht 5\' 6"  (1.676 m)  Wt 118 lb 12.8 oz (53.887 kg)  BMI 19.18 kg/m2  Body mass index is 19.18 kg/(m^2).           Review of Systems she had spine surgery performed by Dr. Joya Salm last April and has also had a hip replacement in the last 6 months. Denies lateralizing weakness, aphasia, amaurosis fugax, diplopia, blurred vision, syncope. All other systems negative  and complete review of systems     Objective:   Physical Exam BP 124/81 mmHg  Pulse 89  Resp 14  Ht 5\' 6"  (1.676 m)  Wt 118 lb 12.8 oz (53.887 kg)  BMI 19.18 kg/m2  Gen. well-developed well-nourished female no apparent stress alert and oriented 3 Lungs no rhonchi or wheezing Cardiovascular regular rhythm no murmurs Abdomen soft nontender with very small pulsatile mass in the 3 cm range which is nontender. Extremities 3+ femoral and posterior tibial pulse on the right and 3+ femoral and dorsalis pedis pulse on the left.  Today I ordered a duplex scan of the abdominal aorta which I reviewed and interpreted. The maximum diameter of the aorta is 2.9 cm      Assessment:     Small abdominal aortic aneurysm-2.9 cm by duplex scanning and 2.7 cm by CT scan one year ago-asymptomatic    Plan:      Return in 2 years with duplex scan of abdominal aorta and see nurse practitioner. If patient develops severe abdominal or flank pain she will report to emergency department. Patient has ongoing tobacco abuse and she was advised that cigarette smoking is high risk factor for abdominal aortic aneurysms

## 2014-12-27 DIAGNOSIS — F17210 Nicotine dependence, cigarettes, uncomplicated: Secondary | ICD-10-CM | POA: Diagnosis not present

## 2014-12-27 DIAGNOSIS — Z7901 Long term (current) use of anticoagulants: Secondary | ICD-10-CM | POA: Diagnosis not present

## 2014-12-27 DIAGNOSIS — Z7982 Long term (current) use of aspirin: Secondary | ICD-10-CM | POA: Diagnosis not present

## 2014-12-27 DIAGNOSIS — Z96641 Presence of right artificial hip joint: Secondary | ICD-10-CM | POA: Diagnosis not present

## 2014-12-27 DIAGNOSIS — R531 Weakness: Secondary | ICD-10-CM | POA: Diagnosis not present

## 2014-12-27 DIAGNOSIS — Z9049 Acquired absence of other specified parts of digestive tract: Secondary | ICD-10-CM | POA: Diagnosis not present

## 2014-12-27 DIAGNOSIS — Z96643 Presence of artificial hip joint, bilateral: Secondary | ICD-10-CM | POA: Diagnosis not present

## 2014-12-27 DIAGNOSIS — G894 Chronic pain syndrome: Secondary | ICD-10-CM | POA: Diagnosis not present

## 2014-12-27 DIAGNOSIS — Z471 Aftercare following joint replacement surgery: Secondary | ICD-10-CM | POA: Diagnosis not present

## 2014-12-27 DIAGNOSIS — Z9181 History of falling: Secondary | ICD-10-CM | POA: Diagnosis not present

## 2014-12-27 DIAGNOSIS — Z90710 Acquired absence of both cervix and uterus: Secondary | ICD-10-CM | POA: Diagnosis not present

## 2014-12-27 DIAGNOSIS — Z79899 Other long term (current) drug therapy: Secondary | ICD-10-CM | POA: Diagnosis not present

## 2014-12-27 DIAGNOSIS — Z981 Arthrodesis status: Secondary | ICD-10-CM | POA: Diagnosis not present

## 2014-12-27 DIAGNOSIS — M7631 Iliotibial band syndrome, right leg: Secondary | ICD-10-CM | POA: Diagnosis not present

## 2014-12-27 DIAGNOSIS — R413 Other amnesia: Secondary | ICD-10-CM | POA: Diagnosis not present

## 2014-12-27 DIAGNOSIS — R296 Repeated falls: Secondary | ICD-10-CM | POA: Diagnosis not present

## 2015-01-11 DIAGNOSIS — M5136 Other intervertebral disc degeneration, lumbar region: Secondary | ICD-10-CM | POA: Diagnosis not present

## 2015-04-05 ENCOUNTER — Encounter: Payer: Self-pay | Admitting: *Deleted

## 2015-04-30 DIAGNOSIS — M545 Low back pain: Secondary | ICD-10-CM | POA: Diagnosis not present

## 2015-05-01 ENCOUNTER — Other Ambulatory Visit: Payer: Self-pay

## 2015-05-01 DIAGNOSIS — Z1231 Encounter for screening mammogram for malignant neoplasm of breast: Secondary | ICD-10-CM

## 2015-05-25 DIAGNOSIS — H578 Other specified disorders of eye and adnexa: Secondary | ICD-10-CM | POA: Diagnosis not present

## 2015-05-29 DIAGNOSIS — M545 Low back pain: Secondary | ICD-10-CM | POA: Diagnosis not present

## 2015-05-29 DIAGNOSIS — M5136 Other intervertebral disc degeneration, lumbar region: Secondary | ICD-10-CM | POA: Diagnosis not present

## 2015-06-08 ENCOUNTER — Ambulatory Visit
Admission: RE | Admit: 2015-06-08 | Discharge: 2015-06-08 | Disposition: A | Discharge status: Home / Self Care | Payer: Medicare Other | Source: Ambulatory Visit

## 2015-06-08 ENCOUNTER — Ambulatory Visit: Payer: Medicare Other

## 2015-06-08 DIAGNOSIS — Z1231 Encounter for screening mammogram for malignant neoplasm of breast: Secondary | ICD-10-CM

## 2015-06-21 DIAGNOSIS — M47817 Spondylosis without myelopathy or radiculopathy, lumbosacral region: Secondary | ICD-10-CM | POA: Insufficient documentation

## 2015-07-02 ENCOUNTER — Encounter: Payer: Self-pay | Admitting: Family Medicine

## 2015-07-13 DIAGNOSIS — M5136 Other intervertebral disc degeneration, lumbar region: Secondary | ICD-10-CM | POA: Diagnosis not present

## 2015-07-13 DIAGNOSIS — M545 Low back pain: Secondary | ICD-10-CM | POA: Diagnosis not present

## 2015-07-13 DIAGNOSIS — M47817 Spondylosis without myelopathy or radiculopathy, lumbosacral region: Secondary | ICD-10-CM | POA: Diagnosis not present

## 2015-08-09 DIAGNOSIS — M47817 Spondylosis without myelopathy or radiculopathy, lumbosacral region: Secondary | ICD-10-CM | POA: Diagnosis not present

## 2015-09-04 DIAGNOSIS — M47817 Spondylosis without myelopathy or radiculopathy, lumbosacral region: Secondary | ICD-10-CM | POA: Diagnosis not present

## 2015-09-04 DIAGNOSIS — M545 Low back pain: Secondary | ICD-10-CM | POA: Diagnosis not present

## 2015-09-05 DIAGNOSIS — Z471 Aftercare following joint replacement surgery: Secondary | ICD-10-CM | POA: Diagnosis not present

## 2015-09-05 DIAGNOSIS — Z96643 Presence of artificial hip joint, bilateral: Secondary | ICD-10-CM | POA: Diagnosis not present

## 2015-09-05 DIAGNOSIS — Z96641 Presence of right artificial hip joint: Secondary | ICD-10-CM | POA: Diagnosis not present

## 2015-09-05 DIAGNOSIS — F17210 Nicotine dependence, cigarettes, uncomplicated: Secondary | ICD-10-CM | POA: Diagnosis not present

## 2015-10-09 DIAGNOSIS — M5136 Other intervertebral disc degeneration, lumbar region: Secondary | ICD-10-CM | POA: Diagnosis not present

## 2015-10-09 DIAGNOSIS — M47817 Spondylosis without myelopathy or radiculopathy, lumbosacral region: Secondary | ICD-10-CM | POA: Diagnosis not present

## 2015-10-09 DIAGNOSIS — M545 Low back pain: Secondary | ICD-10-CM | POA: Diagnosis not present

## 2016-01-08 DIAGNOSIS — M545 Low back pain: Secondary | ICD-10-CM | POA: Diagnosis not present

## 2016-01-08 DIAGNOSIS — M47817 Spondylosis without myelopathy or radiculopathy, lumbosacral region: Secondary | ICD-10-CM | POA: Diagnosis not present

## 2016-01-08 DIAGNOSIS — M5136 Other intervertebral disc degeneration, lumbar region: Secondary | ICD-10-CM | POA: Diagnosis not present

## 2016-02-04 DIAGNOSIS — M25551 Pain in right hip: Secondary | ICD-10-CM | POA: Diagnosis not present

## 2016-02-04 DIAGNOSIS — M898X8 Other specified disorders of bone, other site: Secondary | ICD-10-CM | POA: Insufficient documentation

## 2016-02-18 DIAGNOSIS — M47817 Spondylosis without myelopathy or radiculopathy, lumbosacral region: Secondary | ICD-10-CM | POA: Diagnosis not present

## 2016-02-18 DIAGNOSIS — M545 Low back pain: Secondary | ICD-10-CM | POA: Diagnosis not present

## 2016-03-28 ENCOUNTER — Encounter: Payer: Self-pay | Admitting: Vascular Surgery

## 2016-04-02 ENCOUNTER — Other Ambulatory Visit: Payer: Self-pay | Admitting: *Deleted

## 2016-04-02 DIAGNOSIS — I83892 Varicose veins of left lower extremities with other complications: Secondary | ICD-10-CM

## 2016-04-08 ENCOUNTER — Ambulatory Visit (INDEPENDENT_AMBULATORY_CARE_PROVIDER_SITE_OTHER): Payer: Medicare Other | Admitting: Vascular Surgery

## 2016-04-08 ENCOUNTER — Ambulatory Visit (HOSPITAL_COMMUNITY)
Admission: RE | Admit: 2016-04-08 | Discharge: 2016-04-08 | Disposition: A | Discharge status: Home / Self Care | Payer: Medicare Other | Source: Ambulatory Visit | Attending: Vascular Surgery | Admitting: Vascular Surgery

## 2016-04-08 ENCOUNTER — Encounter: Payer: Self-pay | Admitting: Vascular Surgery

## 2016-04-08 VITALS — BP 125/77 | HR 93 | Temp 98.0°F | Resp 14 | Ht 66.0 in | Wt 123.0 lb

## 2016-04-08 DIAGNOSIS — I83892 Varicose veins of left lower extremities with other complications: Secondary | ICD-10-CM | POA: Diagnosis not present

## 2016-04-08 DIAGNOSIS — I83893 Varicose veins of bilateral lower extremities with other complications: Secondary | ICD-10-CM | POA: Diagnosis not present

## 2016-04-08 NOTE — Progress Notes (Signed)
Subjective:     Patient ID: [name], female   DOB: 21-Jul-1946, 70 y.o.   MRN: [id number]  HPI this 70 year old female is evaluated for painful varicosities in both lower extremities. She has worsening bulges in both legs left worse than right which causes aching throbbing and burning discomfort throughout the day. She develops swelling in both ankles as the day progresses. She has developed extensive network of small blue veins onto both feet left worse than right. She has no history of stasis ulcers leading deep vein thrombosis or superficial thrombophlebitis. She does not elastic compression stockings. Her symptoms continued to worsen and are affecting her daily living.  Past Medical History  Diagnosis Date  . Chicken pox   . Mitral valve prolapse   . Contact lens/glasses fitting     wears contacts or glasses  . IBS (irritable bowel syndrome)     diet contolled  . Seasonal allergies   . Arthritis   . DDD (degenerative disc disease), lumbar   . AAA (abdominal aortic aneurysm) ([place])     saw Dr Kellie Simmering 11/2013.  2.7 cm  . History of blood transfusion   . GERD (gastroesophageal reflux disease)   . Hyperlipidemia   . RBBB   . Varicose veins     Social History  Substance Use Topics  . Smoking status: Current Every Day Smoker -- 0.50 packs/day for 30 years    Types: Cigarettes  . Smokeless tobacco: Never Used  . Alcohol Use: 0.0 oz/week    0 Standard drinks or equivalent per week     Comment: very occasional, not monthly    Family History  Problem Relation Age of Onset  . Early death Mother     aortic aneurysm  . Vasculitis Mother     temporal arteritis  . Aortic aneurysm Mother   . Heart disease Mother     before age 22  . Hyperlipidemia Mother   . Hypertension Mother   . COPD Sister   . Hypertension Sister   . Mental illness Sister   . Depression Sister   . Heart disease Sister     before age 65  . Cancer Maternal Aunt 84    brain  . Heart disease Maternal  Grandmother     aortic aneurysm  . Aortic aneurysm Maternal Grandmother   . Hypertension Sister     Allergies  Allergen Reactions  . Bee Venom     Other reaction(s): Redness  . Gabapentin Diarrhea     Current outpatient prescriptions:  .  cetirizine (ZYRTEC) 10 MG tablet, Take 10 mg by mouth daily., Disp: , Rfl:  .  HYDROcodone-acetaminophen (NORCO) 10-325 MG per tablet, Take 1 tablet by mouth 2 (two) times daily as needed for moderate pain., Disp: 60 tablet, Rfl: 0 .  cyanocobalamin (,VITAMIN B-12,) 1000 MCG/ML injection, Inject 1 mL (1,000 mcg total) into the muscle every 7 (seven) days. (Patient not taking: Reported on 12/05/2014), Disp: 1 mL, Rfl: 3 .  diazepam (VALIUM) 5 MG tablet, Take 1 tablet (5 mg total) by mouth every 6 (six) hours as needed for muscle spasms. (Patient not taking: Reported on 12/05/2014), Disp: 60 tablet, Rfl: 0 .  neomycin-polymyxin-hydrocortisone (CORTISPORIN) otic solution, Place 4 drops into both ears 4 (four) times daily. (Patient not taking: Reported on 12/05/2014), Disp: 10 mL, Rfl: 0  Filed Vitals:   04/08/16 1106  BP: 125/77  Pulse: 93  Temp: 98 F (36.7 C)  Resp: 14  Height: 5\' 6"  (1.676  m)  Weight: 123 lb (55.792 kg)  SpO2: 99%    Body mass index is 19.86 kg/(m^2).         Review of Systems denies chest pain, dyspnea on exertion, PND, orthopnea, hemoptysis, claudication. Has history of small abdominal aortic aneurysm, degenerative joint disease, hyperlipidemia. Other systems negative and complete review of systems     Objective:   Physical Exam BP 125/77 mmHg  Pulse 93  Temp(Src) 98 F (36.7 C)  Resp 14  Ht 5\' 6"  (1.676 m)  Wt 123 lb (55.792 kg)  BMI 19.86 kg/m2  SpO2 99%    Gen.-alert and oriented x3 in no apparent distress HEENT normal for age Lungs no rhonchi or wheezing Cardiovascular regular rhythm no murmurs carotid pulses 3+ palpable no bruits audible Abdomen soft nontender no palpable masses Musculoskeletal free of   major deformities Skin clear -no rashes Neurologic normal Lower extremities 3+ femoral and dorsalis pedis pulses palpable bilaterally with 1+ edema bilaterally Extensive bulging varicosities bilaterally beginning in distal thigh extending into medial calf and posterior calf left worse than right. Extensive reticular veins extending onto dorsum of foot bilaterally left worse than right. No active ulcer noted. No hyperpigmentation noted.  Today I ordered bilateral venous duplex exam which I reviewed and interpreted. There is no DVT. There is gross reflux throughout large great saphenous veins bilaterally supplying these painful varicosities with no deep vein reflux or obstruction       Assessment:     Bilateral painful varicosities with gross reflux bilateral great saphenous veins causing symptoms which are affecting patient's daily living. Small abdominal aortic aneurysm-2.7 cm Degenerative joint disease Hyperlipidemia     Plan:         #1 long leg elastic compression stockings 20-30 mm gradient #2 elevate legs as much as possible #3 ibuprofen daily on a regular basis for pain #4 return in 3 months-if no significant improvement then she will need #1 laser ablation left great saphenous vein followed by #2 laser ablation right great saphenous vein. She will then return in 3 months to evaluate  for possible stab phlebectomy and sclerotherapy Return in 3 months

## 2016-07-10 ENCOUNTER — Encounter: Payer: Self-pay | Admitting: Vascular Surgery

## 2016-07-15 ENCOUNTER — Other Ambulatory Visit: Payer: Self-pay | Admitting: *Deleted

## 2016-07-15 ENCOUNTER — Ambulatory Visit (INDEPENDENT_AMBULATORY_CARE_PROVIDER_SITE_OTHER): Payer: Medicare Other | Admitting: Vascular Surgery

## 2016-07-15 VITALS — BP 126/82 | HR 96 | Temp 97.6°F | Resp 18 | Ht 66.5 in | Wt 126.0 lb

## 2016-07-15 DIAGNOSIS — I83893 Varicose veins of bilateral lower extremities with other complications: Secondary | ICD-10-CM

## 2016-07-15 NOTE — Progress Notes (Signed)
Subjective:     Patient ID: [name], female   DOB: 07/12/1946, 70 y.o.   MRN: [id number]  HPI This 70 year old female returns after 3 months for continued follow-up regarding her  painful varicosities of both lower extremities. She has tried long leg elastic compression stockings 20-30 millimeter gradient as well as elevation and ibuprofen but continues to have aching throbbing and burning discomfort in both legs as the day progresses. Left is worse than right. She has no history of stasis ulcers bleeding or DVT. Her symptoms are affecting her daily living and she would like treatment.  Past Medical History:  Diagnosis Date  . AAA (abdominal aortic aneurysm) ([place])    saw Dr Kellie Simmering 11/2013.  2.7 cm  . Arthritis   . Chicken pox   . Contact lens/glasses fitting    wears contacts or glasses  . DDD (degenerative disc disease), lumbar   . GERD (gastroesophageal reflux disease)   . History of blood transfusion   . Hyperlipidemia   . IBS (irritable bowel syndrome)    diet contolled  . Mitral valve prolapse   . RBBB   . Seasonal allergies   . Varicose veins     Social History  Substance Use Topics  . Smoking status: Current Every Day Smoker    Packs/day: 0.50    Years: 30.00    Types: Cigarettes  . Smokeless tobacco: Never Used  . Alcohol use 0.0 oz/week     Comment: very occasional, not monthly    Family History  Problem Relation Age of Onset  . Early death Mother     aortic aneurysm  . Vasculitis Mother     temporal arteritis  . Aortic aneurysm Mother   . Heart disease Mother     before age 23  . Hyperlipidemia Mother   . Hypertension Mother   . COPD Sister   . Hypertension Sister   . Mental illness Sister   . Depression Sister   . Heart disease Sister     before age 46  . Cancer Maternal Aunt 84    brain  . Heart disease Maternal Grandmother     aortic aneurysm  . Aortic aneurysm Maternal Grandmother   . Hypertension Sister     Allergies  Allergen  Reactions  . Bee Venom     Other reaction(s): Redness  . Gabapentin Diarrhea     Current Outpatient Prescriptions:  .  cetirizine (ZYRTEC) 10 MG tablet, Take 10 mg by mouth daily., Disp: , Rfl:  .  HYDROcodone-acetaminophen (NORCO) 10-325 MG per tablet, Take 1 tablet by mouth 2 (two) times daily as needed for moderate pain., Disp: 60 tablet, Rfl: 0  Vitals:   07/15/16 1342  BP: 126/82  Pulse: 96  Resp: 18  Temp: 97.6 F (36.4 C)  TempSrc: Oral  SpO2: 97%  Weight: 126 lb (57.2 kg)  Height: 5' 6.5" (1.689 m)    Body mass index is 20.03 kg/m.         Review of Systems   denies chest pain, dyspnea on exertion, PND, orthopnea, hemoptysis, claudication. Objective:   Physical Exam BP 126/82 Comment: Left arm sitting  Pulse 96   Temp 97.6 F (36.4 C) (Oral)   Resp 18   Ht 5' 6.5" (1.689 m)   Wt 126 lb (57.2 kg)   SpO2 97%   BMI 20.03 kg/m   Gen. well-developed well-nourished female no apparent distress alert and oriented 3 Lungs no rhonchi or wheezing Both legs with  bulging varicosities below the knee in the medial calf over the great saphenous system with early hyperpigmentation lower third of the leg. No active ulcer noted. 3+ dorsalis pedis pulse palpable bilaterally.  Previous formal venous duplex exam performed in 04/08/2016 reveals large-caliber bilateral great saphenous veins with gross reflux supplying painful varicosities    Assessment:     Bilateral painful varicosities in the lower extremities due to gross reflux bilateral great saphenous veins which are causing symptoms which are resistant to conservative measures including long-leg elastic compression stockings 20-30 millimeter gradient, elevation, and ibuprofen. Patient's symptoms are affecting her daily living.    Plan:     Patient needs #1 laser ablation left great saphenous vein followed by #2 laser ablation right great saphenous vein followed by three-month waiting.. Patient will then return to  see if stab phlebectomy of painful varicosities will be indicated Exline we will proceed with precertification to perform this in the near future

## 2016-07-16 ENCOUNTER — Other Ambulatory Visit: Payer: Self-pay | Admitting: *Deleted

## 2016-07-16 DIAGNOSIS — I83893 Varicose veins of bilateral lower extremities with other complications: Secondary | ICD-10-CM

## 2016-07-24 ENCOUNTER — Encounter: Payer: Self-pay | Admitting: Vascular Surgery

## 2016-07-28 ENCOUNTER — Ambulatory Visit (INDEPENDENT_AMBULATORY_CARE_PROVIDER_SITE_OTHER): Payer: Medicare Other | Admitting: Vascular Surgery

## 2016-07-28 ENCOUNTER — Encounter: Payer: Self-pay | Admitting: Vascular Surgery

## 2016-07-28 VITALS — BP 139/83 | HR 84 | Temp 98.1°F | Resp 14 | Ht 66.0 in | Wt 125.0 lb

## 2016-07-28 DIAGNOSIS — I83892 Varicose veins of left lower extremities with other complications: Secondary | ICD-10-CM

## 2016-07-28 NOTE — Progress Notes (Signed)
Subjective:     Patient ID: [name], female   DOB: 1945-12-21, 71 y.o.   MRN: [id number]  HPI This 70 year old female had laser ablation left great saphenous vein from the proximal calf to near the saphenofemoral junction performed under local tumescent anesthesia. A total of 2137 J of energy was utilized. She tolerated the procedure well.  Review of Systems     Objective:   Physical Exam BP 139/83 (BP Location: Left Arm, Patient Position: Sitting, Cuff Size: Normal)   Pulse 84   Temp 98.1 F (36.7 C)   Resp 14   Ht 5\' 6"  (1.676 m)   Wt 125 lb (56.7 kg)   SpO2 98%   BMI 20.18 kg/m        Assessment:     Well-tolerated laser ablation left great saphenous vein performed under local tumescent anesthesia for gross reflux    Plan:     Return in 1 week for venous duplex exam confirmed closure left great saphenous vein

## 2016-07-28 NOTE — Progress Notes (Signed)
Laser Ablation Procedure    Date: 07/28/2016   [name] DOB:July 16, 1946  Consent signed: Yes    Surgeon:  Dr. Nelda Severe. Kellie Simmering  Procedure: Laser Ablation: left Greater Saphenous Vein  BP 139/83 (BP Location: Left Arm, Patient Position: Sitting, Cuff Size: Normal)   Pulse 84   Temp 98.1 F (36.7 C)   Resp 14   Ht 5\' 6"  (1.676 m)   Wt 125 lb (56.7 kg)   SpO2 98%   BMI 20.18 kg/m   Tumescent Anesthesia: 410 cc 0.9% NaCl with 50 cc Lidocaine HCL with 1% Epi and 15 cc 8.4% NaHCO3  Local Anesthesia: 6 cc Lidocaine HCL and NaHCO3 (ratio 2:1)  Pulsed Mode: 15 watts, 588ms delay, 1.0 duration  Total Energy:2137              Total Pulses143:                Total Time: 2:23    Patient tolerated procedure well  Notes:   Description of Procedure:  After marking the course of the secondary varicosities, the patient was placed on the operating table in the supine position, and the left leg was prepped and draped in sterile fashion.   Local anesthetic was administered and under ultrasound guidance the saphenous vein was accessed with a micro needle and guide wire; then the mirco puncture sheath was placed.  A guide wire was inserted saphenofemoral junction , followed by a 5 french sheath.  The position of the sheath and then the laser fiber below the junction was confirmed using the ultrasound.  Tumescent anesthesia was administered along the course of the saphenous vein using ultrasound guidance. The patient was placed in Trendelenburg position and protective laser glasses were placed on patient and staff, and the laser was fired at 15 watts continuous mode advancing 1-82mm/second for a total of 2137 joules.     Steri strips were applied to the stab wounds and ABD pads and thigh high compression stockings were applied.  Ace wrap bandages were applied over the phlebectomy sites and at the top of the saphenofemoral junction. Blood loss was less than 15 cc.  The patient ambulated out of the  operating room having tolerated the procedure well.

## 2016-07-29 ENCOUNTER — Telehealth: Payer: Self-pay | Admitting: *Deleted

## 2016-07-29 NOTE — Telephone Encounter (Signed)
Pt doing well. Following all instructions.

## 2016-08-01 ENCOUNTER — Encounter: Payer: Self-pay | Admitting: Vascular Surgery

## 2016-08-05 ENCOUNTER — Encounter (HOSPITAL_COMMUNITY): Payer: Medicare Other

## 2016-08-05 ENCOUNTER — Encounter: Payer: Self-pay | Admitting: Vascular Surgery

## 2016-08-05 ENCOUNTER — Ambulatory Visit (HOSPITAL_COMMUNITY)
Admission: RE | Admit: 2016-08-05 | Discharge: 2016-08-05 | Disposition: A | Discharge status: Home / Self Care | Payer: Medicare Other | Source: Ambulatory Visit | Attending: Vascular Surgery | Admitting: Vascular Surgery

## 2016-08-05 ENCOUNTER — Ambulatory Visit (INDEPENDENT_AMBULATORY_CARE_PROVIDER_SITE_OTHER): Payer: Medicare Other | Admitting: Vascular Surgery

## 2016-08-05 VITALS — BP 118/82 | HR 101 | Temp 99.5°F | Resp 24 | Ht 66.0 in | Wt 127.1 lb

## 2016-08-05 DIAGNOSIS — I83893 Varicose veins of bilateral lower extremities with other complications: Secondary | ICD-10-CM | POA: Diagnosis present

## 2016-08-05 DIAGNOSIS — Z9889 Other specified postprocedural states: Secondary | ICD-10-CM | POA: Diagnosis not present

## 2016-08-05 NOTE — Progress Notes (Signed)
Subjective:     Patient ID: [name], female   DOB: 01/29/1946, 70 y.o.   MRN: [id number]  HPI This 70 year old female returns 1 week post-laser ablation left great saphenous vein for painful varicosities and swelling. She states leg feels much better. She has had no edema in the ankle since the procedure. She has noticed moderate discomfort along the course of the saphenous vein which is improving. She took ibuprofen and where her elastic compression stockings as instructed.  Past Medical History:  Diagnosis Date  . AAA (abdominal aortic aneurysm) ([place])    saw Dr Kellie Simmering 11/2013.  2.7 cm  . Arthritis   . Chicken pox   . Contact lens/glasses fitting    wears contacts or glasses  . DDD (degenerative disc disease), lumbar   . GERD (gastroesophageal reflux disease)   . History of blood transfusion   . Hyperlipidemia   . IBS (irritable bowel syndrome)    diet contolled  . Mitral valve prolapse   . RBBB   . Seasonal allergies   . Varicose veins     Social History  Substance Use Topics  . Smoking status: Current Every Day Smoker    Packs/day: 0.50    Years: 30.00    Types: Cigarettes  . Smokeless tobacco: Never Used  . Alcohol use 0.0 oz/week     Comment: very occasional, not monthly    Family History  Problem Relation Age of Onset  . Early death Mother     aortic aneurysm  . Vasculitis Mother     temporal arteritis  . Aortic aneurysm Mother   . Heart disease Mother     before age 33  . Hyperlipidemia Mother   . Hypertension Mother   . COPD Sister   . Hypertension Sister   . Mental illness Sister   . Depression Sister   . Heart disease Sister     before age 21  . Cancer Maternal Aunt 84    brain  . Heart disease Maternal Grandmother     aortic aneurysm  . Aortic aneurysm Maternal Grandmother   . Hypertension Sister     Allergies  Allergen Reactions  . Bee Venom     Other reaction(s): Redness  . Gabapentin Diarrhea     Current Outpatient  Prescriptions:  .  cetirizine (ZYRTEC) 10 MG tablet, Take 10 mg by mouth daily., Disp: , Rfl:  .  HYDROcodone-acetaminophen (NORCO) 10-325 MG per tablet, Take 1 tablet by mouth 2 (two) times daily as needed for moderate pain., Disp: 60 tablet, Rfl: 0  Vitals:   08/05/16 1121  BP: 118/82  Pulse: (!) 101  Resp: (!) 24  Temp: 99.5 F (37.5 C)  TempSrc: Oral  SpO2: 96%  Weight: 127 lb 1.6 oz (57.7 kg)  Height: 5\' 6"  (1.676 m)    Body mass index is 20.51 kg/m.         Review of Systems Denies chest pain, dyspnea on exertion, PND, orthopnea, hemoptysis    Objective:   Physical Exam BP 118/82 (BP Location: Left Arm, Patient Position: Sitting, Cuff Size: Normal)   Pulse (!) 101   Temp 99.5 F (37.5 C) (Oral)   Resp (!) 24   Ht 5\' 6"  (1.676 m)   Wt 127 lb 1.6 oz (57.7 kg)   SpO2 96%   BMI 20.51 kg/m   Gen. well-developed well-nourished female no apparent distress alert and oriented 3 Lungs no rhonchi or wheezing Left leg with mild ecchymosis medial thigh  and mild tenderness to deep palpation over great saphenous vein. No distal edema noted. 2+ dorsalis pedis pulse palpable.  Today I ordered a venous duplex exam the left leg which I reviewed and interpreted. There is no DVT. There is total closure left great saphenous vein up to near the saphenofemoral junction.     Assessment:     Successful laser ablation left great saphenous vein for painful varicosities with gross reflux    Plan:     Patient to return next week for similar procedure on contralateral right leg

## 2016-08-06 ENCOUNTER — Encounter: Payer: Self-pay | Admitting: Vascular Surgery

## 2016-08-08 ENCOUNTER — Encounter: Payer: Self-pay | Admitting: Vascular Surgery

## 2016-08-12 ENCOUNTER — Encounter: Payer: Self-pay | Admitting: Vascular Surgery

## 2016-08-12 ENCOUNTER — Ambulatory Visit (INDEPENDENT_AMBULATORY_CARE_PROVIDER_SITE_OTHER): Payer: Medicare Other | Admitting: Vascular Surgery

## 2016-08-12 VITALS — BP 155/79 | HR 98 | Temp 98.0°F | Resp 16 | Ht 64.0 in | Wt 126.0 lb

## 2016-08-12 DIAGNOSIS — I83891 Varicose veins of right lower extremities with other complications: Secondary | ICD-10-CM | POA: Diagnosis not present

## 2016-08-12 NOTE — Progress Notes (Signed)
Subjective:     Patient ID: [name], female   DOB: [birth year], 70 y.o.   MRN: [id number]  HPI This 70 year old female had laser ablation of the right great saphenous vein from the distal thigh to near the saphenofemoral junction performed under local tumescent anesthesia area a total of 1676 J of energy was utilized. She tolerated the procedure well.  Review of Systems     Objective:   Physical Exam BP (!) 155/79   Pulse 98   Temp 98 F (36.7 C)   Resp 16   Ht 5\' 4"  (1.626 m)   Wt 126 lb (57.2 kg)   SpO2 100%   BMI 21.63 kg/m        Assessment:     Well-tolerated laser ablation right great saphenous vein performed under local tumescent anesthesia    Plan:     Return in 1 week for venous duplex exam to confirm closure right great saphenous vein Patient will then return in 3 months to evaluate for possible stab phlebectomy of painful varicosities

## 2016-08-12 NOTE — Progress Notes (Signed)
Laser Ablation Procedure    Date: 08/12/2016   [name] DOB:09-03-1946  Consent signed: Yes    Surgeon:  Dr. Nelda Severe. Kellie Simmering  Procedure: Laser Ablation: right Greater Saphenous Vein  BP (!) 155/79   Pulse 98   Temp 98 F (36.7 C)   Resp 16   Ht 5\' 4"  (1.626 m)   Wt 126 lb (57.2 kg)   SpO2 100%   BMI 21.63 kg/m   Tumescent Anesthesia: 300 cc 0.9% NaCl with 50 cc Lidocaine HCL with 1% Epi and 15 cc 8.4% NaHCO3  Local Anesthesia: 3 cc Lidocaine HCL and NaHCO3 (ratio 2:1)  Pulsed Mode: 15 watts, 583ms delay, 1.0 duration  Total Energy: 1675             Total Pulses:  112              Total Time: 1:51    Patient tolerated procedure well  Notes:   Description of Procedure:  After marking the course of the secondary varicosities, the patient was placed on the operating table in the supine position, and the right leg was prepped and draped in sterile fashion.   Local anesthetic was administered and under ultrasound guidance the saphenous vein was accessed with a micro needle and guide wire; then the mirco puncture sheath was placed.  A guide wire was inserted saphenofemoral junction , followed by a 5 french sheath.  The position of the sheath and then the laser fiber below the junction was confirmed using the ultrasound.  Tumescent anesthesia was administered along the course of the saphenous vein using ultrasound guidance. The patient was placed in Trendelenburg position and protective laser glasses were placed on patient and staff, and the laser was fired at 15 watts continuous mode advancing 1-94mm/second for a total of 1675 joules.     Steri strips were applied to the stab wounds and ABD pads and thigh high compression stockings were applied.  Ace wrap bandages were applied over the phlebectomy sites and at the top of the saphenofemoral junction. Blood loss was less than 15 cc.  The patient ambulated out of the operating room having tolerated the procedure well.

## 2016-08-13 ENCOUNTER — Encounter: Payer: Self-pay | Admitting: Vascular Surgery

## 2016-08-19 ENCOUNTER — Inpatient Hospital Stay (HOSPITAL_COMMUNITY): Admission: RE | Admit: 2016-08-19 | Payer: Medicare Other | Source: Ambulatory Visit

## 2016-08-19 ENCOUNTER — Ambulatory Visit: Payer: Medicare Other | Admitting: Vascular Surgery

## 2016-08-20 ENCOUNTER — Encounter: Payer: Self-pay | Admitting: Vascular Surgery

## 2016-08-25 ENCOUNTER — Ambulatory Visit (INDEPENDENT_AMBULATORY_CARE_PROVIDER_SITE_OTHER): Payer: Medicare Other | Admitting: Vascular Surgery

## 2016-08-25 ENCOUNTER — Ambulatory Visit (HOSPITAL_COMMUNITY)
Admission: RE | Admit: 2016-08-25 | Discharge: 2016-08-25 | Disposition: A | Discharge status: Home / Self Care | Payer: Medicare Other | Source: Ambulatory Visit | Attending: Vascular Surgery | Admitting: Vascular Surgery

## 2016-08-25 VITALS — BP 125/81 | HR 93 | Temp 97.6°F | Resp 14 | Ht 66.0 in | Wt 126.0 lb

## 2016-08-25 DIAGNOSIS — I83893 Varicose veins of bilateral lower extremities with other complications: Secondary | ICD-10-CM | POA: Insufficient documentation

## 2016-08-25 DIAGNOSIS — I714 Abdominal aortic aneurysm, without rupture, unspecified: Secondary | ICD-10-CM | POA: Insufficient documentation

## 2016-08-25 DIAGNOSIS — I83891 Varicose veins of right lower extremities with other complications: Secondary | ICD-10-CM

## 2016-08-25 DIAGNOSIS — Z9889 Other specified postprocedural states: Secondary | ICD-10-CM | POA: Insufficient documentation

## 2016-08-25 NOTE — Progress Notes (Signed)
Subjective:     Patient ID: [name], female   DOB: 05/07/1946, 70 y.o.   MRN: [id number]  HPI This 70 year old female returns 2 weeks post-laser ablation right great saphenous vein for painful varicosities due to gross reflux. She has had some mild discomfort in the medial thigh and has worn her elastic compression stockings 20-30 millimeter gradient and taken ibuprofen as instructed. She has had no distal edema. Her left leg also is feeling well. She does have a history of a small abdominal aortic aneurysm and had a family history of abdominal aortic aneurysm in her mother and is concerned about this. It was checked 2 years ago was noted to be 2.9 cm in maximum diameter.  Past Medical History:  Diagnosis Date  . AAA (abdominal aortic aneurysm) ([place])    saw Dr Kellie Simmering 11/2013.  2.7 cm  . Arthritis   . Chicken pox   . Contact lens/glasses fitting    wears contacts or glasses  . DDD (degenerative disc disease), lumbar   . GERD (gastroesophageal reflux disease)   . History of blood transfusion   . Hyperlipidemia   . IBS (irritable bowel syndrome)    diet contolled  . Mitral valve prolapse   . RBBB   . Seasonal allergies   . Varicose veins     Social History  Substance Use Topics  . Smoking status: Current Every Day Smoker    Packs/day: 0.50    Years: 30.00    Types: Cigarettes  . Smokeless tobacco: Never Used  . Alcohol use 0.0 oz/week     Comment: very occasional, not monthly    Family History  Problem Relation Age of Onset  . Early death Mother     aortic aneurysm  . Vasculitis Mother     temporal arteritis  . Aortic aneurysm Mother   . Heart disease Mother     before age 25  . Hyperlipidemia Mother   . Hypertension Mother   . COPD Sister   . Hypertension Sister   . Mental illness Sister   . Depression Sister   . Heart disease Sister     before age 67  . Cancer Maternal Aunt 84    brain  . Heart disease Maternal Grandmother     aortic aneurysm  . Aortic  aneurysm Maternal Grandmother   . Hypertension Sister     Allergies  Allergen Reactions  . Bee Venom     Other reaction(s): Redness  . Gabapentin Diarrhea     Current Outpatient Prescriptions:  .  cetirizine (ZYRTEC) 10 MG tablet, Take 10 mg by mouth daily., Disp: , Rfl:  .  HYDROcodone-acetaminophen (NORCO) 10-325 MG per tablet, Take 1 tablet by mouth 2 (two) times daily as needed for moderate pain. (Patient not taking: Reported on 08/25/2016), Disp: 60 tablet, Rfl: 0  Vitals:   08/25/16 1011  BP: 125/81  Pulse: 93  Resp: 14  Temp: 97.6 F (36.4 C)  SpO2: 98%  Weight: 126 lb (57.2 kg)  Height: 5\' 6"  (1.676 m)    Body mass index is 20.34 kg/m.         Review of Systems Denies chest pain, dyspnea on exertion, PND, orthopnea, hemoptysis, abdominal pain    Objective:   Physical Exam BP 125/81 (BP Location: Left Arm, Patient Position: Sitting, Cuff Size: Normal)   Pulse 93   Temp 97.6 F (36.4 C)   Resp 14   Ht 5\' 6"  (1.676 m)   Wt 126 lb (  57.2 kg)   SpO2 98%   BMI 20.34 kg/m   Gen. well-developed well-nourished thin female in no apparent distress alert and oriented 3 Abdomen soft nontender no palpable mass Lungs no rhonchi or wheezing Right leg with mild discomfort to deep palpation of great saphenous vein. Reticular veins and ankle area extending onto medial malleolar region. 3+ dorsalis pedis pulse palpable. No ulceration noted. Bulging varicosities and medial calf.  Today I ordered a venous duplex exam which I reviewed and interpreted. There is no DVT. The right leg has total closure of the great saphenous vein from the distal thigh to near the saphenofemoral junction     Assessment:     #1 successful closure right great saphenous vein for gross reflux with painful varicosities #2 small nominal aortic aneurysm last imaged in 2016    Plan:     Return in 3 months for evaluation to determine if stab phlebectomy of painful varicosities and/or  sclerotherapy will be indicated bilaterally We'll also obtain duplex scan of abdominal aortic aneurysm for follow-up

## 2016-09-29 HISTORY — PX: SHOULDER ARTHROSCOPY WITH OPEN ROTATOR CUFF REPAIR: SHX6092

## 2016-09-29 HISTORY — PX: CATARACT EXTRACTION W/ INTRAOCULAR LENS IMPLANT: SHX1309

## 2016-11-18 ENCOUNTER — Other Ambulatory Visit: Payer: Self-pay | Admitting: Family Medicine

## 2016-11-18 DIAGNOSIS — Z1231 Encounter for screening mammogram for malignant neoplasm of breast: Secondary | ICD-10-CM

## 2016-11-19 ENCOUNTER — Encounter: Payer: Self-pay | Admitting: Family

## 2016-11-25 ENCOUNTER — Ambulatory Visit: Payer: Medicare Other | Admitting: Family

## 2016-11-25 ENCOUNTER — Other Ambulatory Visit (HOSPITAL_COMMUNITY): Payer: Medicare Other

## 2016-12-03 ENCOUNTER — Ambulatory Visit: Payer: Medicare Other

## 2016-12-09 ENCOUNTER — Other Ambulatory Visit (HOSPITAL_COMMUNITY): Payer: Medicare Other

## 2016-12-09 ENCOUNTER — Ambulatory Visit: Payer: Medicare Other | Admitting: Family

## 2016-12-24 ENCOUNTER — Ambulatory Visit
Admission: RE | Admit: 2016-12-24 | Discharge: 2016-12-24 | Disposition: A | Discharge status: Home / Self Care | Payer: Medicare Other | Source: Ambulatory Visit | Attending: Family Medicine | Admitting: Family Medicine

## 2016-12-24 DIAGNOSIS — Z1231 Encounter for screening mammogram for malignant neoplasm of breast: Secondary | ICD-10-CM

## 2017-01-01 ENCOUNTER — Encounter: Payer: Self-pay | Admitting: Vascular Surgery

## 2017-01-13 ENCOUNTER — Ambulatory Visit (INDEPENDENT_AMBULATORY_CARE_PROVIDER_SITE_OTHER): Payer: Medicare Other | Admitting: Family

## 2017-01-13 ENCOUNTER — Encounter: Payer: Self-pay | Admitting: Family

## 2017-01-13 ENCOUNTER — Ambulatory Visit (HOSPITAL_COMMUNITY)
Admission: RE | Admit: 2017-01-13 | Discharge: 2017-01-13 | Disposition: A | Discharge status: Home / Self Care | Payer: Medicare Other | Source: Ambulatory Visit | Attending: Vascular Surgery | Admitting: Vascular Surgery

## 2017-01-13 VITALS — BP 126/80 | HR 84 | Temp 97.6°F | Resp 16 | Ht 66.0 in | Wt 122.0 lb

## 2017-01-13 DIAGNOSIS — I728 Aneurysm of other specified arteries: Secondary | ICD-10-CM | POA: Diagnosis not present

## 2017-01-13 DIAGNOSIS — I714 Abdominal aortic aneurysm, without rupture, unspecified: Secondary | ICD-10-CM

## 2017-01-13 DIAGNOSIS — F17200 Nicotine dependence, unspecified, uncomplicated: Secondary | ICD-10-CM | POA: Diagnosis not present

## 2017-01-13 NOTE — Progress Notes (Signed)
[facility]   CC: Follow up Abdominal Aortic Aneurysm  History of Present Illness  [name] is a 71 y.o. ([birth year]) female whom Dr. Kellie Simmering has been monitoring for a small aortic aneurysm which was discovered on CT scanning in 2015. At that time it was 2.7 cm in maximum diameter.  She returns today for 2 year follow up of her small AAA.  She does have a family history of rupture of an aortic aneurysm in her mother and grandmother and is quite concerned about this. She has not had any abdominal or back symptoms. She denies any claudication type symptoms.  Dr. Kellie Simmering has also been addressing her varicose veins with laser ablation right great saphenous vein for painful varicosities due to gross reflux and stab phlebectomy.   The patient denies claudication in legs with walking. The patient denies history of stroke or TIA symptoms.  She states she controls her IBS with a healthy diet.   Pt denies dyspnea or cough.   Pt Diabetic: No Pt smoker: smoker  (1/2 ppd, started in her early 20's)   Past Medical History:  Diagnosis Date  . AAA (abdominal aortic aneurysm) ([place])    saw Dr Kellie Simmering 11/2013.  2.7 cm  . Arthritis   . Chicken pox   . Contact lens/glasses fitting    wears contacts or glasses  . DDD (degenerative disc disease), lumbar   . GERD (gastroesophageal reflux disease)   . History of blood transfusion   . Hyperlipidemia   . IBS (irritable bowel syndrome)    diet contolled  . Mitral valve prolapse   . RBBB   . Seasonal allergies   . Varicose veins    Past Surgical History:  Procedure Laterality Date  . APPENDECTOMY    . BACK SURGERY N/A [phone]   3 back surgeries,  . BREAST BIOPSY  1989,05   lt br bx  . BREAST EXCISIONAL BIOPSY Bilateral   . CHOLECYSTECTOMY    . EXCISION/RELEASE BURSA HIP Right 05/12/2013   Procedure: EXCISION RIGHT TROCHANTERIC BURSA/CALCIFICATION ;  Surgeon: Ninetta Lights, MD;  Location: [facility];  Service: Orthopedics;  Laterality: Right;  . FASCIOTOMY Right 05/12/2013   Procedure: RIGHT FASCIOTOMY HIP ANY TYPE;  Surgeon: Ninetta Lights, MD;  Location: [facility];  Service: Orthopedics;  Laterality: Right;  . JOINT REPLACEMENT    . NM MYOCAR PERF WALL MOTION  04/13/2003   negative  . TONSILLECTOMY    . TONSILLECTOMY AND ADENOIDECTOMY  1950  . TOTAL HIP ARTHROPLASTY Left 2006   lt total hip  . TOTAL HIP ARTHROPLASTY  2011   rt total hip  . US ECHOCARDIOGRAPHY  04/13/2003   EF 65%, mild TR w/suggestion of mild pulmonary hypertension,thickened anterior leaflet,central mild MR.  Marland Kitchen VAGINAL HYSTERECTOMY  1993   Social History Social History   Social History  . Marital status: Legally Separated    Spouse name: N/A  . Number of children: N/A  . Years of education: N/A   Occupational History  . Not on file.   Social History Main Topics  . Smoking status: Current Every Day Smoker    Packs/day: 0.50    Years: 30.00    Types: Cigarettes  . Smokeless tobacco: Never Used  . Alcohol use 0.0 oz/week     Comment: very occasional, not monthly  . Drug use: No  . Sexual activity: No   Other Topics Concern  . Not on file  Social History Narrative  . No narrative on file   Family History Family History  Problem Relation Age of Onset  . Early death Mother     aortic aneurysm  . Vasculitis Mother     temporal arteritis  . Aortic aneurysm Mother   . Heart disease Mother     before age 70  . Hyperlipidemia Mother   . Hypertension Mother   . COPD Sister   . Hypertension Sister   . Mental illness Sister   . Depression Sister   . Heart disease Sister     before age 20  . Cancer Maternal Aunt 84    brain  . Heart disease Maternal Grandmother     aortic aneurysm  . Aortic aneurysm Maternal Grandmother   . Hypertension Sister   . Breast cancer Neg Hx     Current Outpatient Prescriptions on File Prior to Visit  Medication Sig Dispense Refill  .  cetirizine (ZYRTEC) 10 MG tablet Take 10 mg by mouth daily.     No current facility-administered medications on file prior to visit.    Allergies  Allergen Reactions  . Bee Venom     Other reaction(s): Redness  . Gabapentin Diarrhea    ROS: See HPI for pertinent positives and negatives.  Physical Examination  Vitals:   01/13/17 0949  BP: 126/80  Pulse: 84  Resp: 16  Temp: 97.6 F (36.4 C)  TempSrc: Oral  SpO2: 99%  Weight: 122 lb (55.3 kg)  Height: 5\' 6"  (1.676 m)   Body mass index is 19.69 kg/m.  General: A&O x 3, WD, thin female.  Pulmonary: Sym exp, respirations are non labored, fair air movt, + rales in left base, no rhonchi or wheezing.  Cardiac: RRR, Nl S1, S2, no detected murmur.   Carotid Bruits Right Left   Negative Negative   Aorta is not palpable Radial pulses are 2+ palpable and =                          VASCULAR EXAM:                                                                                                         LE Pulses Right Left       FEMORAL  2+ palpable  2+ palpable        POPLITEAL  1+ palpable   1+ palpable       POSTERIOR TIBIAL  2+ palpable   2+ palpable        DORSALIS PEDIS      ANTERIOR TIBIAL 2+ palpable  not palpable      Gastrointestinal: soft, NTND, -G/R, - HSM, - masses palpated, - CVAT B.  Musculoskeletal: M/S 5/5 throughout, Extremities without ischemic changes.  Neurologic: CN 2-12 intact, Pain and light touch intact in extremities are intact, Motor exam as listed above.  Non-Invasive Vascular Imaging  AAA Duplex (01/13/2017)  Previous size: 2.9 cm (Date: 12-04-16) Right CIA: 1.3 cm, Left CIA: 1.2 cm  Current size:  2.9 cm (Date: 01/13/17) Right CIA: 1.8 cm, Left CIA: 1.4 cm. 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 No significant change in the aortic diameters. Increase in diameter of the right and left  common iliac arteries. New finding of celiac artery aneurysm.   Medical Decision Making  The patient is a 71 y.o. female who presents with asymptomatic AAA with no increase in size. Increase in diameter of the right and left common iliac arteries.  I discussed with Dr. Bridgett Larsson today's duplex findings and pt HPI. The possible new finding of celiac artery aneurysm may be viewing the hepatic and splenic arteries in both of these artery diameters. If there is a celiac aneurysm, repair would be considered at 2 cm.   The patient was counseled re smoking cessation and given several free resources re smoking cessation.    Based on this patient's exam and diagnostic studies, and after discussing with Dr. Bridgett Larsson, the patient will follow up in 18 months  with the following studies: bilateral aortoiliac duplex.  Consideration for repair of AAA would be made when the size is 5.0 cm, growth > 1 cm/yr, and symptomatic status.       Consideration for repair of common iliac artery aneurysm would be made when the size is 3.0 cm.   I emphasized the importance of maximal medical management including strict control of blood pressure, blood glucose, and lipid levels, antiplatelet agents, obtaining regular exercise, and cessation of smoking.   The patient was given information about AAA including signs, symptoms, treatment, and how to minimize the risk of enlargement and rupture of aneurysms.    The patient was advised to call 911 should the patient experience sudden onset abdominal or back pain.   Thank you for allowing [place] to participate in this patient's care.  Clemon Chambers, RN, MSN, FNP-C [facility] Office: [phone]  Clinic Physician: Bridgett Larsson  01/13/2017, 1:09 PM

## 2017-01-13 NOTE — Patient Instructions (Addendum)
Abdominal Aortic Aneurysm Blood pumps away from the heart through tubes (blood vessels) called arteries. Aneurysms are weak or damaged places in the wall of an artery. It bulges out like a balloon. An abdominal aortic aneurysm happens in the main artery of the body (aorta). It can burst or tear, causing bleeding inside the body. This is an emergency. It needs treatment right away. What are the causes? The exact cause is unknown. Things that could cause this problem include:  Fat and other substances building up in the lining of a tube.  Swelling of the walls of a blood vessel.  Certain tissue diseases.  Belly (abdominal) trauma.  An infection in the main artery of the body. What increases the risk? There are things that make it more likely for you to have an aneurysm. These include:  Being over the age of 73 years old.  Having high blood pressure (hypertension).  Being a female.  Being white.  Being very overweight (obese).  Having a family history of aneurysm.  Using tobacco products. What are the signs or symptoms? Symptoms depend on the size of the aneurysm and how fast it grows. There may not be symptoms. If symptoms occur, they can include:  Pain (belly, side, lower back, or groin).  Feeling full after eating a small amount of food.  Feeling sick to your stomach (nauseous), throwing up (vomiting), or both.  Feeling a lump in your belly that feels like it is beating (pulsating).  Feeling like you will pass out (faint). How is this treated?  Medicine to control blood pressure and pain.  Imaging tests to see if the aneurysm gets bigger.  Surgery. How is this prevented? To lessen your chance of getting this condition:  Stop smoking. Stop chewing tobacco.  Limit or avoid alcohol.  Keep your blood pressure, blood sugar, and cholesterol within normal limits.  Eat less salt.  Eat foods low in saturated fats and cholesterol. These are found in animal and whole  dairy products.  Eat more fiber. Fiber is found in whole grains, vegetables, and fruits.  Keep a healthy weight.  Stay active and exercise often. This information is not intended to replace advice given to you by your health care provider. Make sure you discuss any questions you have with your health care provider. Document Released: 01/10/2013 Document Revised: 02/21/2016 Document Reviewed: 10/15/2012 Elsevier Interactive Patient Education  2017 Elsevier Inc.      Steps to Quit Smoking Smoking tobacco can be bad for your health. It can also affect almost every organ in your body. Smoking puts you and people around you at risk for many serious long-lasting (chronic) diseases. Quitting smoking is hard, but it is one of the best things that you can do for your health. It is never too late to quit. What are the benefits of quitting smoking? When you quit smoking, you lower your risk for getting serious diseases and conditions. They can include:  Lung cancer or lung disease.  Heart disease.  Stroke.  Heart attack.  Not being able to have children (infertility).  Weak bones (osteoporosis) and broken bones (fractures). If you have coughing, wheezing, and shortness of breath, those symptoms may get better when you quit. You may also get sick less often. If you are pregnant, quitting smoking can help to lower your chances of having a baby of low birth weight. What can I do to help me quit smoking? Talk with your doctor about what can help you quit smoking. Some   things you can do (strategies) include:  Quitting smoking totally, instead of slowly cutting back how much you smoke over a period of time.  Going to in-person counseling. You are more likely to quit if you go to many counseling sessions.  Using resources and support systems, such as:  Online chats with a counselor.  Phone quitlines.  Printed self-help materials.  Support groups or group counseling.  Text messaging  programs.  Mobile phone apps or applications.  Taking medicines. Some of these medicines may have nicotine in them. If you are pregnant or breastfeeding, do not take any medicines to quit smoking unless your doctor says it is okay. Talk with your doctor about counseling or other things that can help you. Talk with your doctor about using more than one strategy at the same time, such as taking medicines while you are also going to in-person counseling. This can help make quitting easier. What things can I do to make it easier to quit? Quitting smoking might feel very hard at first, but there is a lot that you can do to make it easier. Take these steps:  Talk to your family and friends. Ask them to support and encourage you.  Call phone quitlines, reach out to support groups, or work with a counselor.  Ask people who smoke to not smoke around you.  Avoid places that make you want (trigger) to smoke, such as:  Bars.  Parties.  Smoke-break areas at work.  Spend time with people who do not smoke.  Lower the stress in your life. Stress can make you want to smoke. Try these things to help your stress:  Getting regular exercise.  Deep-breathing exercises.  Yoga.  Meditating.  Doing a body scan. To do this, close your eyes, focus on one area of your body at a time from head to toe, and notice which parts of your body are tense. Try to relax the muscles in those areas.  Download or buy apps on your mobile phone or tablet that can help you stick to your quit plan. There are many free apps, such as QuitGuide from the CDC (Centers for Disease Control and Prevention). You can find more support from smokefree.gov and other websites. This information is not intended to replace advice given to you by your health care provider. Make sure you discuss any questions you have with your health care provider. Document Released: 07/12/2009 Document Revised: 05/13/2016 Document Reviewed:  01/30/2015 Elsevier Interactive Patient Education  2017 Elsevier Inc.  

## 2017-01-16 ENCOUNTER — Encounter: Payer: Self-pay | Admitting: Vascular Surgery

## 2017-01-26 ENCOUNTER — Encounter: Payer: Self-pay | Admitting: Vascular Surgery

## 2017-01-26 ENCOUNTER — Ambulatory Visit (INDEPENDENT_AMBULATORY_CARE_PROVIDER_SITE_OTHER): Payer: Medicare Other | Admitting: Vascular Surgery

## 2017-01-26 VITALS — BP 138/83 | HR 90 | Temp 97.8°F | Resp 16 | Ht 66.0 in | Wt 121.9 lb

## 2017-01-26 DIAGNOSIS — I83893 Varicose veins of bilateral lower extremities with other complications: Secondary | ICD-10-CM | POA: Diagnosis not present

## 2017-01-26 NOTE — Progress Notes (Signed)
Subjective:     Patient ID: [name], female   DOB: 25-Jan-1946, 71 y.o.   MRN: [id number]  HPI This 71 year old female returns for further evaluation regarding her painful varicosities in both lower extremities. She underwent staged laser ablation procedures of bilateral great saphenous veins for reflux with painful varicosities. She continues to have some itching and burning discomfort and some prominent veins in both ankle areas and in the posterior calf areas. She has no edema. She states she has not had a history of DVT thrombophlebitis stasis ulcers or bleeding.  Past Medical History:  Diagnosis Date  . AAA (abdominal aortic aneurysm) ([place])    saw Dr Kellie Simmering 11/2013.  2.7 cm  . Arthritis   . Chicken pox   . Contact lens/glasses fitting    wears contacts or glasses  . DDD (degenerative disc disease), lumbar   . GERD (gastroesophageal reflux disease)   . History of blood transfusion   . Hyperlipidemia   . IBS (irritable bowel syndrome)    diet contolled  . Mitral valve prolapse   . RBBB   . Seasonal allergies   . Varicose veins     Social History  Substance Use Topics  . Smoking status: Current Every Day Smoker    Packs/day: 0.50    Years: 30.00    Types: Cigarettes  . Smokeless tobacco: Never Used  . Alcohol use 0.0 oz/week     Comment: very occasional, not monthly    Family History  Problem Relation Age of Onset  . Early death Mother     aortic aneurysm  . Vasculitis Mother     temporal arteritis  . Aortic aneurysm Mother   . Heart disease Mother     before age 1  . Hyperlipidemia Mother   . Hypertension Mother   . COPD Sister   . Hypertension Sister   . Mental illness Sister   . Depression Sister   . Heart disease Sister     before age 6  . Cancer Maternal Aunt 84    brain  . Heart disease Maternal Grandmother     aortic aneurysm  . Aortic aneurysm Maternal Grandmother   . Hypertension Sister   . Breast cancer Neg Hx     Allergies  Allergen  Reactions  . Bee Venom     Other reaction(s): Redness  . Gabapentin Diarrhea     Current Outpatient Prescriptions:  .  cetirizine (ZYRTEC) 10 MG tablet, Take 10 mg by mouth daily., Disp: , Rfl:  .  cyclobenzaprine (FLEXERIL) 5 MG tablet, , Disp: , Rfl:  .  HYDROcodone-acetaminophen (NORCO) 7.5-325 MG tablet, , Disp: , Rfl:   Vitals:   01/26/17 1257  BP: 138/83  Pulse: 90  Resp: 16  Temp: 97.8 F (36.6 C)  TempSrc: Oral  SpO2: 97%  Weight: 121 lb 14.4 oz (55.3 kg)  Height: 5\' 6"  (1.676 m)    Body mass index is 19.68 kg/m.         Review of Systems Denies chest pain, dyspnea on exertion, PND, orthopnea, hemoptysis    Objective:   Physical Exam BP 138/83 (BP Location: Left Arm, Patient Position: Sitting, Cuff Size: Normal)   Pulse 90   Temp 97.8 F (36.6 C) (Oral)   Resp 16   Ht 5\' 6"  (1.676 m)   Wt 121 lb 14.4 oz (55.3 kg)   SpO2 97%   BMI 19.68 kg/m   Gen. thin female in no apparent distress alert and oriented  3 Lungs no rhonchi or wheezing Both legs with prominent pattern of reticular veins and medial malleolar area. A few small varicosities noted in the posterior calf bilaterally. No edema noted distally. No ulcerations noted. 2+ dorsalis pedis pulse palpable bilaterally.     Assessment:     Status post bilateral great saphenous vein laser ablation for gross reflux with painful varicosities with residual varicosities and reticular veins bilaterally-symptomatic    Plan:     Patient needs #1  2 units of sclerotherapy right leg followed by #2  2 units of sclerotherapy left leg This will complete patient's treatment regimen We will proceed with precertification to perform this in the near future

## 2017-02-13 ENCOUNTER — Encounter: Payer: Self-pay | Admitting: *Deleted

## 2017-02-25 ENCOUNTER — Ambulatory Visit (INDEPENDENT_AMBULATORY_CARE_PROVIDER_SITE_OTHER): Payer: Medicare Other | Admitting: *Deleted

## 2017-02-25 DIAGNOSIS — I83893 Varicose veins of bilateral lower extremities with other complications: Secondary | ICD-10-CM

## 2017-02-25 NOTE — Progress Notes (Signed)
X=.3% Sotradecol administered with a 27g butterfly.  Patient received a total of 18cc.  Treated all areas of concern with three syringes. Combo of retics and spiders. Easy access. Tol well. Will see her on 7/18 for #2.    Compression stockings applied: Yes.

## 2017-03-05 ENCOUNTER — Encounter: Payer: Self-pay | Admitting: *Deleted

## 2017-03-17 ENCOUNTER — Encounter (HOSPITAL_COMMUNITY): Payer: Medicare Other

## 2017-03-17 ENCOUNTER — Ambulatory Visit: Payer: Medicare Other | Admitting: Vascular Surgery

## 2017-04-02 ENCOUNTER — Other Ambulatory Visit: Payer: Self-pay | Admitting: Physician Assistant

## 2017-04-02 DIAGNOSIS — E2839 Other primary ovarian failure: Secondary | ICD-10-CM

## 2017-04-08 ENCOUNTER — Encounter: Payer: Self-pay | Admitting: *Deleted

## 2017-04-14 ENCOUNTER — Other Ambulatory Visit: Payer: Medicare Other

## 2017-04-15 ENCOUNTER — Ambulatory Visit: Payer: Medicare Other | Admitting: *Deleted

## 2017-04-17 ENCOUNTER — Ambulatory Visit
Admission: RE | Admit: 2017-04-17 | Discharge: 2017-04-17 | Disposition: A | Discharge status: Home / Self Care | Payer: Medicare Other | Source: Ambulatory Visit | Attending: Physician Assistant | Admitting: Physician Assistant

## 2017-04-17 DIAGNOSIS — E2839 Other primary ovarian failure: Secondary | ICD-10-CM

## 2017-04-22 ENCOUNTER — Ambulatory Visit (INDEPENDENT_AMBULATORY_CARE_PROVIDER_SITE_OTHER): Payer: Medicare Other | Admitting: *Deleted

## 2017-04-22 DIAGNOSIS — I83893 Varicose veins of bilateral lower extremities with other complications: Secondary | ICD-10-CM

## 2017-04-22 NOTE — Progress Notes (Signed)
X=.3% Sotradecol administered with a 27g butterfly.  Patient received a total of 9cc.  Pt having a good result from her first tx. Just cleaned up any remaining open vessels. She is very pleased with her results so far. Will follow prn.    Compression stockings applied: Yes.

## 2017-11-06 ENCOUNTER — Other Ambulatory Visit: Payer: Self-pay | Admitting: Orthopedic Surgery

## 2017-11-06 DIAGNOSIS — R531 Weakness: Secondary | ICD-10-CM

## 2017-11-06 DIAGNOSIS — R52 Pain, unspecified: Secondary | ICD-10-CM

## 2017-11-11 ENCOUNTER — Ambulatory Visit
Admission: RE | Admit: 2017-11-11 | Discharge: 2017-11-11 | Disposition: A | Discharge status: Home / Self Care | Payer: Medicare Other | Source: Ambulatory Visit | Attending: Orthopedic Surgery | Admitting: Orthopedic Surgery

## 2017-11-11 DIAGNOSIS — R531 Weakness: Secondary | ICD-10-CM

## 2017-11-11 DIAGNOSIS — R52 Pain, unspecified: Secondary | ICD-10-CM

## 2017-11-11 MED ORDER — GADOBENATE DIMEGLUMINE 529 MG/ML IV SOLN: 20.0000 mL | Freq: Once | INTRAVENOUS | Status: DC | PRN

## 2017-12-15 DIAGNOSIS — Z9889 Other specified postprocedural states: Secondary | ICD-10-CM

## 2017-12-15 HISTORY — DX: Other specified postprocedural states: Z98.890

## 2017-12-16 ENCOUNTER — Ambulatory Visit: Payer: Medicare Other | Admitting: Physical Therapy

## 2017-12-21 ENCOUNTER — Ambulatory Visit: Payer: Medicare Other | Admitting: Physical Therapy

## 2017-12-23 ENCOUNTER — Other Ambulatory Visit: Payer: Self-pay

## 2017-12-23 ENCOUNTER — Ambulatory Visit: Payer: Medicare Other | Attending: Orthopedic Surgery | Admitting: Physical Therapy

## 2017-12-23 ENCOUNTER — Encounter: Payer: Self-pay | Admitting: Physical Therapy

## 2017-12-23 DIAGNOSIS — M25511 Pain in right shoulder: Secondary | ICD-10-CM | POA: Insufficient documentation

## 2017-12-23 DIAGNOSIS — G8929 Other chronic pain: Secondary | ICD-10-CM | POA: Insufficient documentation

## 2017-12-23 DIAGNOSIS — M25611 Stiffness of right shoulder, not elsewhere classified: Secondary | ICD-10-CM | POA: Insufficient documentation

## 2017-12-23 NOTE — Therapy (Signed)
[facility] [place], [place], [address] Phone: [phone]   Fax:  [phone]  Physical Therapy Evaluation  Patient Details  Name: [name] MRN: [id number] Date of Birth: 11/24/1945 Referring Provider: Lara Mulch MD   Encounter Date: 12/23/2017  PT End of Session - 12/23/17 1325    Visit Number  1    Number of Visits  12    Date for PT Re-Evaluation  03/23/18    Authorization Type  FOTO AT LEAST EVERY 5TH VISIT.    PT Start Time  1256    PT Stop Time  1333    PT Time Calculation (min)  37 min    Activity Tolerance  Patient tolerated treatment well    Behavior During Therapy  WFL for tasks assessed/performed       Past Medical History:  Diagnosis Date  . AAA (abdominal aortic aneurysm) ([place])    saw Dr Kellie Simmering 11/2013.  2.7 cm  . Arthritis   . Chicken pox   . Contact lens/glasses fitting    wears contacts or glasses  . DDD (degenerative disc disease), lumbar   . GERD (gastroesophageal reflux disease)   . History of blood transfusion   . Hyperlipidemia   . IBS (irritable bowel syndrome)    diet contolled  . Mitral valve prolapse   . RBBB   . Seasonal allergies   . Varicose veins     Past Surgical History:  Procedure Laterality Date  . APPENDECTOMY    . BACK SURGERY N/A [id number]   3 back surgeries,  . BREAST BIOPSY  1989,05   lt br bx  . BREAST EXCISIONAL BIOPSY Bilateral   . CHOLECYSTECTOMY    . EXCISION/RELEASE BURSA HIP Right 05/12/2013   Procedure: EXCISION RIGHT TROCHANTERIC BURSA/CALCIFICATION ;  Surgeon: Ninetta Lights, MD;  Location: [facility];  Service: Orthopedics;  Laterality: Right;  . FASCIOTOMY Right 05/12/2013   Procedure: RIGHT FASCIOTOMY HIP ANY TYPE;  Surgeon: Ninetta Lights, MD;  Location: [facility];  Service: Orthopedics;  Laterality: Right;  . JOINT REPLACEMENT    . NM MYOCAR PERF WALL MOTION  04/13/2003   negative  . TONSILLECTOMY    .  TONSILLECTOMY AND ADENOIDECTOMY  1950  . TOTAL HIP ARTHROPLASTY Left 2006   lt total hip  . TOTAL HIP ARTHROPLASTY  2011   rt total hip  . US ECHOCARDIOGRAPHY  04/13/2003   EF 65%, mild TR w/suggestion of mild pulmonary hypertension,thickened anterior leaflet,central mild MR.  Marland Kitchen VAGINAL HYSTERECTOMY  1993    There were no vitals filed for this visit.   Subjective Assessment - 12/23/17 1319    Subjective  The patient underwent a right shoulder SAD, DCR on 10/11/17.  She ispleased wiht her progress thus far.  Her pain-level is rated at a 6/10 and can go a bithigher with movement.  Ice decreases pain and "daily life" increase pain.    Pertinent History  Arthritis; DDD.    Patient Stated Goals  Use right arm without pain.    Currently in Pain?  Yes    Pain Score  6     Pain Location  Shoulder    Pain Orientation  Right    Pain Descriptors / Indicators  Aching    Pain Type  Surgical pain    Pain Onset  More than a month ago    Pain Frequency  Constant    Aggravating Factors   See above.  Pain Relieving Factors  See above.         [facility] PT Assessment - 12/23/17 0001      Assessment   Medical Diagnosis  S/p right shoulder SAD, DCR.    Referring Provider  Lara Mulch MD    Onset Date/Surgical Date  -- 10/11/17      Precautions   Precautions  None      Restrictions   Weight Bearing Restrictions  No      Balance Screen   Has the patient fallen in the past 6 months  No    Has the patient had a decrease in activity level because of a fear of falling?   No    Is the patient reluctant to leave their home because of a fear of falling?   No      Home Environment   Living Environment  Private residence      Prior Function   Level of Independence  Independent      Observation/Other Assessments   Observations  Patient still has a great deal of ecchymosis over her right UE.    Focus on Therapeutic Outcomes (FOTO)   96% limitation.      Posture/Postural Control    Posture/Postural Control  Postural limitations    Postural Limitations  Rounded Shoulders;Forward head      ROM / Strength   AROM / PROM / Strength  AROM      AROM   Overall AROM Comments  AA right shoulder flexion in supine= 145 degrees with ER and IR= 70 degrees.      Palpation   Palpation comment  CC is tenderness over right ACJ and mild pain in area of incsional sites.              No data recorded  Objective measurements completed on examination: See above findings.      [facility] Adult PT Treatment/Exercise - 12/23/17 0001      Modalities   Modalities  Electrical Stimulation;Vasopneumatic      Electrical Stimulation   Electrical Stimulation Location  Right shoulder    Electrical Stimulation Action  IFC    Electrical Stimulation Parameters  80-150 Hz x 10 minutes.    Electrical Stimulation Goals  Pain      Vasopneumatic   Number Minutes Vasopneumatic   10 minutes    Vasopnuematic Location   -- Right shoulder.    Vasopneumatic Pressure  Low               PT Short Term Goals - 12/23/17 1337      PT SHORT TERM GOAL #1   Title  STG's=LTG's.        PT Long Term Goals - 12/23/17 1338      PT LONG TERM GOAL #1   Title  Independent with a HEP.    Time  6    Period  Weeks    Status  New      PT LONG TERM GOAL #2   Title  Active right shoulder flexion to 155-160 degrees so the patient can easily reach overhead.    Time  6    Period  Weeks    Status  New      PT LONG TERM GOAL #3   Title  Right shoulder ER to 80 degrees to make donning/doffing apparel easier.    Time  6    Period  Weeks    Status  New      PT LONG  TERM GOAL #4   Title  Increase right shoulder strength to a solid 4+/5 to increase stability for performance of functional activities.    Time  6    Period  Weeks    Status  New      PT LONG TERM GOAL #5   Title  Perform ADL's wih pain not > 3/10.    Time  6    Period  Weeks    Status  New             Plan - 12/23/17  1327    Clinical Impression Statement  The patient presents to OPPT s/p right shoulder SAD, DCR performed on 10/11/17.  She is doing very well thus far with overall very good range of motion.  Her functional use of her right UE, however, is significantly impaired due to her recent surgical intervention.  Patient is expected to do well with skilled physical therapy intervention per protocol.    History and Personal Factors relevant to plan of care:  Arthritis; DDD.    Clinical Presentation  Stable    Clinical Presentation due to:  Excellent sugical outcome.    Clinical Decision Making  Low    Rehab Potential  Excellent    PT Frequency  2x / week    PT Duration  6 weeks    PT Treatment/Interventions  ADLs/Self Care Home Management;Cryotherapy;Electrical Stimulation;Ultrasound;Moist Heat;Therapeutic activities;Therapeutic exercise;Patient/family education;Neuromuscular re-education;Manual techniques;Passive range of motion;Vasopneumatic Device    PT Next Visit Plan  PROM right shoulder' pulleys; UE ranger on wall; wall ladder; supine cane exercises; shoulder isometrics then begin yellow therapy band RW4; e'stim and vasponeumatic.  Eventual progression into PRE's.       Patient will benefit from skilled therapeutic intervention in order to improve the following deficits and impairments:  Decreased activity tolerance, Decreased range of motion, Pain  Visit Diagnosis: Chronic right shoulder pain - Plan: PT plan of care cert/re-cert  Stiffness of right shoulder, not elsewhere classified - Plan: PT plan of care cert/re-cert     Problem List Patient Active Problem List   Diagnosis Date Noted  . AAA (abdominal aortic aneurysm) without rupture ([place]) 08/25/2016  . Varicose veins of bilateral lower extremities with other complications 68/08/7516  . Right hip pain 05/10/2014  . Ceruminosis 05/10/2014  . Unspecified vitamin D deficiency 05/10/2014  . Preventative health care 05/10/2014  . Otitis  externa of both ears 05/10/2014  . Back pain with radiation 01/06/2014  . Lumbar degenerative disc disease 01/06/2014  . Abdominal aneurysm without mention of rupture 11/29/2013  . Arthralgia 02/07/2013  . Blood in urine 02/02/2013  . Mitral valve prolapse 01/31/2013  . Smoker unmotivated to quit 01/31/2013    Hani Patnode, [place] MPT 12/23/2017, 1:43 PM  [facility] [place], [place], [address] Phone: [phone]   Fax:  [phone]  Name: [name] MRN: [id number] Date of Birth: [birth year]

## 2017-12-28 ENCOUNTER — Encounter: Payer: Self-pay | Admitting: Physical Therapy

## 2017-12-28 ENCOUNTER — Ambulatory Visit: Payer: Medicare Other | Attending: Orthopedic Surgery | Admitting: Physical Therapy

## 2017-12-28 DIAGNOSIS — G8929 Other chronic pain: Secondary | ICD-10-CM | POA: Diagnosis present

## 2017-12-28 DIAGNOSIS — M25511 Pain in right shoulder: Secondary | ICD-10-CM | POA: Diagnosis not present

## 2017-12-28 DIAGNOSIS — M25611 Stiffness of right shoulder, not elsewhere classified: Secondary | ICD-10-CM | POA: Insufficient documentation

## 2017-12-28 NOTE — Therapy (Signed)
[facility] [place], [place], [address] Phone: [phone]   Fax:  [phone]  Physical Therapy Treatment  Patient Details  Name: [name] MRN: [id number] Date of Birth: 1945/12/05 Referring Provider: Lara Mulch MD   Encounter Date: 12/28/2017  PT End of Session - 12/28/17 1327    Visit Number  2    Number of Visits  12    Date for PT Re-Evaluation  03/23/18    Authorization Type  FOTO AT LEAST EVERY 5TH VISIT.    PT Start Time  1258    PT Stop Time  1344    PT Time Calculation (min)  46 min    Activity Tolerance  Patient tolerated treatment well    Behavior During Therapy  WFL for tasks assessed/performed       Past Medical History:  Diagnosis Date  . AAA (abdominal aortic aneurysm) ([place])    saw Dr Kellie Simmering 11/2013.  2.7 cm  . Arthritis   . Chicken pox   . Contact lens/glasses fitting    wears contacts or glasses  . DDD (degenerative disc disease), lumbar   . GERD (gastroesophageal reflux disease)   . History of blood transfusion   . Hyperlipidemia   . IBS (irritable bowel syndrome)    diet contolled  . Mitral valve prolapse   . RBBB   . Seasonal allergies   . Varicose veins     Past Surgical History:  Procedure Laterality Date  . APPENDECTOMY    . BACK SURGERY N/A [phone]   3 back surgeries,  . BREAST BIOPSY  1989,05   lt br bx  . BREAST EXCISIONAL BIOPSY Bilateral   . CHOLECYSTECTOMY    . EXCISION/RELEASE BURSA HIP Right 05/12/2013   Procedure: EXCISION RIGHT TROCHANTERIC BURSA/CALCIFICATION ;  Surgeon: Ninetta Lights, MD;  Location: [place] [facility];  Service: Orthopedics;  Laterality: Right;  . FASCIOTOMY Right 05/12/2013   Procedure: RIGHT FASCIOTOMY HIP ANY TYPE;  Surgeon: Ninetta Lights, MD;  Location: [place];  Service: Orthopedics;  Laterality: Right;  . JOINT REPLACEMENT    . NM MYOCAR PERF WALL MOTION  04/13/2003   negative  . TONSILLECTOMY    .  TONSILLECTOMY AND ADENOIDECTOMY  1950  . TOTAL HIP ARTHROPLASTY Left 2006   lt total hip  . TOTAL HIP ARTHROPLASTY  2011   rt total hip  . US ECHOCARDIOGRAPHY  04/13/2003   EF 65%, mild TR w/suggestion of mild pulmonary hypertension,thickened anterior leaflet,central mild MR.  Marland Kitchen VAGINAL HYSTERECTOMY  1993    There were no vitals filed for this visit.  Subjective Assessment - 12/28/17 1301    Subjective  No new complaints.    Patient Stated Goals  Use right arm without pain.    Pain Score  5     Pain Location  Shoulder    Pain Orientation  Right    Pain Descriptors / Indicators  Aching    Pain Onset  More than a month ago                       [facility] Adult PT Treatment/Exercise - 12/28/17 0001      Exercises   Exercises  Shoulder      Shoulder Exercises: Pulleys   Flexion Limitations  5 minutes.      Modalities   Modalities  Electrical Stimulation;Moist Heat      Moist Heat Therapy   Number Minutes Moist  Heat  15 Minutes    Moist Heat Location  -- Right shoulder.      Acupuncturist Location  Right shoulder.    Electrical Stimulation Action  Pre-mod.    Electrical Stimulation Parameters  80-150 Hz x 15 minutes.    Electrical Stimulation Goals  Pain      Manual Therapy   Manual Therapy  Passive ROM    Passive ROM  In supine:  PROM to patient's right shoulder into flexion, ER and IR x 18 minutes with low load long duration stretching.               PT Short Term Goals - 12/23/17 1337      PT SHORT TERM GOAL #1   Title  STG's=LTG's.        PT Long Term Goals - 12/23/17 1338      PT LONG TERM GOAL #1   Title  Independent with a HEP.    Time  6    Period  Weeks    Status  New      PT LONG TERM GOAL #2   Title  Active right shoulder flexion to 155-160 degrees so the patient can easily reach overhead.    Time  6    Period  Weeks    Status  New      PT LONG TERM GOAL #3   Title  Right shoulder ER to  80 degrees to make donning/doffing apparel easier.    Time  6    Period  Weeks    Status  New      PT LONG TERM GOAL #4   Title  Increase right shoulder strength to a solid 4+/5 to increase stability for performance of functional activities.    Time  6    Period  Weeks    Status  New      PT LONG TERM GOAL #5   Title  Perform ADL's wih pain not > 3/10.    Time  6    Period  Weeks    Status  New            Plan - 12/28/17 1329    Clinical Impression Statement  The patient did great today.  Her CC is palpable pain over her right ACJ.    PT Treatment/Interventions  ADLs/Self Care Home Management;Cryotherapy;Electrical Stimulation;Ultrasound;Moist Heat;Therapeutic activities;Therapeutic exercise;Patient/family education;Neuromuscular re-education;Manual techniques;Passive range of motion;Vasopneumatic Device    PT Next Visit Plan  PROM right shoulder' pulleys; UE ranger on wall; wall ladder; supine cane exercises; shoulder isometrics then begin yellow therapy band RW4; e'stim and vasponeumatic.  Eventual progression into PRE's.    Consulted and Agree with Plan of Care  Patient       Patient will benefit from skilled therapeutic intervention in order to improve the following deficits and impairments:  Decreased activity tolerance, Decreased range of motion, Pain  Visit Diagnosis: Chronic right shoulder pain  Stiffness of right shoulder, not elsewhere classified     Problem List Patient Active Problem List   Diagnosis Date Noted  . AAA (abdominal aortic aneurysm) without rupture ([place]) 08/25/2016  . Varicose veins of bilateral lower extremities with other complications 10/07/3233  . Right hip pain 05/10/2014  . Ceruminosis 05/10/2014  . Unspecified vitamin D deficiency 05/10/2014  . Preventative health care 05/10/2014  . Otitis externa of both ears 05/10/2014  . Back pain with radiation 01/06/2014  . Lumbar degenerative disc disease 01/06/2014  . Abdominal  aneurysm  without mention of rupture 11/29/2013  . Arthralgia 02/07/2013  . Blood in urine 02/02/2013  . Mitral valve prolapse 01/31/2013  . Smoker unmotivated to quit 01/31/2013    [name], [name] 12/28/2017, 1:52 PM  [facility] [place], [place], [address] Phone: [phone]   Fax:  [phone]  Name: [name] MRN: [id number] Date of Birth: 08-30-1946

## 2017-12-30 ENCOUNTER — Encounter: Payer: Medicare Other | Admitting: Physical Therapy

## 2018-01-01 ENCOUNTER — Encounter: Payer: Medicare Other | Admitting: Physical Therapy

## 2018-01-04 ENCOUNTER — Ambulatory Visit: Payer: Medicare Other | Admitting: Physical Therapy

## 2018-01-04 DIAGNOSIS — M25611 Stiffness of right shoulder, not elsewhere classified: Secondary | ICD-10-CM

## 2018-01-04 DIAGNOSIS — M25511 Pain in right shoulder: Secondary | ICD-10-CM | POA: Diagnosis not present

## 2018-01-04 DIAGNOSIS — G8929 Other chronic pain: Secondary | ICD-10-CM

## 2018-01-04 NOTE — Patient Instructions (Signed)
LIE ON YOUR BACK AND DO THIS.  Robber: Scapular Retraction: Elbow Flexion (Standing)   With elbows bent to 90, pinch shoulder blades together and rotate arms out, keeping elbows bent. Repeat _10___ times per set. Do _1-3___ sets per session. Do __1__ sessions per day. May use _____ pound weights.  http://orth.exer.us/948   External Rotation (Side-Lying)    Lie on left side, holding 1#__ pound ball in top hand, elbow bent 90, towel roll between arm and body. Keeping elbow bent at side, rotate forearm upward. Repeat 10__ times.  Rest _20_ seconds after set. Do 3__ sets per session. Wallace Going, PT 01/04/18 2:25 PM [facility] [address] [place], [place], [address] Phone: [phone]   Fax:  [phone]

## 2018-01-04 NOTE — Therapy (Signed)
[facility] [place], [place], [address] Phone: [phone]   Fax:  [phone]  Physical Therapy Treatment  Patient Details  Name: [name] MRN: [id number] Date of Birth: Apr 26, 1946 Referring Provider: Lara Mulch MD   Encounter Date: 01/04/2018  PT End of Session - 01/04/18 1428    Visit Number  3    Number of Visits  12    Date for PT Re-Evaluation  03/23/18    Authorization Type  FOTO AT LEAST EVERY 5TH VISIT.    PT Start Time  1346    PT Stop Time  1428    PT Time Calculation (min)  42 min    Activity Tolerance  Patient tolerated treatment well    Behavior During Therapy  WFL for tasks assessed/performed       Past Medical History:  Diagnosis Date  . AAA (abdominal aortic aneurysm) ([place])    saw Dr Kellie Simmering 11/2013.  2.7 cm  . Arthritis   . Chicken pox   . Contact lens/glasses fitting    wears contacts or glasses  . DDD (degenerative disc disease), lumbar   . GERD (gastroesophageal reflux disease)   . History of blood transfusion   . Hyperlipidemia   . IBS (irritable bowel syndrome)    diet contolled  . Mitral valve prolapse   . RBBB   . Seasonal allergies   . Varicose veins     Past Surgical History:  Procedure Laterality Date  . APPENDECTOMY    . BACK SURGERY N/A [phone]   3 back surgeries,  . BREAST BIOPSY  1989,05   lt br bx  . BREAST EXCISIONAL BIOPSY Bilateral   . CHOLECYSTECTOMY    . EXCISION/RELEASE BURSA HIP Right 05/12/2013   Procedure: EXCISION RIGHT TROCHANTERIC BURSA/CALCIFICATION ;  Surgeon: Ninetta Lights, MD;  Location: [place];  Service: Orthopedics;  Laterality: Right;  . FASCIOTOMY Right 05/12/2013   Procedure: RIGHT FASCIOTOMY HIP ANY TYPE;  Surgeon: Ninetta Lights, MD;  Location: [place];  Service: Orthopedics;  Laterality: Right;  . JOINT REPLACEMENT    . NM MYOCAR PERF WALL MOTION  04/13/2003   negative  . TONSILLECTOMY    .  TONSILLECTOMY AND ADENOIDECTOMY  1950  . TOTAL HIP ARTHROPLASTY Left 2006   lt total hip  . TOTAL HIP ARTHROPLASTY  2011   rt total hip  . US ECHOCARDIOGRAPHY  04/13/2003   EF 65%, mild TR w/suggestion of mild pulmonary hypertension,thickened anterior leaflet,central mild MR.  Marland Kitchen VAGINAL HYSTERECTOMY  1993    There were no vitals filed for this visit.  Subjective Assessment - 01/04/18 1350    Subjective  Patient states she moved a table and area rug to steam her floors and she didn't have any trouble or pain. (PT advised pt to avoid lifting anything heavy)    Pertinent History  Arthritis; DDD. SAD/DCE 12/15/17    Patient Stated Goals  Use right arm without pain.    Currently in Pain?  No/denies                       [facility] Adult PT Treatment/Exercise - 01/04/18 0001      Shoulder Exercises: Supine   External Rotation  Strengthening;Both;20 reps "Robber" position    Other Supine Exercises  PNF x 10 no resistance, x 10 with resistance      Shoulder Exercises: Sidelying   External Rotation  Strengthening;10 reps;20 reps;Weights  External Rotation Weight (lbs)  1    Other Sidelying Exercises  empty can x 30      Shoulder Exercises: Standing   Other Standing Exercises  towel/wall slides for scapulothoracic rhythm 10 each 12/2/5      Shoulder Exercises: ROM/Strengthening   Nustep  Arms and legs level 6 x 10 min      Shoulder Exercises: Stretch   Other Shoulder Stretches  pec stretch with R arm at                PT Short Term Goals - 12/23/17 1337      PT SHORT TERM GOAL #1   Title  STG's=LTG's.        PT Long Term Goals - 12/23/17 1338      PT LONG TERM GOAL #1   Title  Independent with a HEP.    Time  6    Period  Weeks    Status  New      PT LONG TERM GOAL #2   Title  Active right shoulder flexion to 155-160 degrees so the patient can easily reach overhead.    Time  6    Period  Weeks    Status  New      PT LONG TERM GOAL #3   Title   Right shoulder ER to 80 degrees to make donning/doffing apparel easier.    Time  6    Period  Weeks    Status  New      PT LONG TERM GOAL #4   Title  Increase right shoulder strength to a solid 4+/5 to increase stability for performance of functional activities.    Time  6    Period  Weeks    Status  New      PT LONG TERM GOAL #5   Title  Perform ADL's wih pain not > 3/10.    Time  6    Period  Weeks    Status  New            Plan - 01/04/18 1429    Clinical Impression Statement  Patient did very well with TE today with no complaints of pain.    PT Treatment/Interventions  ADLs/Self Care Home Management;Cryotherapy;Electrical Stimulation;Ultrasound;Moist Heat;Therapeutic activities;Therapeutic exercise;Patient/family education;Neuromuscular re-education;Manual techniques;Passive range of motion;Vasopneumatic Device    PT Next Visit Plan  Assess response to increased TE. Continue cautiously as indicated. Add yellow RW4;    PT Home Exercise Plan  [facility] ER and scapular retraction in supine    Consulted and Agree with Plan of Care  Patient       Patient will benefit from skilled therapeutic intervention in order to improve the following deficits and impairments:  Decreased activity tolerance, Decreased range of motion, Pain  Visit Diagnosis: Chronic right shoulder pain  Stiffness of right shoulder, not elsewhere classified     Problem List Patient Active Problem List   Diagnosis Date Noted  . AAA (abdominal aortic aneurysm) without rupture ([place]) 08/25/2016  . Varicose veins of bilateral lower extremities with other complications 32/95/1884  . Right hip pain 05/10/2014  . Ceruminosis 05/10/2014  . Unspecified vitamin D deficiency 05/10/2014  . Preventative health care 05/10/2014  . Otitis externa of both ears 05/10/2014  . Back pain with radiation 01/06/2014  . Lumbar degenerative disc disease 01/06/2014  . Abdominal aneurysm without mention of rupture 11/29/2013  .  Arthralgia 02/07/2013  . Blood in urine 02/02/2013  . Mitral valve prolapse 01/31/2013  .  Smoker unmotivated to quit 01/31/2013    Smriti Barkow PT 01/04/2018, 2:36 PM  [facility] [place], [place], [address] Phone: [phone]   Fax:  [phone]  Name: [name] MRN: [id number] Date of Birth: [birth year]

## 2018-01-06 ENCOUNTER — Encounter: Payer: Medicare Other | Admitting: Physical Therapy

## 2018-01-11 ENCOUNTER — Ambulatory Visit: Payer: Medicare Other | Admitting: Physical Therapy

## 2018-01-11 ENCOUNTER — Encounter: Payer: Self-pay | Admitting: Physical Therapy

## 2018-01-11 DIAGNOSIS — G8929 Other chronic pain: Secondary | ICD-10-CM

## 2018-01-11 DIAGNOSIS — M25511 Pain in right shoulder: Principal | ICD-10-CM

## 2018-01-11 DIAGNOSIS — M25611 Stiffness of right shoulder, not elsewhere classified: Secondary | ICD-10-CM

## 2018-01-11 NOTE — Therapy (Addendum)
[facility] [place], [place], [address] Phone: [phone]   Fax:  [phone]  Physical Therapy Treatment  Patient Details  Name: [name] MRN: [id number] Date of Birth: 16-May-1946 Referring Provider: Lara Mulch MD   Encounter Date: 01/11/2018  PT End of Session - 01/11/18 1304    Visit Number  4    Number of Visits  12    Date for PT Re-Evaluation  03/23/18    Authorization Type  FOTO AT LEAST EVERY 5TH VISIT.    PT Start Time  1302    PT Stop Time  1350    PT Time Calculation (min)  48 min    Activity Tolerance  Patient tolerated treatment well    Behavior During Therapy  WFL for tasks assessed/performed       Past Medical History:  Diagnosis Date  . AAA (abdominal aortic aneurysm) ([place])    saw Dr Kellie Simmering 11/2013.  2.7 cm  . Arthritis   . Chicken pox   . Contact lens/glasses fitting    wears contacts or glasses  . DDD (degenerative disc disease), lumbar   . GERD (gastroesophageal reflux disease)   . History of blood transfusion   . Hyperlipidemia   . IBS (irritable bowel syndrome)    diet contolled  . Mitral valve prolapse   . RBBB   . Seasonal allergies   . Varicose veins     Past Surgical History:  Procedure Laterality Date  . APPENDECTOMY    . BACK SURGERY N/A [phone]   3 back surgeries,  . BREAST BIOPSY  1989,05   lt br bx  . BREAST EXCISIONAL BIOPSY Bilateral   . CHOLECYSTECTOMY    . EXCISION/RELEASE BURSA HIP Right 05/12/2013   Procedure: EXCISION RIGHT TROCHANTERIC BURSA/CALCIFICATION ;  Surgeon: Ninetta Lights, MD;  Location: [place];  Service: Orthopedics;  Laterality: Right;  . FASCIOTOMY Right 05/12/2013   Procedure: RIGHT FASCIOTOMY HIP ANY TYPE;  Surgeon: Ninetta Lights, MD;  Location: [place];  Service: Orthopedics;  Laterality: Right;  . JOINT REPLACEMENT    . NM MYOCAR PERF WALL MOTION  04/13/2003   negative  . TONSILLECTOMY    .  TONSILLECTOMY AND ADENOIDECTOMY  1950  . TOTAL HIP ARTHROPLASTY Left 2006   lt total hip  . TOTAL HIP ARTHROPLASTY  2011   rt total hip  . US ECHOCARDIOGRAPHY  04/13/2003   EF 65%, mild TR w/suggestion of mild pulmonary hypertension,thickened anterior leaflet,central mild MR.  Marland Kitchen VAGINAL HYSTERECTOMY  1993    There were no vitals filed for this visit.  Subjective Assessment - 01/11/18 1302    Subjective  Patient sees MD on thursday.    Pertinent History  Arthritis; DDD. SAD/DCE 12/15/17    Patient Stated Goals  Use right arm without pain.    Currently in Pain?  Yes    Pain Score  5     Pain Location  Shoulder    Pain Orientation  Right;Anterior    Pain Descriptors / Indicators  Discomfort    Pain Type  Surgical pain    Pain Onset  More than a month ago         [facility] PT Assessment - 01/11/18 0001      Assessment   Medical Diagnosis  S/p right shoulder SAD, DCR.    Onset Date/Surgical Date  12/09/17    Next MD Visit  01/14/2018      Precautions  Precautions  None      Restrictions   Weight Bearing Restrictions  No      ROM / Strength   AROM / PROM / Strength  AROM      AROM   Overall AROM   Deficits    AROM Assessment Site  Shoulder    Right/Left Shoulder  Right    Right Shoulder Flexion  140 Degrees    Right Shoulder Internal Rotation  75 Degrees    Right Shoulder External Rotation  75 Degrees                   [facility] Adult PT Treatment/Exercise - 01/11/18 0001      Shoulder Exercises: Pulleys   Flexion  Other (comment) x4 min      Modalities   Modalities  Electrical Stimulation;Moist Heat      Moist Heat Therapy   Number Minutes Moist Heat  15 Minutes    Moist Heat Location  Shoulder      Electrical Stimulation   Electrical Stimulation Location  R shoulder    Electrical Stimulation Action  Pre-Mod    Electrical Stimulation Parameters  80-150 hz x15 min    Electrical Stimulation Goals  Pain      Manual Therapy   Manual Therapy  Passive ROM     Passive ROM  PROM of R shoulder into flexion, ER, IR with holds at end range               PT Short Term Goals - 12/23/17 1337      PT SHORT TERM GOAL #1   Title  STG's=LTG's.        PT Long Term Goals - 12/23/17 1338      PT LONG TERM GOAL #1   Title  Independent with a HEP.    Time  6    Period  Weeks    Status  New      PT LONG TERM GOAL #2   Title  Active right shoulder flexion to 155-160 degrees so the patient can easily reach overhead.    Time  6    Period  Weeks    Status  New      PT LONG TERM GOAL #3   Title  Right shoulder ER to 80 degrees to make donning/doffing apparel easier.    Time  6    Period  Weeks    Status  New      PT LONG TERM GOAL #4   Title  Increase right shoulder strength to a solid 4+/5 to increase stability for performance of functional activities.    Time  6    Period  Weeks    Status  New      PT LONG TERM GOAL #5   Title  Perform ADL's wih pain not > 3/10.    Time  6    Period  Weeks    Status  New            Plan - 01/11/18 1337    Clinical Impression Statement  Patient tolerated today's treatment well with only reports of pain surrounding the R AC joint. Patient educated regarding iontophoresis patch and PTA would communicate with evaluating PT regarding use of ionto in POC. Patient initially completed pulleys into flexion without complaint from patient. PROM of R shoulder then completed with holds at end range and no complaints from patient. AROM of R shoulder improved greatly and no instances of pain or  pain at end range. AROM R shoulder measurements detailed in today's note. Normal modalities response noted following removal of the modalities.    Rehab Potential  Excellent    PT Frequency  2x / week    PT Duration  6 weeks    PT Treatment/Interventions  ADLs/Self Care Home Management;Cryotherapy;Electrical Stimulation;Ultrasound;Moist Heat;Therapeutic activities;Therapeutic exercise;Patient/family  education;Neuromuscular re-education;Manual techniques;Passive range of motion;Vasopneumatic Device    PT Next Visit Plan  Assess response to increased TE. Continue cautiously as indicated. Add yellow RW4;    PT Home Exercise Plan  [facility] ER and scapular retraction in supine    Consulted and Agree with Plan of Care  Patient       Patient will benefit from skilled therapeutic intervention in order to improve the following deficits and impairments:  Decreased activity tolerance, Decreased range of motion, Pain  Visit Diagnosis: Chronic right shoulder pain  Stiffness of right shoulder, not elsewhere classified     Problem List Patient Active Problem List   Diagnosis Date Noted  . AAA (abdominal aortic aneurysm) without rupture ([place]) 08/25/2016  . Varicose veins of bilateral lower extremities with other complications 05/12/4817  . Right hip pain 05/10/2014  . Ceruminosis 05/10/2014  . Unspecified vitamin D deficiency 05/10/2014  . Preventative health care 05/10/2014  . Otitis externa of both ears 05/10/2014  . Back pain with radiation 01/06/2014  . Lumbar degenerative disc disease 01/06/2014  . Abdominal aneurysm without mention of rupture 11/29/2013  . Arthralgia 02/07/2013  . Blood in urine 02/02/2013  . Mitral valve prolapse 01/31/2013  . Smoker unmotivated to quit 01/31/2013    Standley Brooking, PTA 01/11/18 1:55 PM   [facility] [place], [place], [address] Phone: [phone]   Fax:  [phone]  Name: LINDSEE LABARRE MRN: [id number] Date of Birth: Apr 24, 1946  PHYSICAL THERAPY DISCHARGE SUMMARY  Visits from Start of Care: 4.  Current functional level related to goals / functional outcomes: See above.   Remaining deficits: See above.   Education / Equipment: HEP. Plan: Patient agrees to discharge.  Patient goals were not met. Patient is being discharged due to the physician's request.  ?????          Mali Applegate MPT

## 2018-03-11 ENCOUNTER — Other Ambulatory Visit: Payer: Self-pay | Admitting: Family Medicine

## 2018-03-11 DIAGNOSIS — Z1231 Encounter for screening mammogram for malignant neoplasm of breast: Secondary | ICD-10-CM

## 2018-03-31 ENCOUNTER — Ambulatory Visit: Payer: Medicare Other

## 2018-06-22 ENCOUNTER — Ambulatory Visit: Payer: Medicare Other

## 2018-07-19 ENCOUNTER — Other Ambulatory Visit: Payer: Self-pay

## 2018-07-19 DIAGNOSIS — I728 Aneurysm of other specified arteries: Secondary | ICD-10-CM

## 2018-07-20 ENCOUNTER — Encounter: Payer: Self-pay | Admitting: Family

## 2018-07-20 ENCOUNTER — Ambulatory Visit (INDEPENDENT_AMBULATORY_CARE_PROVIDER_SITE_OTHER): Payer: Medicare Other | Admitting: Family

## 2018-07-20 ENCOUNTER — Ambulatory Visit (HOSPITAL_COMMUNITY)
Admission: RE | Admit: 2018-07-20 | Discharge: 2018-07-20 | Disposition: A | Discharge status: Home / Self Care | Payer: Medicare Other | Source: Ambulatory Visit | Attending: Family | Admitting: Family

## 2018-07-20 VITALS — BP 113/70 | HR 75 | Temp 97.8°F | Resp 18 | Ht 66.0 in | Wt 121.0 lb

## 2018-07-20 DIAGNOSIS — F17200 Nicotine dependence, unspecified, uncomplicated: Secondary | ICD-10-CM

## 2018-07-20 DIAGNOSIS — I714 Abdominal aortic aneurysm, without rupture, unspecified: Secondary | ICD-10-CM

## 2018-07-20 DIAGNOSIS — I728 Aneurysm of other specified arteries: Secondary | ICD-10-CM | POA: Diagnosis present

## 2018-07-20 DIAGNOSIS — Z8249 Family history of ischemic heart disease and other diseases of the circulatory system: Secondary | ICD-10-CM | POA: Diagnosis not present

## 2018-07-20 NOTE — Progress Notes (Signed)
[facility] [place]   CC: Follow up Abdominal Aortic Aneurysm  History of Present Illness  [name] is a 72 y.o. (08-Nov-1945) female whom Dr. Kellie Simmering has been monitoring for a small aortic aneurysm which was discovered on CT scanning in 2015. At that time it was 2.7 cm in maximum diameter.  She returns today for 18 months follow up of her small AAA.   She does have a family history of rupture of an aortic aneurysm in her mother and grandmother and is quite concerned about this. She has not had any abdominal or back symptoms. She denies any claudication type symptoms.  Dr. Kellie Simmering has also been addressing her varicose veins with laser ablation right great saphenous vein and stab phlebectomy for painful varicosities due to gross reflux.   The patient denies claudication type symptoms in her legs with walking. The patient denies any history of stroke or TIA symptoms.  She states she controls her IBS with a healthy diet but had an exacerbation of it yesterday. She forgot to take the Gas-X to minimize bowel gas, to get a better ultrasound view.    Pt denies dyspnea or cough.    Diabetic: No Tobacco use: smoker  (1/2 ppd, started in her early 20's)   Past Medical History:  Diagnosis Date  . AAA (abdominal aortic aneurysm) ([place])    saw Dr Kellie Simmering 11/2013.  2.7 cm  . Arthritis   . Chicken pox   . Contact lens/glasses fitting    wears contacts or glasses  . DDD (degenerative disc disease), lumbar   . GERD (gastroesophageal reflux disease)   . History of blood transfusion   . Hyperlipidemia   . IBS (irritable bowel syndrome)    diet contolled  . Mitral valve prolapse   . RBBB   . Seasonal allergies   . Varicose veins    Past Surgical History:  Procedure Laterality Date  . APPENDECTOMY    . BACK SURGERY N/A [phone]   3 back surgeries,  . BREAST BIOPSY  1989,05   lt br bx  . BREAST EXCISIONAL BIOPSY Bilateral   . CHOLECYSTECTOMY    .  EXCISION/RELEASE BURSA HIP Right 05/12/2013   Procedure: EXCISION RIGHT TROCHANTERIC BURSA/CALCIFICATION ;  Surgeon: Ninetta Lights, MD;  Location: [facility];  Service: Orthopedics;  Laterality: Right;  . FASCIOTOMY Right 05/12/2013   Procedure: RIGHT FASCIOTOMY HIP ANY TYPE;  Surgeon: Ninetta Lights, MD;  Location: [facility];  Service: Orthopedics;  Laterality: Right;  . JOINT REPLACEMENT    . NM MYOCAR PERF WALL MOTION  04/13/2003   negative  . TONSILLECTOMY    . TONSILLECTOMY AND ADENOIDECTOMY  1950  . TOTAL HIP ARTHROPLASTY Left 2006   lt total hip  . TOTAL HIP ARTHROPLASTY  2011   rt total hip  . US ECHOCARDIOGRAPHY  04/13/2003   EF 65%, mild TR w/suggestion of mild pulmonary hypertension,thickened anterior leaflet,central mild MR.  Marland Kitchen VAGINAL HYSTERECTOMY  1993   Social History Social History   Socioeconomic History  . Marital status: Legally Separated    Spouse name: Not on file  . Number of children: Not on file  . Years of education: Not on file  . Highest education level: Not on file  Occupational History  . Not on file  Social Needs  . Financial resource strain: Not on file  . Food insecurity:    Worry: Not on file    Inability: Not on  file  . Transportation needs:    Medical: Not on file    Non-medical: Not on file  Tobacco Use  . Smoking status: Current Every Day Smoker    Packs/day: 0.50    Years: 30.00    Pack years: 15.00    Types: Cigarettes  . Smokeless tobacco: Never Used  Substance and Sexual Activity  . Alcohol use: Yes    Alcohol/week: 0.0 standard drinks    Comment: very occasional, not monthly  . Drug use: No  . Sexual activity: Never  Lifestyle  . Physical activity:    Days per week: Not on file    Minutes per session: Not on file  . Stress: Not on file  Relationships  . Social connections:    Talks on phone: Not on file    Gets together: Not on file    Attends religious service: Not on file    Active  member of club or organization: Not on file    Attends meetings of clubs or organizations: Not on file    Relationship status: Not on file  . Intimate partner violence:    Fear of current or ex partner: Not on file    Emotionally abused: Not on file    Physically abused: Not on file    Forced sexual activity: Not on file  Other Topics Concern  . Not on file  Social History Narrative  . Not on file   Family History Family History  Problem Relation Age of Onset  . Early death Mother        aortic aneurysm  . Vasculitis Mother        temporal arteritis  . Aortic aneurysm Mother   . Heart disease Mother        before age 27  . Hyperlipidemia Mother   . Hypertension Mother   . COPD Sister   . Hypertension Sister   . Mental illness Sister   . Depression Sister   . Heart disease Sister        before age 83  . Cancer Maternal Aunt 84       brain  . Heart disease Maternal Grandmother        aortic aneurysm  . Aortic aneurysm Maternal Grandmother   . Hypertension Sister   . Breast cancer Neg Hx     Current Outpatient Medications on File Prior to Visit  Medication Sig Dispense Refill  . cetirizine (ZYRTEC) 10 MG tablet Take 10 mg by mouth daily.    Marland Kitchen HYDROcodone-acetaminophen (NORCO) 7.5-325 MG tablet     . cyclobenzaprine (FLEXERIL) 5 MG tablet      No current facility-administered medications on file prior to visit.    Allergies  Allergen Reactions  . Bee Venom     Other reaction(s): Redness  . Gabapentin Diarrhea    ROS: See HPI for pertinent positives and negatives.  Physical Examination  Vitals:   07/20/18 1001  BP: 113/70  Pulse: 75  Resp: 18  Temp: 97.8 F (36.6 C)  TempSrc: Oral  SpO2: 97%  Weight: 121 lb (54.9 kg)  Height: 5\' 6"  (1.676 m)   Body mass index is 19.53 kg/m.  General: A&O x 3, WD, slender female. HEENT: Grossly intact and WNL.  Pulmonary: Sym exp, respirations are non labored, fair air movement in all fields, CTAB, no rales,  rhonchi, or wheezing. Cardiac: Regular rhythm and rate, no detected murmur.  Carotid Bruits Right Left   Negative Negative   Adominal aortic  pulse is not palpable Radial pulses are 2+ palpable                          VASCULAR EXAM:                                                                                                         LE Pulses Right Left       FEMORAL  2+ palpable  2+ palpable        POPLITEAL  1+ palpable   1+ palpable       POSTERIOR TIBIAL  not palpable   1+ palpable        DORSALIS PEDIS      ANTERIOR TIBIAL 2+ palpable  not palpable     Gastrointestinal: soft, mild generalized tenderness to palpation, -G/R, - HSM, - masses palpated, - CVAT B. Musculoskeletal: M/S 4/5 throughout except 3/5, Extremities without ischemic changes. Skin: No rashes, no ulcers, no cellulitis.   Neurologic: CN 2-12 intact, Pain and light touch intact in extremities are intact, Motor exam as listed above. Psychiatric: Normal thought content, mood appropriate to clinical situation.    DATA  AAA Duplex:  Previous size:2.9 cm (Date: 01/13/17) Right CIA: 1.8 cm, Left CIA: 1.4 cm. 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 No significant change in the aortic diameters. Increase in diameter of the right and left common iliac arteries. New finding of celiac artery aneurysm.   Current size:  3.28 cm (Date: (07/20/2018); Right CIA: 1.8 cm; Left CIA: 1.4 cm. Limited visualization of the bilateral iliac arteries due to bowel gas.  Aneursmal dilitation of the celiac artery at bifurcation of the hepatic and splenic arteries measuring 1.51 x 1.37.  Medical Decision Making  The patient is a 72 y.o. female who presents with asymptomatic AAA with slight increase in size, to 3.28 cm from 2.9 cm in 1.5  Years, based on limited visualization.  I spoke with Dr. Carlis Abbott re pt concerns, anxiety, that we  do not repair her small aneurysms now. "This is the only thing wrong with me and it's not fixed." She discussed that two of her family members had aneurysms and her concern is heightened around this.  Pt voiced satisfaction that we will schedule her to discuss this with Dr. Carlis Abbott, to address her concerns.    Consideration for repair of AAA would be made when the size is 5.0 cm, growth > 1 cm/yr, and symptomatic status.       Consideration for repair of common iliac artery aneurysm would be made when the size is 3.0 cm.   I counseled patient re smoking cessation, and patient was given a free resources re smoking cessation.        The patient was given information about AAA including signs, symptoms, treatment, and how to minimize the risk of enlargement and rupture of aneurysms.    I emphasized the importance of maximal medical management  including strict control of blood pressure, blood glucose, and lipid levels, antiplatelet agents, obtaining regular exercise, and cessation of smoking.   The patient was advised to call 911 should the patient experience sudden onset abdominal or back pain.   Thank you for allowing [place] to participate in this patient's care.  Clemon Chambers, RN, MSN, FNP-C [facility] [place] Office: [phone]  Clinic Physician: Bishop Dublin  07/20/2018, 10:31 AM

## 2018-07-20 NOTE — Patient Instructions (Addendum)
Before your next abdominal ultrasound:  Avoid gas forming foods and beverages the day before the test.   Take two Extra-Strength Gas-X capsules at bedtime the night before the test. Take another two Extra-Strength Gas-X capsules in the middle of the night if you get up to the restroom, if not, first thing in the morning with water.  Do not chew gum.      Steps to Quit Smoking Smoking tobacco can be bad for your health. It can also affect almost every organ in your body. Smoking puts you and people around you at risk for many serious long-lasting (chronic) diseases. Quitting smoking is hard, but it is one of the best things that you can do for your health. It is never too late to quit. What are the benefits of quitting smoking? When you quit smoking, you lower your risk for getting serious diseases and conditions. They can include:  Lung cancer or lung disease.  Heart disease.  Stroke.  Heart attack.  Not being able to have children (infertility).  Weak bones (osteoporosis) and broken bones (fractures).  If you have coughing, wheezing, and shortness of breath, those symptoms may get better when you quit. You may also get sick less often. If you are pregnant, quitting smoking can help to lower your chances of having a baby of low birth weight. What can I do to help me quit smoking? Talk with your doctor about what can help you quit smoking. Some things you can do (strategies) include:  Quitting smoking totally, instead of slowly cutting back how much you smoke over a period of time.  Going to in-person counseling. You are more likely to quit if you go to many counseling sessions.  Using resources and support systems, such as: ? Database administrator with a Social worker. ? Phone quitlines. ? Careers information officer. ? Support groups or group counseling. ? Text messaging programs. ? Mobile phone apps or applications.  Taking medicines. Some of these medicines may have nicotine in them.  If you are pregnant or breastfeeding, do not take any medicines to quit smoking unless your doctor says it is okay. Talk with your doctor about counseling or other things that can help you.  Talk with your doctor about using more than one strategy at the same time, such as taking medicines while you are also going to in-person counseling. This can help make quitting easier. What things can I do to make it easier to quit? Quitting smoking might feel very hard at first, but there is a lot that you can do to make it easier. Take these steps:  Talk to your family and friends. Ask them to support and encourage you.  Call phone quitlines, reach out to support groups, or work with a Social worker.  Ask people who smoke to not smoke around you.  Avoid places that make you want (trigger) to smoke, such as: ? Bars. ? Parties. ? Smoke-break areas at work.  Spend time with people who do not smoke.  Lower the stress in your life. Stress can make you want to smoke. Try these things to help your stress: ? Getting regular exercise. ? Deep-breathing exercises. ? Yoga. ? Meditating. ? Doing a body scan. To do this, close your eyes, focus on one area of your body at a time from head to toe, and notice which parts of your body are tense. Try to relax the muscles in those areas.  Download or buy apps on your mobile phone or tablet that  can help you stick to your quit plan. There are many free apps, such as QuitGuide from the CDC (Centers for Disease Control and Prevention). You can find more support from smokefree.gov and other websites.  This information is not intended to replace advice given to you by your health care provider. Make sure you discuss any questions you have with your health care provider. Document Released: 07/12/2009 Document Revised: 05/13/2016 Document Reviewed: 01/30/2015 Elsevier Interactive Patient Education  2018 Elsevier Inc.     Abdominal Aortic Aneurysm Blood pumps away from  the heart through tubes (blood vessels) called arteries. Aneurysms are weak or damaged places in the wall of an artery. It bulges out like a balloon. An abdominal aortic aneurysm happens in the main artery of the body (aorta). It can burst or tear, causing bleeding inside the body. This is an emergency. It needs treatment right away. What are the causes? The exact cause is unknown. Things that could cause this problem include:  Fat and other substances building up in the lining of a tube.  Swelling of the walls of a blood vessel.  Certain tissue diseases.  Belly (abdominal) trauma.  An infection in the main artery of the body.  What increases the risk? There are things that make it more likely for you to have an aneurysm. These include:  Being over the age of 74 years old.  Having high blood pressure (hypertension).  Being a female.  Being white.  Being very overweight (obese).  Having a family history of aneurysm.  Using tobacco products.  What are the signs or symptoms? Symptoms depend on the size of the aneurysm and how fast it grows. There may not be symptoms. If symptoms occur, they can include:  Pain (belly, side, lower back, or groin).  Feeling full after eating a small amount of food.  Feeling sick to your stomach (nauseous), throwing up (vomiting), or both.  Feeling a lump in your belly that feels like it is beating (pulsating).  Feeling like you will pass out (faint).  How is this treated?  Medicine to control blood pressure and pain.  Imaging tests to see if the aneurysm gets bigger.  Surgery. How is this prevented? To lessen your chance of getting this condition:  Stop smoking. Stop chewing tobacco.  Limit or avoid alcohol.  Keep your blood pressure, blood sugar, and cholesterol within normal limits.  Eat less salt.  Eat foods low in saturated fats and cholesterol. These are found in animal and whole dairy products.  Eat more fiber. Fiber is  found in whole grains, vegetables, and fruits.  Keep a healthy weight.  Stay active and exercise often.  This information is not intended to replace advice given to you by your health care provider. Make sure you discuss any questions you have with your health care provider. Document Released: 01/10/2013 Document Revised: 02/21/2016 Document Reviewed: 10/15/2012 Elsevier Interactive Patient Education  2017 Elsevier Inc.  

## 2018-10-05 ENCOUNTER — Ambulatory Visit: Payer: Medicare Other | Admitting: Vascular Surgery

## 2018-10-19 ENCOUNTER — Encounter: Payer: Self-pay | Admitting: Vascular Surgery

## 2018-10-19 ENCOUNTER — Ambulatory Visit: Payer: Medicare Other | Admitting: Vascular Surgery

## 2019-06-14 ENCOUNTER — Telehealth: Payer: Self-pay | Admitting: Family Medicine

## 2019-06-14 ENCOUNTER — Other Ambulatory Visit: Payer: Self-pay

## 2019-06-14 NOTE — Telephone Encounter (Signed)
Patient states that she was seen at [facility] and was tested for Covid and strep. Both test come back negative.  Patient has no other symptoms other than headache.

## 2019-06-14 NOTE — Telephone Encounter (Signed)
I have been told repeatedly not to allow patient's in the office with certain symptoms and a headache is one of them. She can do a virtual visit to establish if she would like as I'm sure she is in need of refills since she is transferring care.

## 2019-06-14 NOTE — Telephone Encounter (Signed)
Please follow [facility] policy.

## 2019-06-14 NOTE — Telephone Encounter (Signed)
Patient aware and appt changed to televisit

## 2019-06-15 ENCOUNTER — Encounter: Payer: Self-pay | Admitting: Family Medicine

## 2019-06-15 ENCOUNTER — Ambulatory Visit (INDEPENDENT_AMBULATORY_CARE_PROVIDER_SITE_OTHER): Payer: Medicare Other | Admitting: Family Medicine

## 2019-06-15 DIAGNOSIS — G894 Chronic pain syndrome: Secondary | ICD-10-CM

## 2019-06-15 DIAGNOSIS — Z23 Encounter for immunization: Secondary | ICD-10-CM

## 2019-06-15 DIAGNOSIS — F419 Anxiety disorder, unspecified: Secondary | ICD-10-CM

## 2019-06-15 DIAGNOSIS — I714 Abdominal aortic aneurysm, without rupture, unspecified: Secondary | ICD-10-CM

## 2019-06-15 DIAGNOSIS — R519 Headache, unspecified: Secondary | ICD-10-CM

## 2019-06-15 DIAGNOSIS — F17200 Nicotine dependence, unspecified, uncomplicated: Secondary | ICD-10-CM

## 2019-06-15 DIAGNOSIS — E559 Vitamin D deficiency, unspecified: Secondary | ICD-10-CM

## 2019-06-15 DIAGNOSIS — I83893 Varicose veins of bilateral lower extremities with other complications: Secondary | ICD-10-CM

## 2019-06-15 DIAGNOSIS — M5136 Other intervertebral disc degeneration, lumbar region: Secondary | ICD-10-CM

## 2019-06-15 DIAGNOSIS — R51 Headache: Secondary | ICD-10-CM

## 2019-06-15 MED ORDER — SHINGRIX 50 MCG/0.5ML IM SUSR: 0.5000 mL | Freq: Once | INTRAMUSCULAR | 0 refills | Status: AC

## 2019-06-15 MED ORDER — HYDROXYZINE HCL 50 MG PO TABS: 50.0000 mg | ORAL_TABLET | Freq: Three times a day (TID) | ORAL | 0 refills | Status: DC | PRN

## 2019-06-15 NOTE — Progress Notes (Signed)
Virtual Visit via Telephone Note  I connected with [name] on 06/15/19 at 10:05 AM by telephone and verified that I am speaking with the correct person using two identifiers. [name] is currently located at home and nobody is currently with her during this visit. The provider, [name], FNP is located in their office at time of visit.  I discussed the limitations, risks, security and privacy concerns of performing an evaluation and management service by telephone and the availability of in person appointments. I also discussed with the patient that there may be a patient responsible charge related to this service. The patient expressed understanding and agreed to proceed.  Subjective: PCP: [name], FNP  Chief Complaint  Patient presents with  . Establish Care   Patient reports she is in need of a referral to [facility] in [place]. She was previously seeing Dr. Kellie Simmering for her varicose veins and AAA but he has retired. She will now be seeing Dr. Carlis Abbott but states she was told her needed a new referral.   She was previously taking Ativan as needed for "everyday stress" as she worries about everything. She states she has not had it since Dr. Murrell Redden office closed. She has been doing "okay without it, but better with it".   Depression screen [facility] 2/9 06/15/2019 05/10/2014  Decreased Interest 0 0  Down, Depressed, Hopeless 0 0  PHQ - 2 Score 0 0  Altered sleeping 0 -  Tired, decreased energy 0 -  Change in appetite 0 -  Feeling bad or failure about yourself  0 -  Trouble concentrating 0 -  Moving slowly or fidgety/restless 0 -  Suicidal thoughts 0 -  PHQ-9 Score 0 -   GAD 7 : Generalized Anxiety Score 06/15/2019  Nervous, Anxious, on Edge 0  Control/stop worrying 3  Worry too much - different things 0  Trouble relaxing 0  Restless 0  Easily annoyed or irritable 0  Afraid - awful might happen 0  Total GAD 7 Score 3   Patient has a history of  vitamin D deficiency but states she has tried OTC vitamin D supplements and the weekly prescription and she is unable to tolerate any of them due to her IBS. She does report she tries to spend a lot of time outdoors since she knows she cannot tolerate the supplements.   Patient reports she went to the ER 2 days ago due to her headaches. She reports a CT scan was negative and her blood work looked good. We do not have access to these records. She was prescribed Zofran for nausea as it comes in waves with the headaches. It is helpful in relieving the nausea. She called Dr. Maryjean Ka, her pain management provider, who feels the headaches may be related to her neck. She has an appointment scheduled for October 6th to address this.     ROS: Per HPI  Current Outpatient Medications:  .  fluticasone (FLONASE) 50 MCG/ACT nasal spray, Place 2 sprays into the nose daily as needed., Disp: , Rfl:  .  cetirizine (ZYRTEC) 10 MG tablet, Take 10 mg by mouth daily., Disp: , Rfl:  .  ondansetron (ZOFRAN-ODT) 4 MG disintegrating tablet, Take 4 mg by mouth every 6 (six) hours as needed for nausea., Disp: , Rfl:  .  oxyCODONE-acetaminophen (PERCOCET/ROXICET) 5-325 MG tablet, Take 1 tablet by mouth 2 (two) times daily as needed for pain., Disp: , Rfl:   Allergies  Allergen  Reactions  . Bee Venom     Other reaction(s): Redness  . Cephalexin Other (See Comments)    Headaches  . Gabapentin Diarrhea   Past Medical History:  Diagnosis Date  . AAA (abdominal aortic aneurysm) ([place])    saw Dr Kellie Simmering 11/2013.  2.7 cm  . Arthritis   . Chicken pox   . DDD (degenerative disc disease), lumbar   . GERD (gastroesophageal reflux disease)   . History of blood transfusion   . Hyperlipidemia   . IBS (irritable bowel syndrome)    diet contolled  . Mitral valve prolapse   . RBBB   . Seasonal allergies   . Varicose veins     Observations/Objective: A&O  No respiratory distress or wheezing audible over the phone Mood,  judgement, and thought processes all WNL  Assessment and Plan: 1-2. AAA (abdominal aortic aneurysm) without rupture (HCC)/Varicose veins of bilateral lower extremities with other complications - Previous patient of Dr. Kellie Simmering in need of new referral to see Dr. Carlis Abbott.  - Ambulatory referral to Vascular Surgery  3. Anxiety - Discussed that we will not restart Ativan but that she can try alternatives to control anxiety. She is willing. Rx'd hydroxyzine.  - hydrOXYzine (ATARAX/VISTARIL) 50 MG tablet; Take 1 tablet (50 mg total) by mouth 3 (three) times daily as needed for anxiety.  Dispense: 30 tablet; Refill: 0  4-5. Lumbar degenerative disc disease/Chronic pain syndrome - Well controlled on current regimen. Managed by Dr. Maryjean Ka.   6. Frequent headaches - Patient to keep upcoming appointment to address on 07/05/2019.   7. Smoker unmotivated to quit - Patient reports smoking and coffee are the only things she does wrong.   8. Vitamin D deficiency - Unable to tolerate supplements so she spends a lot of time outdoors in the sun.   9. Immunization due - SHINGRIX injection; Inject 0.5 mLs into the muscle once for 1 dose.  Dispense: 0.5 mL; Refill: 0   Follow Up Instructions:  I discussed the assessment and treatment plan with the patient. The patient was provided an opportunity to ask questions and all were answered. The patient agreed with the plan and demonstrated an understanding of the instructions.   The patient was advised to call back or seek an in-person evaluation if the symptoms worsen or if the condition fails to improve as anticipated.  The above assessment and management plan was discussed with the patient. The patient verbalized understanding of and has agreed to the management plan. Patient is aware to call the clinic if symptoms persist or worsen. Patient is aware when to return to the clinic for a follow-up visit. Patient educated on when it is appropriate to go to the  emergency department.   Time call ended: 10:43  I provided 38 minutes of non-face-to-face time during this encounter.  [name], MSN, APRN, FNP-C [facility] 06/15/19

## 2019-07-05 DIAGNOSIS — R519 Headache, unspecified: Secondary | ICD-10-CM | POA: Insufficient documentation

## 2019-07-08 ENCOUNTER — Other Ambulatory Visit: Payer: Self-pay | Admitting: Neurosurgery

## 2019-07-08 DIAGNOSIS — R519 Headache, unspecified: Secondary | ICD-10-CM

## 2019-07-15 ENCOUNTER — Ambulatory Visit
Admission: RE | Admit: 2019-07-15 | Discharge: 2019-07-15 | Disposition: A | Discharge status: Home / Self Care | Payer: Medicare Other | Source: Ambulatory Visit | Attending: Neurosurgery | Admitting: Neurosurgery

## 2019-07-15 ENCOUNTER — Other Ambulatory Visit: Payer: Self-pay

## 2019-07-15 DIAGNOSIS — R519 Headache, unspecified: Secondary | ICD-10-CM

## 2019-07-19 ENCOUNTER — Ambulatory Visit: Payer: Medicare Other | Admitting: Physician Assistant

## 2019-07-22 ENCOUNTER — Other Ambulatory Visit: Payer: Self-pay

## 2019-07-22 DIAGNOSIS — I714 Abdominal aortic aneurysm, without rupture, unspecified: Secondary | ICD-10-CM

## 2019-07-25 ENCOUNTER — Telehealth (HOSPITAL_COMMUNITY): Payer: Self-pay

## 2019-07-25 NOTE — Telephone Encounter (Signed)

## 2019-07-26 ENCOUNTER — Ambulatory Visit (INDEPENDENT_AMBULATORY_CARE_PROVIDER_SITE_OTHER): Payer: Medicare Other | Admitting: Vascular Surgery

## 2019-07-26 ENCOUNTER — Ambulatory Visit (HOSPITAL_COMMUNITY)
Admission: RE | Admit: 2019-07-26 | Discharge: 2019-07-26 | Disposition: A | Discharge status: Home / Self Care | Payer: Medicare Other | Source: Ambulatory Visit | Attending: Vascular Surgery | Admitting: Vascular Surgery

## 2019-07-26 ENCOUNTER — Encounter: Payer: Self-pay | Admitting: Vascular Surgery

## 2019-07-26 ENCOUNTER — Other Ambulatory Visit: Payer: Self-pay

## 2019-07-26 VITALS — BP 135/85 | HR 85 | Temp 97.3°F | Resp 14 | Ht 66.0 in | Wt 123.0 lb

## 2019-07-26 DIAGNOSIS — I714 Abdominal aortic aneurysm, without rupture, unspecified: Secondary | ICD-10-CM

## 2019-07-26 NOTE — Progress Notes (Signed)
Patient name: [name] MRN: [id number] DOB: Nov 21, 1945 Sex: female  REASON FOR VISIT: 1 year follow-up for abdominal aortic aneurysm and common iliac artery surveillance  HPI: [name] is a 73 y.o. female with history of hyperlipidemia as well as tobacco abuse presents for ongoing surveillance of her abdominal aortic aneurysm and reported common iliac artery aneurysms.  She was previously followed by Dr. Kellie Simmering for a 2.7 cm AAA initially diagnosed in 2015.  Last seen in our clinic 07/20/2018 at which time her abdominal aneurysm measured 3.3 cm and then her right common iliac measured 1.8 cm and left common iliac measured 1.5 cm.  Also had dilation of the celiac artery that has been followed.  she reports no new abdominal or back pain.  She continues to smoke about 1/2 pack a day.  Previous abdominal surgery includes lower midline for gallbladder according to the patient.  She is very concerned about aneurysms given family history as well as several ruptured aneurysms in her family.  No pain in her legs when she walks.  No post-prandial pain.  No weight loss.  Past Medical History:  Diagnosis Date  . AAA (abdominal aortic aneurysm) ([place])    saw Dr Kellie Simmering 11/2013.  2.7 cm  . Arthritis   . Chicken pox   . DDD (degenerative disc disease), lumbar   . GERD (gastroesophageal reflux disease)    diet controlled  . History of blood transfusion   . History of lumbar fusion 08/09/2014  . Hyperlipidemia   . IBS (irritable bowel syndrome)    diet contolled  . Mild mitral regurgitation   . RBBB   . S/P arthroscopy of right shoulder 12/15/2017  . Seasonal allergies   . Varicose veins     Past Surgical History:  Procedure Laterality Date  . APPENDECTOMY    . BACK SURGERY N/A [phone]   5 back surgeries  . BREAST BIOPSY  1989,05   lt br bx  . BREAST EXCISIONAL BIOPSY Bilateral   . CATARACT EXTRACTION W/ INTRAOCULAR LENS IMPLANT Bilateral 2018  . CHOLECYSTECTOMY    .  EXCISION/RELEASE BURSA HIP Right 05/12/2013   Procedure: EXCISION RIGHT TROCHANTERIC BURSA/CALCIFICATION ;  Surgeon: Ninetta Lights, MD;  Location: [facility];  Service: Orthopedics;  Laterality: Right;  . FASCIOTOMY Right 05/12/2013   Procedure: RIGHT FASCIOTOMY HIP ANY TYPE;  Surgeon: Ninetta Lights, MD;  Location[facility];  Service: Orthopedics;  Laterality: Right;  . NM MYOCAR PERF WALL MOTION  04/13/2003   negative  . SHOULDER ARTHROSCOPY WITH OPEN ROTATOR CUFF REPAIR Right 2018  . TONSILLECTOMY AND ADENOIDECTOMY  1950  . TOTAL HIP ARTHROPLASTY Left 2006   lt total hip  . TOTAL HIP ARTHROPLASTY Right 2011  . TOTAL HIP ARTHROPLASTY Right 2016   second surgery due to recall  . US ECHOCARDIOGRAPHY  04/13/2003   EF 65%, mild TR w/suggestion of mild pulmonary hypertension,thickened anterior leaflet,central mild MR.  Marland Kitchen VAGINAL HYSTERECTOMY  1993    Family History  Problem Relation Age of Onset  . Early death Mother        aortic aneurysm  . Vasculitis Mother        temporal arteritis  . Aortic aneurysm Mother   . Heart disease Mother        before age 59  . Hyperlipidemia Mother   . Hypertension Mother   . COPD Sister   . Hypertension Sister   . Depression Sister   .  Heart disease Sister        before age 107  . Post-traumatic stress disorder Sister   . Bipolar disorder Sister   . Brain cancer Maternal Aunt 84       brain  . Heart disease Maternal Grandmother        aortic aneurysm  . Aortic aneurysm Maternal Grandmother   . Hypertension Sister   . Throat cancer Maternal Grandfather   . Breast cancer Neg Hx     SOCIAL HISTORY: Social History   Tobacco Use  . Smoking status: Current Every Day Smoker    Packs/day: 0.50    Years: 30.00    Pack years: 15.00    Types: Cigarettes  . Smokeless tobacco: Never Used  Substance Use Topics  . Alcohol use: Not Currently    Alcohol/week: 0.0 standard drinks    Comment: very occasional, not  monthly    Allergies  Allergen Reactions  . Bee Venom     Other reaction(s): Redness  . Cephalexin Other (See Comments)    Headaches  . Gabapentin Diarrhea    Current Outpatient Medications  Medication Sig Dispense Refill  . cetirizine (ZYRTEC) 10 MG tablet Take 10 mg by mouth daily.    . fluticasone (FLONASE) 50 MCG/ACT nasal spray Place 2 sprays into the nose daily as needed.    Marland Kitchen oxyCODONE-acetaminophen (PERCOCET/ROXICET) 5-325 MG tablet Take 1 tablet by mouth 2 (two) times daily as needed for pain.    . hydrOXYzine (ATARAX/VISTARIL) 50 MG tablet Take 1 tablet (50 mg total) by mouth 3 (three) times daily as needed for anxiety. (Patient not taking: Reported on 07/26/2019) 30 tablet 0  . ondansetron (ZOFRAN-ODT) 4 MG disintegrating tablet Take 4 mg by mouth every 6 (six) hours as needed for nausea.     No current facility-administered medications for this visit.     REVIEW OF SYSTEMS:  [X]  denotes positive finding, [ ]  denotes negative finding Cardiac  Comments:  Chest pain or chest pressure:    Shortness of breath upon exertion:    Short of breath when lying flat:    Irregular heart rhythm:        Vascular    Pain in calf, thigh, or hip brought on by ambulation:    Pain in feet at night that wakes you up from your sleep:     Blood clot in your veins:    Leg swelling:         Pulmonary    Oxygen at home:    Productive cough:     Wheezing:         Neurologic    Sudden weakness in arms or legs:     Sudden numbness in arms or legs:     Sudden onset of difficulty speaking or slurred speech:    Temporary loss of vision in one eye:     Problems with dizziness:         Gastrointestinal    Blood in stool:     Vomited blood:         Genitourinary    Burning when urinating:     Blood in urine:        Psychiatric    Major depression:         Hematologic    Bleeding problems:    Problems with blood clotting too easily:        Skin    Rashes or ulcers:         Constitutional  Fever or chills:      PHYSICAL EXAM: Vitals:   07/26/19 0847  BP: 135/85  Pulse: 85  Resp: 14  Temp: (!) 97.3 F (36.3 C)  TempSrc: Temporal  SpO2: 99%  Weight: 123 lb (55.8 kg)  Height: 5\' 6"  (1.676 m)    GENERAL: The patient is a well-nourished female, in no acute distress. The vital signs are documented above. CARDIAC: There is a regular rate and rhythm.  VASCULAR:  2+ femoral pulses palpable bilaterally 1+ posterior tibial pulses palpable bilaterally No lower extremity tissue loss PULMONARY: There is good air exchange bilaterally without wheezing or rales. ABDOMEN: Soft and non-tender with normal pitched bowel sounds.  No pain with deep palpation of her abdomen. MUSCULOSKELETAL: There are no major deformities or cyanosis. NEUROLOGIC: No focal weakness or paresthesias are detected. SKIN: There are no ulcers or rashes noted. PSYCHIATRIC: The patient has a normal affect.  DATA:   Duplex today suggest that her abdominal aortic aneurysm has grown from 3.3 cm 3.6 cm.  The celiac artery has grown from 1.5 to 1.56 cm.  There is a elevated velocity in the celiac artery and 70% given a velocity of 285.  The right and left common iliac arteries are unchanged at 1.8 and 1.5 cm respectively.  Assessment/Plan:  73 year old female has been followed for known abdominal aortic aneurysm as well as common iliac artery aneurysms.  Discussed that her abdominal aneurysm has shown minimal growth over the last year from 3.3 to 3.6 cm.  Current standard of care is to offer repair greater than 5 cm in women.  There is no indication for surgical repair at this time.  Her common iliac arteries are ectatic and  stable at 1.8 cm and 1.5 cm, respectively.  No indication for repair unless >3-3.5 cm typically.  Will also follow her celiac artery now 1.56 cm.  There is a 70% stenosis here based on velocity criteria but SMA widely patent and she has no evidence of mesenteric ischemia based  on her history.  Will see her again in one year with ongoing surveillance imaging.    Marty Heck, MD [facility] Office: [phone] Pager: [phone]

## 2019-07-28 ENCOUNTER — Encounter: Payer: Self-pay | Admitting: Family Medicine

## 2019-07-28 ENCOUNTER — Ambulatory Visit (INDEPENDENT_AMBULATORY_CARE_PROVIDER_SITE_OTHER): Payer: Medicare Other | Admitting: Family Medicine

## 2019-07-28 VITALS — BP 133/83 | HR 100 | Temp 99.1°F | Ht 66.0 in | Wt 122.0 lb

## 2019-07-28 DIAGNOSIS — R9389 Abnormal findings on diagnostic imaging of other specified body structures: Secondary | ICD-10-CM

## 2019-07-28 NOTE — Progress Notes (Signed)
Assessment & Plan:  1. Abnormal CT scan, neck - STAT referral placed as patient is in need of laryngoscopy due to lobular soft tissue in trachea; concern for neoplasm.  - Ambulatory referral to ENT   Follow up plan: Return for in December for annual physical.  [name], MSN, APRN, FNP-C [facility]  Subjective:   Patient ID: [name], female    DOB: [birth year], 73 y.o.   MRN: [id number]  HPI: [name] is a 73 y.o. female presenting on 07/28/2019 for Results  Patient reports Dr. Maryjean Ka' office ordered a CT of the cervical spine due to neck pain. When they gave her the results they told her she needed to follow-up with her PCP about some of the findings.   CT Cervical Spine Impression: 1. No fracture or traumatic malalignment. 2. There is a lobular soft tissue in the right trachea that is contiguous with the right thyroid gland and causes no airway obstruction. This could represent ectopic thyroid tissue extending into the trachea, neoplasm with invasion of the trachea is also considered. Direct visualization with laryngoscopy or bronchoscopy may be helpful as clinically warranted. 3. Degenerative changes in the cervical spine. 4. Emphysema at the lung apices.   ROS: Negative unless specifically indicated above in HPI.   Relevant past medical history reviewed and updated as indicated.   Allergies and medications reviewed and updated.   Current Outpatient Medications:  .  cetirizine (ZYRTEC) 10 MG tablet, Take 10 mg by mouth daily., Disp: , Rfl:  .  fluticasone (FLONASE) 50 MCG/ACT nasal spray, Place 2 sprays into the nose daily as needed., Disp: , Rfl:  .  ondansetron (ZOFRAN-ODT) 4 MG disintegrating tablet, Take 4 mg by mouth every 6 (six) hours as needed for nausea., Disp: , Rfl:  .  oxyCODONE-acetaminophen (PERCOCET/ROXICET) 5-325 MG tablet, Take 1 tablet by mouth 2 (two) times daily as needed for pain., Disp: , Rfl:   Allergies   Allergen Reactions  . Bee Venom     Other reaction(s): Redness  . Cephalexin Other (See Comments)    Headaches  . Gabapentin Diarrhea    Objective:   BP 133/83   Pulse 100   Temp 99.1 F (37.3 C) (Temporal)   Ht 5\' 6"  (1.676 m)   Wt 122 lb (55.3 kg)   SpO2 97%   BMI 19.69 kg/m    Physical Exam Vitals signs reviewed.  Constitutional:      General: She is not in acute distress.    Appearance: Normal appearance. She is underweight. She is not ill-appearing, toxic-appearing or diaphoretic.  HENT:     Head: Normocephalic and atraumatic.  Eyes:     General: No scleral icterus.       Right eye: No discharge.        Left eye: No discharge.     Conjunctiva/sclera: Conjunctivae normal.  Neck:     Musculoskeletal: Normal range of motion.  Cardiovascular:     Rate and Rhythm: Normal rate.  Pulmonary:     Effort: Pulmonary effort is normal. No respiratory distress.  Musculoskeletal: Normal range of motion.  Skin:    General: Skin is warm and dry.     Capillary Refill: Capillary refill takes less than 2 seconds.  Neurological:     General: No focal deficit present.     Mental Status: She is alert and oriented to person, place, and time. Mental status is at baseline.  Psychiatric:  Mood and Affect: Mood normal.        Behavior: Behavior normal.        Thought Content: Thought content normal.        Judgment: Judgment normal.

## 2019-07-29 ENCOUNTER — Telehealth: Payer: Self-pay | Admitting: Family Medicine

## 2019-07-29 ENCOUNTER — Encounter: Payer: Self-pay | Admitting: Family Medicine

## 2019-07-29 NOTE — Telephone Encounter (Signed)
Were you putting in a referral for her?

## 2019-07-29 NOTE — Telephone Encounter (Signed)
Yes, just placed STAT referral to ENT.

## 2019-07-29 NOTE — Telephone Encounter (Signed)
Referral sent to Dr. Lenny Pastel

## 2019-08-01 ENCOUNTER — Telehealth: Payer: Self-pay | Admitting: Family Medicine

## 2019-08-01 NOTE — Telephone Encounter (Signed)
The ENT has to do the biopsy.

## 2019-08-01 NOTE — Telephone Encounter (Signed)
Patient aware that ENT has to do Bx. Patient is wanting to know when she will likely get in with one/

## 2019-08-03 ENCOUNTER — Other Ambulatory Visit (HOSPITAL_COMMUNITY): Payer: Self-pay | Admitting: Otolaryngology

## 2019-08-03 DIAGNOSIS — E079 Disorder of thyroid, unspecified: Secondary | ICD-10-CM

## 2019-08-03 DIAGNOSIS — R221 Localized swelling, mass and lump, neck: Secondary | ICD-10-CM

## 2019-08-08 ENCOUNTER — Other Ambulatory Visit: Payer: Self-pay | Admitting: Otolaryngology

## 2019-08-09 ENCOUNTER — Other Ambulatory Visit: Payer: Self-pay

## 2019-08-09 ENCOUNTER — Encounter (HOSPITAL_BASED_OUTPATIENT_CLINIC_OR_DEPARTMENT_OTHER): Payer: Self-pay | Admitting: *Deleted

## 2019-08-11 ENCOUNTER — Other Ambulatory Visit (HOSPITAL_COMMUNITY)
Admission: RE | Admit: 2019-08-11 | Discharge: 2019-08-11 | Disposition: A | Discharge status: Home / Self Care | Payer: Medicare Other | Source: Ambulatory Visit | Attending: Otolaryngology | Admitting: Otolaryngology

## 2019-08-11 ENCOUNTER — Encounter (HOSPITAL_BASED_OUTPATIENT_CLINIC_OR_DEPARTMENT_OTHER)
Admission: RE | Admit: 2019-08-11 | Discharge: 2019-08-11 | Disposition: A | Discharge status: Home / Self Care | Payer: Medicare Other | Source: Ambulatory Visit | Attending: Otolaryngology | Admitting: Otolaryngology

## 2019-08-11 ENCOUNTER — Other Ambulatory Visit: Payer: Self-pay

## 2019-08-11 DIAGNOSIS — Z20828 Contact with and (suspected) exposure to other viral communicable diseases: Secondary | ICD-10-CM | POA: Insufficient documentation

## 2019-08-11 DIAGNOSIS — Z01812 Encounter for preprocedural laboratory examination: Secondary | ICD-10-CM | POA: Insufficient documentation

## 2019-08-12 LAB — NOVEL CORONAVIRUS, NAA (HOSP ORDER, SEND-OUT TO REF LAB; TAT 18-24 HRS): SARS-CoV-2, NAA: NOT DETECTED

## 2019-08-15 ENCOUNTER — Ambulatory Visit (HOSPITAL_BASED_OUTPATIENT_CLINIC_OR_DEPARTMENT_OTHER)
Admission: RE | Admit: 2019-08-15 | Discharge: 2019-08-15 | Disposition: A | Discharge status: Home / Self Care | Payer: Medicare Other | Attending: Otolaryngology | Admitting: Otolaryngology

## 2019-08-15 ENCOUNTER — Encounter (HOSPITAL_BASED_OUTPATIENT_CLINIC_OR_DEPARTMENT_OTHER): Payer: Self-pay

## 2019-08-15 ENCOUNTER — Encounter (HOSPITAL_BASED_OUTPATIENT_CLINIC_OR_DEPARTMENT_OTHER)
Admission: RE | Disposition: A | Discharge status: Home / Self Care | Payer: Self-pay | Source: Home / Self Care | Attending: Otolaryngology

## 2019-08-15 ENCOUNTER — Ambulatory Visit (HOSPITAL_BASED_OUTPATIENT_CLINIC_OR_DEPARTMENT_OTHER): Payer: Medicare Other | Admitting: Certified Registered"

## 2019-08-15 ENCOUNTER — Other Ambulatory Visit: Payer: Self-pay

## 2019-08-15 DIAGNOSIS — G894 Chronic pain syndrome: Secondary | ICD-10-CM | POA: Insufficient documentation

## 2019-08-15 DIAGNOSIS — J398 Other specified diseases of upper respiratory tract: Secondary | ICD-10-CM | POA: Diagnosis present

## 2019-08-15 DIAGNOSIS — Z888 Allergy status to other drugs, medicaments and biological substances status: Secondary | ICD-10-CM | POA: Diagnosis not present

## 2019-08-15 DIAGNOSIS — F17210 Nicotine dependence, cigarettes, uncomplicated: Secondary | ICD-10-CM | POA: Diagnosis not present

## 2019-08-15 DIAGNOSIS — Z881 Allergy status to other antibiotic agents status: Secondary | ICD-10-CM | POA: Insufficient documentation

## 2019-08-15 DIAGNOSIS — D381 Neoplasm of uncertain behavior of trachea, bronchus and lung: Secondary | ICD-10-CM | POA: Diagnosis not present

## 2019-08-15 DIAGNOSIS — J449 Chronic obstructive pulmonary disease, unspecified: Secondary | ICD-10-CM | POA: Insufficient documentation

## 2019-08-15 DIAGNOSIS — I739 Peripheral vascular disease, unspecified: Secondary | ICD-10-CM | POA: Insufficient documentation

## 2019-08-15 HISTORY — PX: LARYNGOSCOPY AND BRONCHOSCOPY: SHX5659

## 2019-08-15 HISTORY — DX: Chronic obstructive pulmonary disease, unspecified: J44.9

## 2019-08-15 SURGERY — LARYNGOSCOPY, WITH BRONCHOSCOPY
Anesthesia: General | Site: Throat

## 2019-08-15 MED ORDER — MIDAZOLAM HCL 2 MG/2ML IJ SOLN: 1.0000 mg | INTRAMUSCULAR | Status: DC | PRN

## 2019-08-15 MED ORDER — DEXAMETHASONE SODIUM PHOSPHATE 10 MG/ML IJ SOLN
INTRAMUSCULAR | Status: DC | PRN
  Administered 2019-08-15: 10 mg via INTRAVENOUS

## 2019-08-15 MED ORDER — SUCCINYLCHOLINE CHLORIDE 200 MG/10ML IV SOSY
PREFILLED_SYRINGE | INTRAVENOUS | Status: AC
  Filled 2019-08-15: qty 10

## 2019-08-15 MED ORDER — CLINDAMYCIN PHOSPHATE 600 MG/50ML IV SOLN
INTRAVENOUS | Status: DC | PRN
  Administered 2019-08-15: 600 mg via INTRAVENOUS

## 2019-08-15 MED ORDER — ONDANSETRON HCL 4 MG/2ML IJ SOLN
INTRAMUSCULAR | Status: DC | PRN
  Administered 2019-08-15: 4 mg via INTRAVENOUS

## 2019-08-15 MED ORDER — EPHEDRINE SULFATE-NACL 50-0.9 MG/10ML-% IV SOSY
PREFILLED_SYRINGE | INTRAVENOUS | Status: DC | PRN
  Administered 2019-08-15: 10 mg via INTRAVENOUS

## 2019-08-15 MED ORDER — LIDOCAINE 2% (20 MG/ML) 5 ML SYRINGE
INTRAMUSCULAR | Status: AC
  Filled 2019-08-15: qty 5

## 2019-08-15 MED ORDER — ONDANSETRON HCL 4 MG/2ML IJ SOLN
INTRAMUSCULAR | Status: AC
  Filled 2019-08-15: qty 2

## 2019-08-15 MED ORDER — HYDROMORPHONE HCL 1 MG/ML IJ SOLN: 0.2500 mg | INTRAMUSCULAR | Status: DC | PRN

## 2019-08-15 MED ORDER — PROMETHAZINE HCL 25 MG/ML IJ SOLN: 6.2500 mg | INTRAMUSCULAR | Status: DC | PRN

## 2019-08-15 MED ORDER — SUCCINYLCHOLINE CHLORIDE 200 MG/10ML IV SOSY
PREFILLED_SYRINGE | INTRAVENOUS | Status: DC | PRN
  Administered 2019-08-15: 120 mg via INTRAVENOUS

## 2019-08-15 MED ORDER — LIDOCAINE 2% (20 MG/ML) 5 ML SYRINGE
INTRAMUSCULAR | Status: DC | PRN
  Administered 2019-08-15: 60 mg via INTRAVENOUS

## 2019-08-15 MED ORDER — CLINDAMYCIN PHOSPHATE 600 MG/50ML IV SOLN
INTRAVENOUS | Status: AC
  Filled 2019-08-15: qty 50

## 2019-08-15 MED ORDER — PHENYLEPHRINE 40 MCG/ML (10ML) SYRINGE FOR IV PUSH (FOR BLOOD PRESSURE SUPPORT)
PREFILLED_SYRINGE | INTRAVENOUS | Status: AC
  Filled 2019-08-15: qty 10

## 2019-08-15 MED ORDER — OXYCODONE HCL 5 MG/5ML PO SOLN: 5.0000 mg | Freq: Once | ORAL | Status: DC | PRN

## 2019-08-15 MED ORDER — LACTATED RINGERS IV SOLN
INTRAVENOUS | Status: DC
  Administered 2019-08-15 (×2): via INTRAVENOUS

## 2019-08-15 MED ORDER — EPHEDRINE 5 MG/ML INJ
INTRAVENOUS | Status: AC
  Filled 2019-08-15: qty 10

## 2019-08-15 MED ORDER — FENTANYL CITRATE (PF) 100 MCG/2ML IJ SOLN
INTRAMUSCULAR | Status: DC | PRN
  Administered 2019-08-15 (×2): 50 ug via INTRAVENOUS

## 2019-08-15 MED ORDER — OXYCODONE HCL 5 MG PO TABS: 5.0000 mg | ORAL_TABLET | Freq: Once | ORAL | Status: DC | PRN

## 2019-08-15 MED ORDER — FENTANYL CITRATE (PF) 100 MCG/2ML IJ SOLN: 50.0000 ug | INTRAMUSCULAR | Status: DC | PRN

## 2019-08-15 MED ORDER — PHENYLEPHRINE 40 MCG/ML (10ML) SYRINGE FOR IV PUSH (FOR BLOOD PRESSURE SUPPORT)
PREFILLED_SYRINGE | INTRAVENOUS | Status: DC | PRN
  Administered 2019-08-15 (×2): 80 ug via INTRAVENOUS
  Administered 2019-08-15: 120 ug via INTRAVENOUS

## 2019-08-15 MED ORDER — DEXAMETHASONE SODIUM PHOSPHATE 10 MG/ML IJ SOLN
INTRAMUSCULAR | Status: AC
  Filled 2019-08-15: qty 1

## 2019-08-15 MED ORDER — PROPOFOL 10 MG/ML IV BOLUS
INTRAVENOUS | Status: DC | PRN
  Administered 2019-08-15: 30 mg via INTRAVENOUS
  Administered 2019-08-15: 150 mg via INTRAVENOUS
  Administered 2019-08-15: 130 mg via INTRAVENOUS
  Administered 2019-08-15: 30 mg via INTRAVENOUS
  Administered 2019-08-15: 20 mg via INTRAVENOUS
  Administered 2019-08-15: 30 mg via INTRAVENOUS

## 2019-08-15 MED ORDER — PROPOFOL 10 MG/ML IV BOLUS
INTRAVENOUS | Status: AC
  Filled 2019-08-15: qty 20

## 2019-08-15 MED ORDER — FENTANYL CITRATE (PF) 100 MCG/2ML IJ SOLN
INTRAMUSCULAR | Status: AC
  Filled 2019-08-15: qty 2

## 2019-08-15 SURGICAL SUPPLY — 20 items
BRONCHOSCOPE BIFLEX 5.0 DISP (MISCELLANEOUS) ×2 IMPLANT
CANISTER SUCT 1200ML W/VALVE (MISCELLANEOUS) ×3 IMPLANT
CONT SPEC 4OZ CLIKSEAL STRL BL (MISCELLANEOUS) ×4 IMPLANT
DRAPE HALF SHEET 70X43 (DRAPES) ×3 IMPLANT
GAUZE SPONGE 4X4 12PLY STRL LF (GAUZE/BANDAGES/DRESSINGS) ×6 IMPLANT
GLOVE BIO SURGEON STRL SZ7.5 (GLOVE) ×3 IMPLANT
GLOVE BIOGEL PI IND STRL 6.5 (GLOVE) IMPLANT
GLOVE BIOGEL PI INDICATOR 6.5 (GLOVE) ×2
GOWN STRL REUS W/ TWL LRG LVL3 (GOWN DISPOSABLE) IMPLANT
GOWN STRL REUS W/TWL LRG LVL3 (GOWN DISPOSABLE)
GUARD TEETH (MISCELLANEOUS) ×4 IMPLANT
MARKER SKIN DUAL TIP RULER LAB (MISCELLANEOUS) IMPLANT
NS IRRIG 1000ML POUR BTL (IV SOLUTION) ×3 IMPLANT
SLEEVE SCD COMPRESS KNEE MED (MISCELLANEOUS) ×2 IMPLANT
SOLUTION BUTLER CLEAR DIP (MISCELLANEOUS) ×3 IMPLANT
SURGILUBE 2OZ TUBE FLIPTOP (MISCELLANEOUS) IMPLANT
SUT VIC AB 4-0 BRD 54 (SUTURE) ×2 IMPLANT
TOWEL GREEN STERILE FF (TOWEL DISPOSABLE) ×3 IMPLANT
TUBE CONNECTING 20'X1/4 (TUBING) ×2
TUBE CONNECTING 20X1/4 (TUBING) ×3 IMPLANT

## 2019-08-15 NOTE — Transfer of Care (Signed)
Immediate Anesthesia Transfer of Care Note  Patient: [name]  Procedure(s) Performed: BRONCHOSCOPY/LARYNGOSCOPY (N/A Throat)  Patient Location: PACU  Anesthesia Type:General  Level of Consciousness: drowsy  Airway & Oxygen Therapy: Patient Spontanous Breathing and Patient connected to face mask oxygen  Post-op Assessment: Report given to RN and Post -op Vital signs reviewed and stable  Post vital signs: Reviewed and stable  Last Vitals:  Vitals Value Taken Time  BP    Temp    Pulse    Resp    SpO2      Last Pain:  Vitals:   08/15/19 0752  TempSrc: Oral  PainSc: 0-No pain      Patients Stated Pain Goal: 5 ([id number])  Complications: No apparent anesthesia complications

## 2019-08-15 NOTE — Anesthesia Postprocedure Evaluation (Signed)
Anesthesia Post Note  Patient: [name]  Procedure(s) Performed: BRONCHOSCOPY/LARYNGOSCOPY (N/A Throat)     Patient location during evaluation: PACU Anesthesia Type: General Level of consciousness: awake and alert Pain management: pain level controlled Vital Signs Assessment: post-procedure vital signs reviewed and stable Respiratory status: spontaneous breathing, nonlabored ventilation and respiratory function stable Cardiovascular status: blood pressure returned to baseline and stable Postop Assessment: no apparent nausea or vomiting Anesthetic complications: no    Last Vitals:  Vitals:   08/15/19 1111 08/15/19 1122  BP:    Pulse: 78 80  Resp: 17 16  Temp:  36.7 C  SpO2: 96% 96%    Last Pain:  Vitals:   08/15/19 1122  TempSrc:   PainSc: 0-No pain                 Lynda Rainwater

## 2019-08-15 NOTE — Op Note (Signed)
DATE OF PROCEDURE:  08/15/2019                              OPERATIVE REPORT  SURGEON:  Leta Baptist, MD  PREOPERATIVE DIAGNOSES: 1. Tracheal mass  POSTOPERATIVE DIAGNOSES: 1. Normal tracheal with mucoid drainage  PROCEDURE PERFORMED:  Bronchoscopy  ANESTHESIA:  General endotracheal tube anesthesia.  COMPLICATIONS:  None.  ESTIMATED BLOOD LOSS:  None.  INDICATION FOR PROCEDURE:  [name] is a 73 y.o. female who recently underwent a spine CT to evaluate her neck pain. The CT has an incidental finding of a lobular soft tissue within the trachea at the T1 level. No adenopathy or other neck mass was noted.  Based on the above findings, the decision was made for the patient to undergo operative bronchoscopy and possible biopsy of the soft tissue mass. The risks, benefits, alternatives, and details of the procedure were discussed with the patient.  Questions were invited and answered.  Informed consent was obtained.  DESCRIPTION:  The patient was taken to the operating room and placed supine on the operating table.  General endotracheal tube anesthesia was administered by the anesthesiologist.  The patient was positioned and prepped and draped in a standard fashion for endoscopy examination.  A Dedo laryngoscope was inserted via the oral cavity into the pharynx.  Examination of the epiglottis, aryepiglottic folds, vallecula, and the glottis were all normal.  The Dedo laryngoscope was suspended with a Lewy suspender.  The endotracheal tube was removed.  A rigid endoscope was then inserted through the Dedo laryngoscope, past the vocal cords, and into the trachea.  Examination of the tracheal mucosa revealed no suspicious mass or lesion.  Only mucoid drainage was noted.  Photodocumentation of the findings were obtained.  The rigid endoscope was withdrawn.  A flexible bronchoscope was then inserted through the Dedo laryngoscope into the trachea and the bronchi.  No abnormality was noted.  The  flexible bronchoscope and the Dedo laryngoscope were removed.  The care of the patient was turned over to the anesthesiologist.  The patient was awakened from anesthesia without difficulty.  The patient was extubated and transferred to the recovery room in good condition.  OPERATIVE FINDINGS: Normal trachea with mucoid drainage.  No soft tissue mass was noted.  SPECIMEN:  None  FOLLOWUP CARE:  The patient will be discharged home once awake and alert.   [name] 08/15/2019 10:27 AM

## 2019-08-15 NOTE — Anesthesia Procedure Notes (Signed)
Procedure Name: Intubation Date/Time: 08/15/2019 9:23 AM Performed by: Gwyndolyn Saxon, CRNA Pre-anesthesia Checklist: Patient identified, Emergency Drugs available, Suction available and Patient being monitored Patient Re-evaluated:Patient Re-evaluated prior to induction Oxygen Delivery Method: Circle system utilized Preoxygenation: Pre-oxygenation with 100% oxygen Induction Type: IV induction Ventilation: Mask ventilation without difficulty Laryngoscope Size: Miller and 2 Grade View: Grade II Tube type: Oral Tube size: 7.0 mm Number of attempts: 1 Airway Equipment and Method: Stylet Placement Confirmation: ETT inserted through vocal cords under direct vision,  positive ETCO2 and breath sounds checked- equal and bilateral Secured at: 20 cm Tube secured with: Tape Dental Injury: Teeth and Oropharynx as per pre-operative assessment

## 2019-08-15 NOTE — Anesthesia Preprocedure Evaluation (Signed)
Anesthesia Evaluation  Patient identified by MRN, date of birth, ID band Patient awake    Reviewed: Allergy & Precautions, H&P , NPO status , Patient's Chart, lab work & pertinent test results  Airway Mallampati: II  TM Distance: >3 FB Neck ROM: Full    Dental  (+) Teeth Intact, Dental Advisory Given   Pulmonary COPD, Current Smoker and Patient abstained from smoking.,    breath sounds clear to auscultation       Cardiovascular + Peripheral Vascular Disease   Rhythm:Regular Rate:Normal     Neuro/Psych    GI/Hepatic Neg liver ROS, GERD  ,  Endo/Other  negative endocrine ROS  Renal/GU negative Renal ROS     Musculoskeletal   Abdominal   Peds  Hematology   Anesthesia Other Findings   Reproductive/Obstetrics                             Anesthesia Physical  Anesthesia Plan  ASA: II  Anesthesia Plan: General   Post-op Pain Management:    Induction: Intravenous  PONV Risk Score and Plan: 2 and Ondansetron, Midazolam and Treatment may vary due to age or medical condition  Airway Management Planned: Oral ETT  Additional Equipment:   Intra-op Plan:   Post-operative Plan: Extubation in OR  Informed Consent: I have reviewed the patients History and Physical, chart, labs and discussed the procedure including the risks, benefits and alternatives for the proposed anesthesia with the patient or authorized representative who has indicated his/her understanding and acceptance.     Dental advisory given  Plan Discussed with: CRNA and Anesthesiologist  Anesthesia Plan Comments:         Anesthesia Quick Evaluation

## 2019-08-15 NOTE — Discharge Instructions (Addendum)
Call your surgeon if you experience:   1.  Fever over 101.0. 2.  Inability to urinate. 3.  Nausea and/or vomiting. 4.  Extreme swelling or bruising at the surgical site. 5.  Continued bleeding from the incision. 6.  Increased pain, redness or drainage from the incision. 7.  Problems related to your pain medication. 8.  Any problems and/or concernsThe patient may resume all her previous activities and diet.  No postop restriction.

## 2019-08-15 NOTE — H&P (Signed)
Cc: Neck mass  HPI: The patient is a 73 y/o female who presents today for evaluation of possible neck mass. The patient is seen in consultation requested by [name], FNP. The patient has recently been experiencing neck and throat discomfort. She underwent a C-spine CT which showed a lobular soft tissue mass in the right trachea that was noted to be contiguous with the right thyroid gland but did not result in airway obstruction. The patient denies dysphagia or odynophagia. She has not noted any difficulty breathing. The patient has been smoking for over 50 years. She is currently smoking 5-7 cigarettes a day. Pervious ENT surgery is denied.   The patient's review of systems (constitutional, eyes, ENT, cardiovascular, respiratory, GI, musculoskeletal, skin, neurologic, psychiatric, endocrine, hematologic, allergic) is noted in the ROS questionnaire.  It is reviewed with the patient.   Family health history: Heart disease.  Major events: Gallbladder removed, hysterectomy,.  Ongoing medical problems: Not recorded.  Social history: The patient is single. She smokes 5-7 cigarettes a day. She denies the use of alcohol or illegal drugs.   Exam: General: Communicates without difficulty, well nourished, no acute distress. Head: Normocephalic, no evidence injury, no tenderness, facial buttresses intact without stepoff. Eyes: PERRL, EOMI. No scleral icterus, conjunctivae clear. Neuro: CN II exam reveals vision grossly intact.  No nystagmus at any point of gaze. Ears: Auricles well formed without lesions.  Ear canals are intact without mass or lesion.  No erythema or edema is appreciated.  The TMs are intact without fluid. Nose: External evaluation reveals normal support and skin without lesions.  Dorsum is intact.  Anterior rhinoscopy reveals healthy pink mucosa over anterior aspect of inferior turbinates and intact septum.  No purulence noted. Oral:  Oral cavity and oropharynx are intact, symmetric, without  erythema or edema.  Mucosa is moist without lesions. Neck: Full range of motion without pain.  There is no significant lymphadenopathy.  No masses palpable.  Thyroid bed within normal limits to palpation.  Parotid glands and submandibular glands equal bilaterally without mass.  Trachea is midline. Neuro:  CN 2-12 grossly intact. Gait normal. Vestibular: No nystagmus at any point of gaze. The cerebellar examination is unremarkable.   Procedure:  Flexible Fiberoptic Laryngoscopy -- Risks, benefits, and alternatives of flexible endoscopy were explained to the patient.  Specific mention was made of the risk of throat numbness with difficulty swallowing, possible bleeding from the nose and mouth, and pain from the procedure.  The patient gave oral consent to proceed.  The flexible scope was inserted into the right nasal cavity and advanced towards the nasopharynx.  Visualized mucosa over the turbinates and septum were as described above.  The nasopharynx was clear.  Oropharyngeal walls were symmetric and mobile without lesion, mass, or edema.  Hypopharynx was also without  lesion or edema.  Larynx was mobile without lesions. Supraglottic structures were free of edema, mass, and asymmetry.  True vocal folds were white without mass or lesion.  Base of tongue was within normal limits.  The patient tolerated the procedure well.   Assessment 1. The patient is noted to have a normal ENT and laryngoscopy evaluation today.  2. Cervical spine CT showed lobular soft tissue extruding into the trachea that was contiguous with the right thyroid gland but did not result in airway obstruction.   Plan  1. The physical exam, laryngoscopy, and CT findings are reviewed with the patient.  2. Recommend endoscopy and biopsy of the tracheal mass in the OR.

## 2019-08-16 ENCOUNTER — Encounter (HOSPITAL_BASED_OUTPATIENT_CLINIC_OR_DEPARTMENT_OTHER): Payer: Self-pay | Admitting: Otolaryngology

## 2019-08-30 ENCOUNTER — Other Ambulatory Visit: Payer: Self-pay | Admitting: Otolaryngology

## 2019-08-30 DIAGNOSIS — J398 Other specified diseases of upper respiratory tract: Secondary | ICD-10-CM

## 2019-09-07 ENCOUNTER — Other Ambulatory Visit: Payer: Self-pay

## 2019-09-07 ENCOUNTER — Ambulatory Visit
Admission: RE | Admit: 2019-09-07 | Discharge: 2019-09-07 | Disposition: A | Discharge status: Home / Self Care | Payer: Medicare Other | Source: Ambulatory Visit | Attending: Otolaryngology | Admitting: Otolaryngology

## 2019-09-07 ENCOUNTER — Other Ambulatory Visit: Payer: Medicare Other

## 2019-09-07 DIAGNOSIS — J398 Other specified diseases of upper respiratory tract: Secondary | ICD-10-CM

## 2019-09-07 MED ORDER — IOPAMIDOL (ISOVUE-300) INJECTION 61%
75.0000 mL | Freq: Once | INTRAVENOUS | Status: AC | PRN
  Administered 2019-09-07: 75 mL via INTRAVENOUS

## 2019-09-13 ENCOUNTER — Other Ambulatory Visit: Payer: Self-pay

## 2019-09-14 ENCOUNTER — Encounter: Payer: Medicare Other | Admitting: Family Medicine

## 2019-09-14 ENCOUNTER — Ambulatory Visit: Payer: Medicare Other | Admitting: Family Medicine

## 2019-10-05 DIAGNOSIS — M47817 Spondylosis without myelopathy or radiculopathy, lumbosacral region: Secondary | ICD-10-CM | POA: Diagnosis not present

## 2019-10-05 DIAGNOSIS — R030 Elevated blood-pressure reading, without diagnosis of hypertension: Secondary | ICD-10-CM | POA: Diagnosis not present

## 2019-10-10 ENCOUNTER — Other Ambulatory Visit: Payer: Self-pay

## 2019-10-11 ENCOUNTER — Encounter: Payer: Self-pay | Admitting: Family Medicine

## 2019-10-11 ENCOUNTER — Ambulatory Visit (INDEPENDENT_AMBULATORY_CARE_PROVIDER_SITE_OTHER): Payer: Medicare Other | Admitting: Family Medicine

## 2019-10-11 VITALS — BP 126/79 | HR 80 | Temp 96.2°F | Ht 66.0 in | Wt 127.2 lb

## 2019-10-11 DIAGNOSIS — I714 Abdominal aortic aneurysm, without rupture, unspecified: Secondary | ICD-10-CM

## 2019-10-11 DIAGNOSIS — F17200 Nicotine dependence, unspecified, uncomplicated: Secondary | ICD-10-CM

## 2019-10-11 DIAGNOSIS — M5136 Other intervertebral disc degeneration, lumbar region: Secondary | ICD-10-CM

## 2019-10-11 DIAGNOSIS — Z1159 Encounter for screening for other viral diseases: Secondary | ICD-10-CM

## 2019-10-11 DIAGNOSIS — I83893 Varicose veins of bilateral lower extremities with other complications: Secondary | ICD-10-CM | POA: Diagnosis not present

## 2019-10-11 DIAGNOSIS — Z0000 Encounter for general adult medical examination without abnormal findings: Secondary | ICD-10-CM

## 2019-10-11 DIAGNOSIS — E785 Hyperlipidemia, unspecified: Secondary | ICD-10-CM | POA: Diagnosis not present

## 2019-10-11 DIAGNOSIS — E559 Vitamin D deficiency, unspecified: Secondary | ICD-10-CM

## 2019-10-11 NOTE — Progress Notes (Signed)
Assessment & Plan:  1. Well adult exam - Preventive health education provided. Patient will bring copies of her advanced directives the next time she comes in for [place] to scan to her chart. Patient declined lung cancer screening CT scan. She will schedule mamm[facility] with [facility] [place]. She is UTD with Shingrix, PNA, TDAP, influenza, DEXA, and colonoscopy.  - CBC with Differential/Platelet - CMP14+EGFR - Lipid Panel  2. Varicose veins of bilateral lower extremities with other complications - Managed by Dr. Carlis Abbott with [facility].   3. AAA (abdominal aortic aneurysm) without rupture ([place]) - Managed by Dr. Carlis Abbott with [facility].  4. Lumbar degenerative disc disease - Managed by Dr. Maryjean Ka; he prescribes Norco.   5. Smoker unmotivated to quit - Patient has cut down to 5-6 cigarettes per day.   6. Vitamin D deficiency - Does not wish to have level drawn as it is always low and she is unable to tolerate vitamin D supplements.   7. Encounter for hepatitis C screening test for low risk patient - HCV Antibody RFX to Quant PCR   Follow-up: Return in about 6 months (around 04/09/2020) for follow-up of chronic medication conditions.   [name], MSN, APRN, FNP-C [facility]  Subjective:  Patient ID: [name], female    DOB: 1946-04-03  Age: 74 y.o. MRN: [id number]  Patient Care Team: [name], FNP as PCP - General (Family Medicine) Leeroy Cha, MD as Consulting Physician (Neurosurgery) Clydell Hakim, MD as Consulting Physician (Pain Medicine) Richmond Campbell, MD as Consulting Physician (Gastroenterology) [facility] (Vascular Surgery)   CC:  Chief Complaint  Patient presents with  . Annual Exam    HPI [name] presents for her annual physical.   Occupation: retired, Marital status: divorced, Substance use: none Diet: healthy, Exercise: walking  daily Last eye exam: 2 years ago Last dental exam: Sept 2020; next appt in Feb 2021 Last colonoscopy: 09/29/2010 Last mammogram: 12/24/2016 DEXA: 04/17/2017 - osteopenia; unable to tolerate vitamin D supplementation. Lung Cancer Screening with low-dose Chest CT: declined Hepatitis C Screening: ordered today Immunizations: Flu Vaccine: received 06/13/2019  Tdap Vaccine: UTD; received 01/31/2013  Shingrix Vaccine: completed  Pneumonia Vaccine: completed  Advanced Directives Patient does have advanced directives including living will, healthcare power of attorney and financial power of attorney. She does not have a copy in the electronic medical record.   DEPRESSION SCREENING PHQ 2/9 Scores 10/11/2019 07/28/2019 06/15/2019 05/10/2014  PHQ - 2 Score 0 0 0 0  PHQ- 9 Score - 0 0 -     Review of Systems  Constitutional: Negative for chills, fever, malaise/fatigue and weight loss.  HENT: Negative for congestion, ear discharge, ear pain, nosebleeds, sinus pain, sore throat and tinnitus.   Eyes: Negative for blurred vision, double vision, pain, discharge and redness.  Respiratory: Negative for cough, shortness of breath and wheezing.   Cardiovascular: Negative for chest pain, palpitations and leg swelling.  Gastrointestinal: Negative for abdominal pain, constipation, diarrhea, heartburn, nausea and vomiting.  Genitourinary: Negative for dysuria, frequency and urgency.  Musculoskeletal: Negative for myalgias.  Skin: Negative for rash.  Neurological: Negative for dizziness, seizures, weakness and headaches.  Psychiatric/Behavioral: Negative for depression, substance abuse and suicidal ideas. The patient is not nervous/anxious.      Current Outpatient Medications:  .  cetirizine (ZYRTEC) 10 MG tablet, Take 10 mg by mouth daily., Disp: , Rfl:  .  HYDROcodone-acetaminophen (NORCO) 10-325  MG tablet, Take 1 tablet by mouth 3 (three) times daily as needed., Disp: , Rfl:   Allergies  Allergen Reactions   . Bee Venom     Other reaction(s): Redness  . Cephalexin Other (See Comments)    Headaches  . Gabapentin Diarrhea    Past Medical History:  Diagnosis Date  . AAA (abdominal aortic aneurysm) ([place])    saw Dr Kellie Simmering 11/2013.  2.7 cm  . Arthritis   . Chicken pox   . COPD (chronic obstructive pulmonary disease) ([place])    smokes 1/2ppd  . DDD (degenerative disc disease), lumbar   . GERD (gastroesophageal reflux disease)    diet controlled  . History of blood transfusion   . History of lumbar fusion 08/09/2014  . Hyperlipidemia   . IBS (irritable bowel syndrome)    diet contolled  . Mild mitral regurgitation   . RBBB   . S/P arthroscopy of right shoulder 12/15/2017  . Seasonal allergies   . Varicose veins     Past Surgical History:  Procedure Laterality Date  . APPENDECTOMY    . BACK SURGERY N/A [phone]   5 back surgeries  . BREAST BIOPSY  1989,05   lt br bx  . BREAST EXCISIONAL BIOPSY Bilateral   . CATARACT EXTRACTION W/ INTRAOCULAR LENS IMPLANT Bilateral 2018  . CHOLECYSTECTOMY    . EXCISION/RELEASE BURSA HIP Right 05/12/2013   Procedure: EXCISION RIGHT TROCHANTERIC BURSA/CALCIFICATION ;  Surgeon: Ninetta Lights, MD;  Location: [place];  Service: Orthopedics;  Laterality: Right;  . FASCIOTOMY Right 05/12/2013   Procedure: RIGHT FASCIOTOMY HIP ANY TYPE;  Surgeon: Ninetta Lights, MD;  Location: [place];  Service: Orthopedics;  Laterality: Right;  . LARYNGOSCOPY AND BRONCHOSCOPY N/A 08/15/2019   Procedure: BRONCHOSCOPY/LARYNGOSCOPY;  Surgeon: Leta Baptist, MD;  Location: [place];  Service: ENT;  Laterality: N/A;  . NM MYOCAR PERF WALL MOTION  04/13/2003   negative  . SHOULDER ARTHROSCOPY WITH OPEN ROTATOR CUFF REPAIR Right 2018  . TONSILLECTOMY AND ADENOIDECTOMY  1950  . TOTAL HIP ARTHROPLASTY Left 2006   lt total hip  . TOTAL HIP ARTHROPLASTY Right 2011  . TOTAL HIP ARTHROPLASTY Right 2016   second surgery due to recall   . US ECHOCARDIOGRAPHY  04/13/2003   EF 65%, mild TR w/suggestion of mild pulmonary hypertension,thickened anterior leaflet,central mild MR.  Marland Kitchen VAGINAL HYSTERECTOMY  1993    Family History  Problem Relation Age of Onset  . Early death Mother        aortic aneurysm  . Vasculitis Mother        temporal arteritis  . Aortic aneurysm Mother   . Heart disease Mother        before age 37  . Hyperlipidemia Mother   . Hypertension Mother   . COPD Sister   . Hypertension Sister   . Depression Sister   . Heart disease Sister        before age 56  . Post-traumatic stress disorder Sister   . Bipolar disorder Sister   . Brain cancer Maternal Aunt 84       brain  . Heart disease Maternal Grandmother        aortic aneurysm  . Aortic aneurysm Maternal Grandmother   . Hypertension Sister   . Throat cancer Maternal Grandfather   . Breast cancer Neg Hx     Social History   Socioeconomic History  . Marital status: Legally Separated    Spouse  name: Not on file  . Number of children: Not on file  . Years of education: Not on file  . Highest education level: Not on file  Occupational History  . Not on file  Tobacco Use  . Smoking status: Current Every Day Smoker    Packs/day: 0.50    Years: 30.00    Pack years: 15.00    Types: Cigarettes  . Smokeless tobacco: Never Used  Substance and Sexual Activity  . Alcohol use: Not Currently    Alcohol/week: 0.0 standard drinks    Comment: very occasional, not monthly  . Drug use: No  . Sexual activity: Not Currently  Other Topics Concern  . Not on file  Social History Narrative  . Not on file   Social Determinants of Health   Financial Resource Strain:   . Difficulty of Paying Living Expenses: Not on file  Food Insecurity:   . Worried About Charity fundraiser in the Last Year: Not on file  . Ran Out of Food in the Last Year: Not on file  Transportation Needs:   . Lack of Transportation (Medical): Not on file  . Lack of  Transportation (Non-Medical): Not on file  Physical Activity:   . Days of Exercise per Week: Not on file  . Minutes of Exercise per Session: Not on file  Stress:   . Feeling of Stress : Not on file  Social Connections:   . Frequency of Communication with Friends and Family: Not on file  . Frequency of Social Gatherings with Friends and Family: Not on file  . Attends Religious Services: Not on file  . Active Member of Clubs or Organizations: Not on file  . Attends Archivist Meetings: Not on file  . Marital Status: Not on file  Intimate Partner Violence:   . Fear of Current or Ex-Partner: Not on file  . Emotionally Abused: Not on file  . Physically Abused: Not on file  . Sexually Abused: Not on file      Objective:    BP 126/79   Pulse 80   Temp (!) 96.2 F (35.7 C) (Temporal)   Ht _0  (1.676 m)   Wt 127 lb 3.2 oz (57.7 kg)   SpO2 97%   BMI 20.53 kg/m   Wt Readings from Last 3 Encounters:  10/11/19 127 lb 3.2 oz (57.7 kg)  08/15/19 123 lb 14.4 oz (56.2 kg)  07/28/19 122 lb (55.3 kg)    Physical Exam Vitals reviewed.  Constitutional:      General: She is not in acute distress.    Appearance: Normal appearance. She is normal weight. She is not ill-appearing, toxic-appearing or diaphoretic.  HENT:     Head: Normocephalic and atraumatic.     Right Ear: Tympanic membrane, ear canal and external ear normal. There is no impacted cerumen.     Left Ear: Tympanic membrane, ear canal and external ear normal. There is no impacted cerumen.     Nose: Nose normal. No congestion or rhinorrhea.     Mouth/Throat:     Mouth: Mucous membranes are moist.     Pharynx: Oropharynx is clear. No oropharyngeal exudate or posterior oropharyngeal erythema.  Eyes:     General: No scleral icterus.       Right eye: No discharge.        Left eye: No discharge.     Conjunctiva/sclera: Conjunctivae normal.     Pupils: Pupils are equal, round, and reactive to light.  Cardiovascular:      Rate and Rhythm: Normal rate and regular rhythm.     Heart sounds: Normal heart sounds. No murmur. No friction rub. No gallop.   Pulmonary:     Effort: Pulmonary effort is normal. No respiratory distress.     Breath sounds: No stridor. Examination of the right-upper field reveals wheezing. Wheezing present. No rhonchi or rales.  Abdominal:     General: Abdomen is flat. Bowel sounds are normal. There is no distension.     Palpations: Abdomen is soft. There is no mass.     Tenderness: There is no abdominal tenderness. There is no guarding or rebound.     Hernia: No hernia is present.  Musculoskeletal:        General: Normal range of motion.     Cervical back: Normal range of motion and neck supple. No rigidity. No muscular tenderness.  Lymphadenopathy:     Cervical: No cervical adenopathy.  Skin:    General: Skin is warm and dry.     Capillary Refill: Capillary refill takes less than 2 seconds.  Neurological:     General: No focal deficit present.     Mental Status: She is alert and oriented to person, place, and time. Mental status is at baseline.  Psychiatric:        Mood and Affect: Mood normal.        Behavior: Behavior normal.        Thought Content: Thought content normal.        Judgment: Judgment normal.     Lab Results  Component Value Date   TSH 0.80 05/10/2014   Lab Results  Component Value Date   WBC 8.7 05/10/2014   HGB 15.5 (H) 05/10/2014   HCT 45.9 05/10/2014   MCV 93.7 05/10/2014   PLT 226.0 05/10/2014   Lab Results  Component Value Date   NA 137 05/10/2014   K 4.1 05/10/2014   CO2 29 05/10/2014   GLUCOSE 78 05/10/2014   BUN 7 05/10/2014   CREATININE 0.8 05/10/2014   BILITOT 0.6 05/10/2014   ALKPHOS 74 05/10/2014   AST 24 05/10/2014   ALT 12 05/10/2014   PROT 6.8 05/10/2014   ALBUMIN 4.1 05/10/2014   CALCIUM 9.5 05/10/2014   GFR 81.24 05/10/2014   Lab Results  Component Value Date   CHOL 209 (H) 05/10/2014   Lab Results  Component Value  Date   HDL 51.70 05/10/2014   Lab Results  Component Value Date   LDLCALC 130 (H) 05/10/2014   Lab Results  Component Value Date   TRIG 137.0 05/10/2014   Lab Results  Component Value Date   CHOLHDL 4 05/10/2014   No results found for: HGBA1C

## 2019-10-11 NOTE — Patient Instructions (Addendum)
Call to schedule your mammogram.    Preventive Care 65 Years and Older, Female Preventive care refers to lifestyle choices and visits with your health care provider that can promote health and wellness. This includes:  A yearly physical exam. This is also called an annual well check.  Regular dental and eye exams.  Immunizations.  Screening for certain conditions.  Healthy lifestyle choices, such as diet and exercise. What can I expect for my preventive care visit? Physical exam Your health care provider will check:  Height and weight. These may be used to calculate body mass index (BMI), which is a measurement that tells if you are at a healthy weight.  Heart rate and blood pressure.  Your skin for abnormal spots. Counseling Your health care provider may ask you questions about:  Alcohol, tobacco, and drug use.  Emotional well-being.  Home and relationship well-being.  Sexual activity.  Eating habits.  History of falls.  Memory and ability to understand (cognition).  Work and work Statistician.  Pregnancy and menstrual history. What immunizations do I need?  Influenza (flu) vaccine  This is recommended every year. Tetanus, diphtheria, and pertussis (Tdap) vaccine  You may need a Td booster every 10 years. Varicella (chickenpox) vaccine  You may need this vaccine if you have not already been vaccinated. Zoster (shingles) vaccine  You may need this after age 40. Pneumococcal conjugate (PCV13) vaccine  One dose is recommended after age 47. Pneumococcal polysaccharide (PPSV23) vaccine  One dose is recommended after age 53. Measles, mumps, and rubella (MMR) vaccine  You may need at least one dose of MMR if you were born in 1957 or later. You may also need a second dose. Meningococcal conjugate (MenACWY) vaccine  You may need this if you have certain conditions. Hepatitis A vaccine  You may need this if you have certain conditions or if you travel or  work in places where you may be exposed to hepatitis A. Hepatitis B vaccine  You may need this if you have certain conditions or if you travel or work in places where you may be exposed to hepatitis B. Haemophilus influenzae type b (Hib) vaccine  You may need this if you have certain conditions. You may receive vaccines as individual doses or as more than one vaccine together in one shot (combination vaccines). Talk with your health care provider about the risks and benefits of combination vaccines. What tests do I need? Blood tests  Lipid and cholesterol levels. These may be checked every 5 years, or more frequently depending on your overall health.  Hepatitis C test.  Hepatitis B test. Screening  Lung cancer screening. You may have this screening every year starting at age 72 if you have a 30-pack-year history of smoking and currently smoke or have quit within the past 15 years.  Colorectal cancer screening. All adults should have this screening starting at age 24 and continuing until age 108. Your health care provider may recommend screening at age 71 if you are at increased risk. You will have tests every 1-10 years, depending on your results and the type of screening test.  Diabetes screening. This is done by checking your blood sugar (glucose) after you have not eaten for a while (fasting). You may have this done every 1-3 years.  Mammogram. This may be done every 1-2 years. Talk with your health care provider about how often you should have regular mammograms.  BRCA-related cancer screening. This may be done if you have a family  history of breast, ovarian, tubal, or peritoneal cancers. Other tests  Sexually transmitted disease (STD) testing.  Bone density scan. This is done to screen for osteoporosis. You may have this done starting at age 53. Follow these instructions at home: Eating and drinking  Eat a diet that includes fresh fruits and vegetables, whole grains, lean  protein, and low-fat dairy products. Limit your intake of foods with high amounts of sugar, saturated fats, and salt.  Take vitamin and mineral supplements as recommended by your health care provider.  Do not drink alcohol if your health care provider tells you not to drink.  If you drink alcohol: ? Limit how much you have to 0-1 drink a day. ? Be aware of how much alcohol is in your drink. In the [place] one drink equals one 12 oz bottle of beer (355 mL), one 5 oz glass of wine (148 mL), or one 1 oz glass of hard liquor (44 mL). Lifestyle  Take daily care of your teeth and gums.  Stay active. Exercise for at least 30 minutes on 5 or more days each week.  Do not use any products that contain nicotine or tobacco, such as cigarettes, e-cigarettes, and chewing tobacco. If you need help quitting, ask your health care provider.  If you are sexually active, practice safe sex. Use a condom or other form of protection in order to prevent STIs (sexually transmitted infections).  Talk with your health care provider about taking a low-dose aspirin or statin. What's next?  Go to your health care provider once a year for a well check visit.  Ask your health care provider how often you should have your eyes and teeth checked.  Stay up to date on all vaccines. This information is not intended to replace advice given to you by your health care provider. Make sure you discuss any questions you have with your health care provider. Document Revised: 09/09/2018 Document Reviewed: 09/09/2018 Elsevier Patient Education  2020 [employer].

## 2019-10-12 ENCOUNTER — Other Ambulatory Visit: Payer: Self-pay | Admitting: Family Medicine

## 2019-10-12 DIAGNOSIS — E782 Mixed hyperlipidemia: Secondary | ICD-10-CM

## 2019-10-12 DIAGNOSIS — E785 Hyperlipidemia, unspecified: Secondary | ICD-10-CM | POA: Insufficient documentation

## 2019-10-12 LAB — CMP14+EGFR
ALT: 10 IU/L (ref 0–32)
AST: 20 IU/L (ref 0–40)
Albumin/Globulin Ratio: 2 (ref 1.2–2.2)
Albumin: 4.3 g/dL (ref 3.7–4.7)
Alkaline Phosphatase: 71 IU/L (ref 39–117)
BUN/Creatinine Ratio: 15 (ref 12–28)
BUN: 9 mg/dL (ref 8–27)
Bilirubin Total: 0.4 mg/dL (ref 0.0–1.2)
CO2: 24 mmol/L (ref 20–29)
Calcium: 9 mg/dL (ref 8.7–10.3)
Chloride: 100 mmol/L (ref 96–106)
Creatinine, Ser: 0.6 mg/dL (ref 0.57–1.00)
GFR calc Af Amer: 103 mL/min/{1.73_m2} (ref >59)
GFR calc non Af Amer: 89 mL/min/{1.73_m2} (ref >59)
Globulin, Total: 2.2 g/dL (ref 1.5–4.5)
Glucose: 83 mg/dL (ref 65–99)
Potassium: 4.5 mmol/L (ref 3.5–5.2)
Sodium: 138 mmol/L (ref 134–144)
Total Protein: 6.5 g/dL (ref 6.0–8.5)

## 2019-10-12 LAB — CBC WITH DIFFERENTIAL/PLATELET
Basophils Absolute: 0.1 10*3/uL (ref 0.0–0.2)
Basos: 1 %
EOS (ABSOLUTE): 0.2 10*3/uL (ref 0.0–0.4)
Eos: 3 %
Hematocrit: 42.6 % (ref 34.0–46.6)
Hemoglobin: 14.7 g/dL (ref 11.1–15.9)
Immature Grans (Abs): 0 10*3/uL (ref 0.0–0.1)
Immature Granulocytes: 0 %
Lymphocytes Absolute: 1.8 10*3/uL (ref 0.7–3.1)
Lymphs: 23 %
MCH: 33.6 pg — ABNORMAL HIGH (ref 26.6–33.0)
MCHC: 34.5 g/dL (ref 31.5–35.7)
MCV: 97 fL (ref 79–97)
Monocytes Absolute: 0.9 10*3/uL (ref 0.1–0.9)
Monocytes: 12 %
Neutrophils Absolute: 4.8 10*3/uL (ref 1.4–7.0)
Neutrophils: 61 %
Platelets: 232 10*3/uL (ref 150–450)
RBC: 4.38 x10E6/uL (ref 3.77–5.28)
RDW: 13.2 % (ref 11.7–15.4)
WBC: 7.9 10*3/uL (ref 3.4–10.8)

## 2019-10-12 LAB — LIPID PANEL
Chol/HDL Ratio: 3.6 ratio (ref 0.0–4.4)
Cholesterol, Total: 220 mg/dL — ABNORMAL HIGH (ref 100–199)
HDL: 61 mg/dL (ref >39)
LDL Chol Calc (NIH): 139 mg/dL — ABNORMAL HIGH (ref 0–99)
Triglycerides: 113 mg/dL (ref 0–149)
VLDL Cholesterol Cal: 20 mg/dL (ref 5–40)

## 2019-10-12 LAB — HEPATITIS C ANTIBODY: Hep C Virus Ab: <0.1 s/co ratio (ref 0.0–0.9)

## 2019-10-12 MED ORDER — ATORVASTATIN CALCIUM 10 MG PO TABS: 10.0000 mg | ORAL_TABLET | Freq: Every day | ORAL | 2 refills | Status: DC

## 2019-10-18 ENCOUNTER — Other Ambulatory Visit: Payer: Self-pay | Admitting: *Deleted

## 2019-10-18 DIAGNOSIS — E782 Mixed hyperlipidemia: Secondary | ICD-10-CM

## 2019-10-18 MED ORDER — ATORVASTATIN CALCIUM 10 MG PO TABS: 10.0000 mg | ORAL_TABLET | Freq: Every day | ORAL | 0 refills | Status: DC

## 2019-10-19 ENCOUNTER — Telehealth: Payer: Self-pay | Admitting: Family Medicine

## 2019-10-19 NOTE — Telephone Encounter (Signed)
Please call patient and see if she is agreeable to a mammogram. If so, please assist in getting her scheduled.

## 2019-10-20 DIAGNOSIS — M47817 Spondylosis without myelopathy or radiculopathy, lumbosacral region: Secondary | ICD-10-CM | POA: Diagnosis not present

## 2019-10-21 NOTE — Telephone Encounter (Signed)
Spoke to patient and she states that she had one last year through [facility]

## 2019-10-23 NOTE — Telephone Encounter (Signed)
Health maintenance updated

## 2019-10-28 ENCOUNTER — Other Ambulatory Visit: Payer: Medicare Other

## 2019-11-24 DIAGNOSIS — M47817 Spondylosis without myelopathy or radiculopathy, lumbosacral region: Secondary | ICD-10-CM | POA: Diagnosis not present

## 2020-01-09 DIAGNOSIS — M47817 Spondylosis without myelopathy or radiculopathy, lumbosacral region: Secondary | ICD-10-CM | POA: Diagnosis not present

## 2020-01-16 ENCOUNTER — Ambulatory Visit (INDEPENDENT_AMBULATORY_CARE_PROVIDER_SITE_OTHER): Payer: Medicare Other | Admitting: Family Medicine

## 2020-01-16 DIAGNOSIS — G43009 Migraine without aura, not intractable, without status migrainosus: Secondary | ICD-10-CM

## 2020-01-16 MED ORDER — SUMATRIPTAN SUCCINATE 50 MG PO TABS: 50.0000 mg | ORAL_TABLET | ORAL | 0 refills | Status: DC | PRN

## 2020-01-16 MED ORDER — ONDANSETRON 4 MG PO TBDP: 4.0000 mg | ORAL_TABLET | Freq: Three times a day (TID) | ORAL | 0 refills | Status: DC | PRN

## 2020-01-16 NOTE — Progress Notes (Signed)
Telephone visit  Subjective: CC: headache PCP: [name], FNP OR:[id number] B Amaral is a 74 y.o. female calls for telephone consult today. Patient provides verbal consent for consult held via phone.  Due to COVID-19 pandemic this visit was conducted virtually. This visit type was conducted due to national recommendations for restrictions regarding the COVID-19 Pandemic (e.g. social distancing, sheltering in place) in an effort to limit this patient's exposure and mitigate transmission in our community. All issues noted in this document were discussed and addressed.  A physical exam was not performed with this format.   Location of patient: home Location of provider: [facility] Others present for call: none  1. Headache Patient reports history of migraine headaches when she was younger.  When she went through menopause, they resolved (late 63's).  They started again in the last few weeks.  She reports associated nausea and vomiting during migraine headaches.  She notes that her 45.76 year old shihzu had to be put down.  She reports high amounts of stress over the last week.  Denies any unilateral weakness, sensation changes.  No dizziness, balance issues.  No dysarthria.  No visual disturbance.  Medical history significant for right bundle blanch block, mild mitral regurgitation and stable abdominal aortic aneurysm.  ROS: Per HPI  Allergies  Allergen Reactions  . Bee Venom     Other reaction(s): Redness  . Cephalexin Other (See Comments)    Headaches  . Gabapentin Diarrhea   Past Medical History:  Diagnosis Date  . AAA (abdominal aortic aneurysm) ([place])    saw Dr Kellie Simmering 11/2013.  2.7 cm  . Arthritis   . Chicken pox   . COPD (chronic obstructive pulmonary disease) ([place])    smokes 1/2ppd  . DDD (degenerative disc disease), lumbar   . GERD (gastroesophageal reflux disease)    diet controlled  . History of blood transfusion   . History of lumbar fusion 08/09/2014  . Hyperlipidemia    . IBS (irritable bowel syndrome)    diet contolled  . Mild mitral regurgitation   . RBBB   . S/P arthroscopy of right shoulder 12/15/2017  . Seasonal allergies   . Varicose veins     Current Outpatient Medications:  .  atorvastatin (LIPITOR) 10 MG tablet, Take 1 tablet (10 mg total) by mouth daily., Disp: 90 tablet, Rfl: 0 .  cetirizine (ZYRTEC) 10 MG tablet, Take 10 mg by mouth daily., Disp: , Rfl:  .  HYDROcodone-acetaminophen (NORCO) 10-325 MG tablet, Take 1 tablet by mouth 3 (three) times daily as needed., Disp: , Rfl:   Assessment/ Plan: 74 y.o. female   1. Migraine without aura and without status migrainosus, not intractable Likely precipitated by stress over the loss of her dog.  I reinforced hydration, adequate rest.  I have given her a small quantity of Imitrex to have on hand if she needs it going forward as well as Zofran.  I did review the risk of Imitrex and CVA.  We discussed consideration for eval by neurology.  She would like to defer this for now.  She understands red flag signs and symptoms warranting further evaluation emergency department.  Will CC to PCP for ongoing follow-up.6 - SUMAtriptan (IMITREX) 50 MG tablet; Take 1 tablet (50 mg total) by mouth every 2 (two) hours as needed for migraine. May repeat in 2 hours if headache persists or recurs.  Dispense: 10 tablet; Refill: 0 - ondansetron (ZOFRAN ODT) 4 MG disintegrating tablet; Take 1 tablet (4 mg total) by  mouth every 8 (eight) hours as needed for nausea or vomiting.  Dispense: 20 tablet; Refill: 0  Start time: 9:11am End time: 9:21am  Total time spent on patient care (including telephone call/ virtual visit): 20 minutes  [place], [place] [phone]

## 2020-01-16 NOTE — Patient Instructions (Signed)

## 2020-01-31 ENCOUNTER — Other Ambulatory Visit: Payer: Self-pay | Admitting: Family Medicine

## 2020-01-31 DIAGNOSIS — E782 Mixed hyperlipidemia: Secondary | ICD-10-CM

## 2020-02-14 ENCOUNTER — Encounter: Payer: Self-pay | Admitting: Family Medicine

## 2020-02-14 ENCOUNTER — Ambulatory Visit (INDEPENDENT_AMBULATORY_CARE_PROVIDER_SITE_OTHER): Payer: Medicare Other | Admitting: Family Medicine

## 2020-02-14 DIAGNOSIS — J302 Other seasonal allergic rhinitis: Secondary | ICD-10-CM | POA: Diagnosis not present

## 2020-02-14 MED ORDER — MONTELUKAST SODIUM 10 MG PO TABS: 10.0000 mg | ORAL_TABLET | Freq: Every day | ORAL | 3 refills | Status: DC

## 2020-02-14 NOTE — Progress Notes (Signed)
Virtual Visit via Telephone Note  I connected with [name] on 02/14/20 at 1:44 PM by telephone and verified that I am speaking with the correct person using two identifiers. [name] is currently located at home and nobody is currently with her during this visit. The provider, [name], FNP is located in their home at time of visit.  I discussed the limitations, risks, security and privacy concerns of performing an evaluation and management service by telephone and the availability of in person appointments. I also discussed with the patient that there may be a patient responsible charge related to this service. The patient expressed understanding and agreed to proceed.  Subjective: PCP: [name], FNP  Chief Complaint  Patient presents with  . URI   Patient complains of head/chest congestion, runny nose, postnasal drainage and itchy watery eyes. Onset of symptoms was a few weeks ago. She is drinking plenty of fluids. Evaluation to date: she has been to her eye doctor who told her it was allergies. Treatment to date: antihistamines, nasal steroids and Pataday. She has a history of COPD. She does smoke.    ROS: Per HPI  Current Outpatient Medications:  .  atorvastatin (LIPITOR) 10 MG tablet, TAKE 1 TABLET DAILY, Disp: 90 tablet, Rfl: 0 .  cetirizine (ZYRTEC) 10 MG tablet, Take 10 mg by mouth daily., Disp: , Rfl:  .  HYDROcodone-acetaminophen (NORCO) 10-325 MG tablet, Take 1 tablet by mouth 3 (three) times daily as needed., Disp: , Rfl:  .  ondansetron (ZOFRAN ODT) 4 MG disintegrating tablet, Take 1 tablet (4 mg total) by mouth every 8 (eight) hours as needed for nausea or vomiting., Disp: 20 tablet, Rfl: 0 .  SUMAtriptan (IMITREX) 50 MG tablet, Take 1 tablet (50 mg total) by mouth every 2 (two) hours as needed for migraine. May repeat in 2 hours if headache persists or recurs., Disp: 10 tablet, Rfl: 0  Allergies  Allergen Reactions  . Bee Venom     Other  reaction(s): Redness  . Cephalexin Other (See Comments)    Headaches  . Gabapentin Diarrhea   Past Medical History:  Diagnosis Date  . AAA (abdominal aortic aneurysm) ([place])    saw Dr Kellie Simmering 11/2013.  2.7 cm  . Arthritis   . Chicken pox   . COPD (chronic obstructive pulmonary disease) ([facility])    smokes 1/2ppd  . DDD (degenerative disc disease), lumbar   . GERD (gastroesophageal reflux disease)    diet controlled  . History of blood transfusion   . History of lumbar fusion 08/09/2014  . Hyperlipidemia   . IBS (irritable bowel syndrome)    diet contolled  . Mild mitral regurgitation   . RBBB   . S/P arthroscopy of right shoulder 12/15/2017  . Seasonal allergies   . Varicose veins     Observations/Objective: A&O  No respiratory distress or wheezing audible over the phone Mood, judgement, and thought processes all WNL  Assessment and Plan: 1. Seasonal allergies - Continue Flonase, Pataday, and Claritin.  - montelukast (SINGULAIR) 10 MG tablet; Take 1 tablet (10 mg total) by mouth at bedtime.  Dispense: 30 tablet; Refill: 3   Follow Up Instructions:  I discussed the assessment and treatment plan with the patient. The patient was provided an opportunity to ask questions and all were answered. The patient agreed with the plan and demonstrated an understanding of the instructions.   The patient was advised to call back or seek an in-person evaluation  if the symptoms worsen or if the condition fails to improve as anticipated.  The above assessment and management plan was discussed with the patient. The patient verbalized understanding of and has agreed to the management plan. Patient is aware to call the clinic if symptoms persist or worsen. Patient is aware when to return to the clinic for a follow-up visit. Patient educated on when it is appropriate to go to the emergency department.   Time call ended: 1:51 PM  I provided 9 minutes of non-face-to-face time during this  encounter.  [name], MSN, APRN, FNP-C [facility] 02/14/20

## 2020-03-12 ENCOUNTER — Telehealth: Payer: Self-pay | Admitting: Family Medicine

## 2020-03-12 DIAGNOSIS — J302 Other seasonal allergic rhinitis: Secondary | ICD-10-CM

## 2020-03-12 MED ORDER — MONTELUKAST SODIUM 10 MG PO TABS: 10.0000 mg | ORAL_TABLET | Freq: Every day | ORAL | 1 refills | Status: DC

## 2020-03-12 NOTE — Telephone Encounter (Signed)
Pt would like to see if her Singulair can be changed to a 90 rx instead of a 30 day rx? [employer].

## 2020-03-12 NOTE — Telephone Encounter (Signed)
90day supply sent.

## 2020-03-13 ENCOUNTER — Ambulatory Visit (INDEPENDENT_AMBULATORY_CARE_PROVIDER_SITE_OTHER): Payer: Medicare Other

## 2020-03-13 DIAGNOSIS — Z0000 Encounter for general adult medical examination without abnormal findings: Secondary | ICD-10-CM | POA: Diagnosis not present

## 2020-03-13 NOTE — Patient Instructions (Signed)
  [facility] Maintenance Summary and Written Plan of Care  Ms. Dipasquale ,  Thank you for allowing me to perform your Medicare Annual Wellness Visit and for your ongoing commitment to your health.   Health Maintenance & Immunization History Health Maintenance  Topic Date Due  . INFLUENZA VACCINE  04/29/2020  . MAMMOGRAM  08/16/2020  . COLONOSCOPY  09/29/2020  . TETANUS/TDAP  02/01/2023  . DEXA SCAN  Completed  . COVID-19 Vaccine  Completed  . Hepatitis C Screening  Completed  . PNA vac Low Risk Adult  Completed   Immunization History  Administered Date(s) Administered  . Fluad Quad(high Dose 65+) 06/11/2019  . Influenza, High Dose Seasonal PF 07/27/2017, 06/29/2018  . Influenza, Seasonal, Injecte, Preservative Fre 07/04/2014, 08/09/2015  . Influenza-Unspecified 07/04/2014, 08/09/2015, 06/13/2019  . Moderna SARS-COVID-2 Vaccination 11/28/2019, 12/26/2019  . Pneumococcal Conjugate-13 05/10/2014, 08/09/2015  . Pneumococcal Polysaccharide-23 04/02/2017  . Tdap 01/31/2013  . Zoster 07/04/2014  . Zoster Recombinat (Shingrix) 12/04/2016, 07/11/2019, 09/27/2019    These are the patient goals that we discussed: Goals Addressed            This Visit's Progress   . Exercise 150 min/wk Moderate Activity      . Have 3 meals a day          This is a list of Health Maintenance Items that are overdue or due now: There are no preventive care reminders to display for this patient.   Orders/Referrals Placed Today: No orders of the defined types were placed in this encounter.  (Contact our referral department at [phone] if you have not spoken with someone about your referral appointment within the next 5 days)    Follow-up Plan  Scheduled with [name], FNP July 13th at 1:35pm.

## 2020-03-13 NOTE — Progress Notes (Addendum)
MEDICARE ANNUAL WELLNESS VISIT  03/13/2020  Telephone Visit Disclaimer This Medicare AWV was conducted by telephone due to national recommendations for restrictions regarding the COVID-19 Pandemic (e.g. social distancing).  I verified, using two identifiers, that I am speaking with [name] or their authorized healthcare agent. I discussed the limitations, risks, security, and privacy concerns of performing an evaluation and management service by telephone and the potential availability of an in-person appointment in the future. The patient expressed understanding and agreed to proceed.   Subjective:  AANIKA Melendez is a 74 y.o. female patient of [name], FNP who had a Medicare Annual Wellness Visit today via telephone. Shannette is Retired and lives alone. she has no children. she reports that she is socially active and does interact with friends/family regularly. she is minimally physically active and enjoys reading.  Patient Care Team: [name], FNP as PCP - General (Family Medicine) Leeroy Cha, MD as Consulting Physician (Neurosurgery) Clydell Hakim, MD as Consulting Physician (Pain Medicine) Richmond Campbell, MD as Consulting Physician (Gastroenterology) [facility] [place] (Vascular Surgery)  Advanced Directives 03/13/2020 08/15/2019 08/09/2019 12/23/2017 01/26/2017 01/13/2017 08/05/2016  Does Patient Have a Medical Advance Directive? Yes Yes Yes Yes Yes Yes No  Type of Paramedic of [place];Living will [place];Living will [place];Living will - [place];Living will [place];Living will -  Does patient want to make changes to medical advance directive? No - Patient declined No - Patient declined No - Patient declined - - - -  Copy of [place] in Chart? - No - copy requested No - copy requested - - - -  Would patient like  information on creating a medical advance directive? - - - - - - No - patient declined information    Hospital Utilization Over the Past 12 Months: # of hospitalizations or ER visits: 0 # of surgeries: 0  Review of Systems    Patient reports that her overall health is unchanged compared to last year.    Patient Reported Readings (BP, Pulse, CBG, Weight, etc) none  Pain Assessment Pain : 0-10 Pain Score: 7  Pain Type: Chronic pain Pain Location: Back Pain Orientation: Lower Pain Descriptors / Indicators: Constant Pain Onset: More than a month ago Pain Frequency: Constant     Current Medications & Allergies (verified) Allergies as of 03/13/2020       Reactions   Bee Venom    Other reaction(s): Redness   Cephalexin Other (See Comments)   Headaches   Gabapentin Diarrhea        Medication List        Accurate as of March 13, 2020  8:39 AM. If you have any questions, ask your nurse or doctor.          atorvastatin 10 MG tablet Commonly known as: LIPITOR TAKE 1 TABLET DAILY   fluticasone 50 MCG/ACT nasal spray Commonly known as: FLONASE Place 2 sprays into both nostrils daily.   HYDROcodone-acetaminophen 10-325 MG tablet Commonly known as: NORCO Take 1 tablet by mouth 3 (three) times daily as needed.   loratadine 10 MG tablet Commonly known as: CLARITIN Take 10 mg by mouth daily.   montelukast 10 MG tablet Commonly known as: SINGULAIR Take 1 tablet (10 mg total) by mouth at bedtime.   ondansetron 4 MG disintegrating tablet Commonly known as: Zofran ODT Take 1 tablet (4 mg total) by mouth  every 8 (eight) hours as needed for nausea or vomiting.   PATADAY OP Apply to eye.   SUMAtriptan 50 MG tablet Commonly known as: Imitrex Take 1 tablet (50 mg total) by mouth every 2 (two) hours as needed for migraine. May repeat in 2 hours if headache persists or recurs.        History (reviewed): Past Medical History:  Diagnosis Date   AAA (abdominal  aortic aneurysm) ([facility])    saw Dr Kellie Simmering 11/2013.  2.7 cm   Arthritis    Chicken pox    COPD (chronic obstructive pulmonary disease) ([facility])    smokes 1/2ppd   DDD (degenerative disc disease), lumbar    GERD (gastroesophageal reflux disease)    diet controlled   History of blood transfusion    History of lumbar fusion 08/09/2014   Hyperlipidemia    IBS (irritable bowel syndrome)    diet contolled   Mild mitral regurgitation    RBBB    S/P arthroscopy of right shoulder 12/15/2017   Seasonal allergies    Varicose veins    Past Surgical History:  Procedure Laterality Date   APPENDECTOMY     BACK SURGERY N/A [phone]   5 back surgeries   BREAST BIOPSY  1989,05   lt br bx   BREAST EXCISIONAL BIOPSY Bilateral    CATARACT EXTRACTION W/ INTRAOCULAR LENS IMPLANT Bilateral 2018   CHOLECYSTECTOMY     EXCISION/RELEASE BURSA HIP Right 05/12/2013   Procedure: EXCISION RIGHT TROCHANTERIC BURSA/CALCIFICATION ;  Surgeon: Ninetta Lights, MD;  Location: [facility];  Service: Orthopedics;  Laterality: Right;   FASCIOTOMY Right 05/12/2013   Procedure: RIGHT FASCIOTOMY HIP ANY TYPE;  Surgeon: Ninetta Lights, MD;  Location: [facility];  Service: Orthopedics;  Laterality: Right;   LARYNGOSCOPY AND BRONCHOSCOPY N/A 08/15/2019   Procedure: BRONCHOSCOPY/LARYNGOSCOPY;  Surgeon: Leta Baptist, MD;  Location: [facility];  Service: ENT;  Laterality: N/A;   NM [place]  04/13/2003   negative   SHOULDER ARTHROSCOPY WITH OPEN ROTATOR CUFF REPAIR Right 2018   TONSILLECTOMY AND ADENOIDECTOMY  1950   TOTAL HIP ARTHROPLASTY Left 2006   lt total hip   TOTAL HIP ARTHROPLASTY Right 2011   TOTAL HIP ARTHROPLASTY Right 2016   second surgery due to recall   US ECHOCARDIOGRAPHY  04/13/2003   EF 65%, mild TR w/suggestion of mild pulmonary hypertension,thickened anterior leaflet,central mild MR.   VAGINAL HYSTERECTOMY  1993   Family History  Problem Relation Age  of Onset   Early death Mother        aortic aneurysm   Vasculitis Mother        temporal arteritis   Aortic aneurysm Mother    Heart disease Mother        before age 56   Hyperlipidemia Mother    Hypertension Mother    COPD Sister    Hypertension Sister    Depression Sister    Heart disease Sister        before age 35   Post-traumatic stress disorder Sister    Bipolar disorder Sister    Brain cancer Maternal Aunt 84       brain   Heart disease Maternal Grandmother        aortic aneurysm   Aortic aneurysm Maternal Grandmother    Hypertension Sister    Throat cancer Maternal Grandfather    Breast cancer Neg Hx    Social History   Socioeconomic History  Marital status: Legally Separated    Spouse name: Not on file   Number of children: Not on file   Years of education: Not on file   Highest education level: Not on file  Occupational History   Not on file  Tobacco Use   Smoking status: Current Every Day Smoker    Packs/day: 0.50    Years: 30.00    Pack years: 15.00    Types: Cigarettes   Smokeless tobacco: Never Used  Vaping Use   Vaping Use: Never used  Substance and Sexual Activity   Alcohol use: Not Currently    Alcohol/week: 0.0 standard drinks    Comment: very occasional, not monthly   Drug use: No   Sexual activity: Not Currently  Other Topics Concern   Not on file  Social History Narrative   Not on file   Social Determinants of Health   Financial Resource Strain:    Difficulty of Paying Living Expenses:   Food Insecurity:    Worried About Charity fundraiser in the Last Year:    Arboriculturist in the Last Year:   Transportation Needs:    Film/video editor (Medical):    Lack of Transportation (Non-Medical):   Physical Activity:    Days of Exercise per Week:    Minutes of Exercise per Session:   Stress:    Feeling of Stress :   Social Connections:    Frequency of Communication with Friends and Family:    Frequency of Social Gatherings  with Friends and Family:    Attends Religious Services:    Active Member of Clubs or Organizations:    Attends Archivist Meetings:    Marital Status:     Activities of Daily Living In your present state of health, do you have any difficulty performing the following activities: 03/13/2020 08/15/2019  Hearing? N N  Vision? N N  Difficulty concentrating or making decisions? N N  Walking or climbing stairs? N N  Dressing or bathing? N N  Doing errands, shopping? N -  Preparing Food and eating ? N -  Using the Toilet? N -  In the past six months, have you accidently leaked urine? N -  Do you have problems with loss of bowel control? N -  Managing your Medications? N -  Managing your Finances? N -  Housekeeping or managing your Housekeeping? 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   Depression Screen PHQ 2/9 Scores 03/13/2020 10/11/2019 07/28/2019 06/15/2019 05/10/2014  PHQ - 2 Score 0 0 0 0 0  PHQ- 9 Score - - 0 0 -     Fall Risk Fall Risk  03/13/2020 10/11/2019 07/28/2019 05/10/2014  Falls in the past year? 0 0 0 No     Objective:  KOURTNEE LAHEY seemed alert and oriented and she participated appropriately during our telephone  visit.  Blood Pressure Weight BMI  BP Readings from Last 3 Encounters:  10/11/19 126/79  08/15/19  131/74  07/28/19 133/83   Wt Readings from Last 3 Encounters:  10/11/19 127 lb 3.2 oz (57.7 kg)  08/15/19 123 lb 14.4 oz (56.2 kg)  07/28/19 122 lb (55.3 kg)   BMI Readings from Last 1 Encounters:  10/11/19 20.53 kg/m    *Unable to obtain current vital signs, weight, and BMI due to telephone visit type  Hearing/Vision  Tyshika did not seem to have difficulty with hearing/understanding during the telephone conversation Reports that she has had a formal eye exam by an eye care professional within the past year Reports that she has not had a formal hearing evaluation within the past year *Unable to fully assess hearing and vision during telephone visit type  Cognitive Function: 6CIT Screen 03/13/2020  What Year? 0 points  What month? 0 points  What time? 0 points  Count back from 20 0 points  Months in reverse 0 points  Repeat phrase 0 points  Total Score 0   (Normal:0-7, Significant for Dysfunction: >8)  Normal Cognitive Function Screening: Yes   Immunization & Health Maintenance Record Immunization History  Administered Date(s) Administered   Fluad Quad(high Dose 65+) 06/11/2019   Influenza, High Dose Seasonal PF 07/27/2017, 06/29/2018   Influenza, Seasonal, Injecte, Preservative Fre 07/04/2014, 08/09/2015   Influenza-Unspecified 07/04/2014, 08/09/2015, 06/13/2019   Moderna SARS-COVID-2 Vaccination 11/28/2019, 12/26/2019   Pneumococcal Conjugate-13 05/10/2014, 08/09/2015   Pneumococcal Polysaccharide-23 04/02/2017   Tdap 01/31/2013   Zoster 07/04/2014   Zoster Recombinat (Shingrix) 12/04/2016, 07/11/2019, 09/27/2019    Health Maintenance  Topic Date Due   INFLUENZA VACCINE  04/29/2020   MAMMOGRAM  08/16/2020   COLONOSCOPY  09/29/2020   TETANUS/TDAP  02/01/2023   DEXA SCAN  Completed   COVID-19 Vaccine  Completed   Hepatitis C Screening  Completed   PNA vac Low Risk Adult  Completed       Assessment  This is a routine wellness examination for [name].  Health Maintenance: Due or Overdue There are no preventive care reminders to display for this patient.  [name] does not need a referral for Community Assistance: Care Management:   no Social Work:    no Prescription Assistance:  no Nutrition/Diabetes Education:  no   Plan:  Personalized Goals Goals Addressed             This Visit's Progress    Exercise 150 min/wk Moderate Activity       Have 3 meals a day         Personalized Health Maintenance & Screening Recommendations    Lung Cancer Screening Recommended: no (Low Dose CT Chest recommended if Age 8-80 years, 30 pack-year currently smoking OR have quit w/in past 15 years) Hepatitis C Screening recommended: no HIV Screening recommended: no  Advanced Directives: Written information was not prepared per patient's request.  Referrals & Orders No orders of the defined types were placed in this encounter.   Follow-up Plan Follow-up with [name], FNP as planned Schedule 04/10/2020   I have personally reviewed and noted the following in the patient's chart:   Medical and social history Use of alcohol, tobacco or illicit drugs  Current medications and supplements Functional ability and status Nutritional status Physical activity Advanced directives List of other physicians Hospitalizations, surgeries, and ER visits in previous 12 months Vitals Screenings to include cognitive, depression, and falls Referrals and appointments  In addition, I have reviewed and discussed with [name] certain preventive protocols, quality metrics, and best practice recommendations. A written personalized care plan for preventive services as well as general preventive health recommendations is available and can be mailed to the patient at her request.      Maud Deed [facility]    1/74/0992    I have reviewed and agree with the above AWV documentation.    Evelina Dun, FNP

## 2020-03-23 ENCOUNTER — Telehealth: Payer: Self-pay | Admitting: Family Medicine

## 2020-03-23 NOTE — Telephone Encounter (Signed)
Patient fell yesterday and is now having right leg pain.  Cannot come in today to be seen, wants to wait until Monday.  Appointment scheduled on Monday with [name].

## 2020-03-26 ENCOUNTER — Other Ambulatory Visit: Payer: Self-pay

## 2020-03-26 ENCOUNTER — Ambulatory Visit (INDEPENDENT_AMBULATORY_CARE_PROVIDER_SITE_OTHER): Payer: Medicare Other | Admitting: Nurse Practitioner

## 2020-03-26 ENCOUNTER — Ambulatory Visit (INDEPENDENT_AMBULATORY_CARE_PROVIDER_SITE_OTHER): Payer: Medicare Other

## 2020-03-26 ENCOUNTER — Encounter: Payer: Self-pay | Admitting: Nurse Practitioner

## 2020-03-26 VITALS — BP 132/73 | HR 88 | Temp 98.2°F | Resp 20 | Ht 66.0 in | Wt 119.0 lb

## 2020-03-26 DIAGNOSIS — M25559 Pain in unspecified hip: Secondary | ICD-10-CM

## 2020-03-26 DIAGNOSIS — M25551 Pain in right hip: Secondary | ICD-10-CM | POA: Diagnosis not present

## 2020-03-26 DIAGNOSIS — S79911A Unspecified injury of right hip, initial encounter: Secondary | ICD-10-CM | POA: Diagnosis not present

## 2020-03-26 NOTE — Progress Notes (Signed)
   Subjective:    Patient ID: KALASIA CRAFTON, female    DOB: April 24, 1946, 74 y.o.   MRN: [id number]  Chief Complaint: Leg Pain   HPI Patient ell last week and injured her right hip. She says there is a knot on her hip. Painful to walk on. She has had a hip replacement on both sides.    Review of Systems  Musculoskeletal: Positive for arthralgias (right hip).  All other systems reviewed and are negative.      Objective:   Physical Exam Vitals and nursing note reviewed.  Constitutional:      Appearance: Normal appearance.  Cardiovascular:     Rate and Rhythm: Normal rate and regular rhythm.     Heart sounds: Normal heart sounds.  Pulmonary:     Breath sounds: Normal breath sounds.  Musculoskeletal:     Comments: Point tenderness right buttocks. Decrease ROM of right hip with pain on external rotation and abduction. No edema No hip tnderness.  Skin:    General: Skin is warm.  Neurological:     General: No focal deficit present.     Mental Status: She is alert and oriented to person, place, and time.  Psychiatric:        Mood and Affect: Mood normal.        Behavior: Behavior normal.    BP 132/73   Pulse 88   Temp 98.2 F (36.8 C) (Temporal)   Resp 20   Ht 5\' 6"  (1.676 m)   Wt 119 lb (54 kg)   SpO2 96%   BMI 19.21 kg/m   Right hip xray- prosthesis intact- no visible fracture-Preliminary reading by Ronnald Collum, FNP  [facility]'       Assessment & Plan:  Vilma Meckel in today with chief complaint of Leg Pain   1. Hip pain Waiting on radiology report Limit weight bearing - DG HIP UNILAT W OR W/O PELVIS 2-3 VIEWS RIGHT    The above assessment and management plan was discussed with the patient. The patient verbalized understanding of and has agreed to the management plan. Patient is aware to call the clinic if symptoms persist or worsen. Patient is aware when to return to the clinic for a follow-up visit. Patient educated on when it is appropriate to go to  the emergency department.   Mary-Margaret Hassell Done, FNP

## 2020-04-10 ENCOUNTER — Other Ambulatory Visit: Payer: Self-pay

## 2020-04-10 ENCOUNTER — Encounter: Payer: Self-pay | Admitting: Family Medicine

## 2020-04-10 ENCOUNTER — Ambulatory Visit (INDEPENDENT_AMBULATORY_CARE_PROVIDER_SITE_OTHER): Payer: Medicare Other | Admitting: Family Medicine

## 2020-04-10 VITALS — BP 132/84 | HR 98 | Temp 98.0°F | Ht 66.0 in | Wt 120.2 lb

## 2020-04-10 DIAGNOSIS — G43009 Migraine without aura, not intractable, without status migrainosus: Secondary | ICD-10-CM

## 2020-04-10 DIAGNOSIS — Z0000 Encounter for general adult medical examination without abnormal findings: Secondary | ICD-10-CM

## 2020-04-10 DIAGNOSIS — E559 Vitamin D deficiency, unspecified: Secondary | ICD-10-CM | POA: Diagnosis not present

## 2020-04-10 DIAGNOSIS — M5136 Other intervertebral disc degeneration, lumbar region: Secondary | ICD-10-CM | POA: Diagnosis not present

## 2020-04-10 DIAGNOSIS — I714 Abdominal aortic aneurysm, without rupture, unspecified: Secondary | ICD-10-CM

## 2020-04-10 DIAGNOSIS — E782 Mixed hyperlipidemia: Secondary | ICD-10-CM

## 2020-04-10 MED ORDER — ONDANSETRON 4 MG PO TBDP: 4.0000 mg | ORAL_TABLET | Freq: Three times a day (TID) | ORAL | 0 refills | Status: DC | PRN

## 2020-04-10 MED ORDER — SUMATRIPTAN SUCCINATE 50 MG PO TABS: 50.0000 mg | ORAL_TABLET | ORAL | 0 refills | Status: DC | PRN

## 2020-04-10 NOTE — Progress Notes (Signed)
Assessment & Plan:  1. Mixed hyperlipidemia - Labs today to assess. Patient was started on statin ~6 months ago and would like to come off of it. - CMP14+EGFR - Lipid panel  2. AAA (abdominal aortic aneurysm) without rupture ([place]) - Managed by [facility]  3. Vitamin D deficiency - Patient does not desire labs due to intolerance of supplement.   4. Lumbar degenerative disc disease - Managed by pain management. Patient is upset that Dr. Maryjean Ka is about to retire.   5. Migraine without aura and without status migrainosus, not intractable - Well controlled on current regimen.  - SUMAtriptan (IMITREX) 50 MG tablet; Take 1 tablet (50 mg total) by mouth every 2 (two) hours as needed for migraine. May repeat in 2 hours if headache persists or recurs.  Dispense: 10 tablet; Refill: 0 - ondansetron (ZOFRAN ODT) 4 MG disintegrating tablet; Take 1 tablet (4 mg total) by mouth every 8 (eight) hours as needed for nausea or vomiting.  Dispense: 20 tablet; Refill: 0  6. Healthcare maintenance - Patient to schedule mammogram.    Return in about 6 months (around 10/11/2020) for annual physical.  [name], MSN, APRN, FNP-C [facility]  Subjective:    Patient ID: [name], female    DOB: 06/18/1946, 74 y.o.   MRN: [id number]  Patient Care Team: [name], FNP as PCP - General (Family Medicine) Leeroy Cha, MD as Consulting Physician (Neurosurgery) Clydell Hakim, MD as Consulting Physician (Pain Medicine) Richmond Campbell, MD as Consulting Physician (Gastroenterology) [facility] ([facility])   Chief Complaint:  Chief Complaint  Patient presents with   Hyperlipidemia    6 month follow up of chronic medical conditoins    HPI: [name] is a 74 y.o. female presenting on 04/10/2020 for Hyperlipidemia (6 month follow up of chronic medical conditoins)  [facility] surveillance  of AAA. Surgery is not indicated as of her last visit on 07/26/2019. It was recommended she return in 1 year.   Patient has a history of vitamin D deficiency but states she is unable to tolerate supplements due to her IBS.   New complaints: None  Social history:  Relevant past medical, surgical, family and social history reviewed and updated as indicated. Interim medical history since our last visit reviewed.  Allergies and medications reviewed and updated.  DATA REVIEWED: CHART IN EPIC  ROS: Negative unless specifically indicated above in HPI.    Current Outpatient Medications:    atorvastatin (LIPITOR) 10 MG tablet, TAKE 1 TABLET DAILY, Disp: 90 tablet, Rfl: 0   fluticasone (FLONASE) 50 MCG/ACT nasal spray, Place 2 sprays into both nostrils daily., Disp: , Rfl:    HYDROcodone-acetaminophen (NORCO) 10-325 MG tablet, Take 1 tablet by mouth 3 (three) times daily as needed., Disp: , Rfl:    loratadine (CLARITIN) 10 MG tablet, Take 10 mg by mouth daily., Disp: , Rfl:    montelukast (SINGULAIR) 10 MG tablet, Take 1 tablet (10 mg total) by mouth at bedtime., Disp: 90 tablet, Rfl: 1   Olopatadine HCl (PATADAY OP), Apply to eye., Disp: , Rfl:    ondansetron (ZOFRAN ODT) 4 MG disintegrating tablet, Take 1 tablet (4 mg total) by mouth every 8 (eight) hours as needed for nausea or vomiting., Disp: 20 tablet, Rfl: 0   SUMAtriptan (IMITREX) 50 MG tablet, Take 1 tablet (50 mg total) by mouth every 2 (two) hours as needed for migraine. May  repeat in 2 hours if headache persists or recurs., Disp: 10 tablet, Rfl: 0   Allergies  Allergen Reactions   Bee Venom     Other reaction(s): Redness   Cephalexin Other (See Comments)    Headaches   Gabapentin Diarrhea   Past Medical History:  Diagnosis Date   AAA (abdominal aortic aneurysm) ([place])    saw Dr Kellie Simmering 11/2013.  2.7 cm   Arthritis    Chicken pox    COPD (chronic obstructive pulmonary disease) ([facility])    smokes 1/2ppd   DDD  (degenerative disc disease), lumbar    GERD (gastroesophageal reflux disease)    diet controlled   History of blood transfusion    History of lumbar fusion 08/09/2014   Hyperlipidemia    IBS (irritable bowel syndrome)    diet contolled   Mild mitral regurgitation    RBBB    S/P arthroscopy of right shoulder 12/15/2017   Seasonal allergies    Varicose veins     Past Surgical History:  Procedure Laterality Date   APPENDECTOMY     BACK SURGERY N/A [phone]   5 back surgeries   BREAST BIOPSY  1989,05   lt br bx   BREAST EXCISIONAL BIOPSY Bilateral    CATARACT EXTRACTION W/ INTRAOCULAR LENS IMPLANT Bilateral 2018   CHOLECYSTECTOMY     EXCISION/RELEASE BURSA HIP Right 05/12/2013   Procedure: EXCISION RIGHT TROCHANTERIC BURSA/CALCIFICATION ;  Surgeon: Ninetta Lights, MD;  Location: [facility];  Service: Orthopedics;  Laterality: Right;   FASCIOTOMY Right 05/12/2013   Procedure: RIGHT FASCIOTOMY HIP ANY TYPE;  Surgeon: Ninetta Lights, MD;  Location: [place];  Service: Orthopedics;  Laterality: Right;   LARYNGOSCOPY AND BRONCHOSCOPY N/A 08/15/2019   Procedure: BRONCHOSCOPY/LARYNGOSCOPY;  Surgeon: Leta Baptist, MD;  Location: [place];  Service: ENT;  Laterality: N/A;   NM [place]  04/13/2003   negative   SHOULDER ARTHROSCOPY WITH OPEN ROTATOR CUFF REPAIR Right 2018   TONSILLECTOMY AND ADENOIDECTOMY  1950   TOTAL HIP ARTHROPLASTY Left 2006   lt total hip   TOTAL HIP ARTHROPLASTY Right 2011   TOTAL HIP ARTHROPLASTY Right 2016   second surgery due to recall   US ECHOCARDIOGRAPHY  04/13/2003   EF 65%, mild TR w/suggestion of mild pulmonary hypertension,thickened anterior leaflet,central mild MR.   VAGINAL HYSTERECTOMY  1993    Social History   Socioeconomic History   Marital status: Legally Separated    Spouse name: Not on file   Number of children: Not on file   Years of education: Not on  file   Highest education level: Not on file  Occupational History   Not on file  Tobacco Use   Smoking status: Current Every Day Smoker    Packs/day: 0.50    Years: 30.00    Pack years: 15.00    Types: Cigarettes   Smokeless tobacco: Never Used  Vaping Use   Vaping Use: Never used  Substance and Sexual Activity   Alcohol use: Not Currently    Alcohol/week: 0.0 standard drinks    Comment: very occasional, not monthly   Drug use: No   Sexual activity: Not Currently  Other Topics Concern   Not on file  Social History Narrative   Not on file   Social Determinants of Health   Financial Resource Strain:    Difficulty of Paying Living Expenses:   Food Insecurity:    Worried About Estate manager/land agent of Food  in the Last Year:    Arboriculturist in the Last Year:   Transportation Needs:    Film/video editor (Medical):    Lack of Transportation (Non-Medical):   Physical Activity:    Days of Exercise per Week:    Minutes of Exercise per Session:   Stress:    Feeling of Stress :   Social Connections:    Frequency of Communication with Friends and Family:    Frequency of Social Gatherings with Friends and Family:    Attends Religious Services:    Active Member of Clubs or Organizations:    Attends Archivist Meetings:    Marital Status:   Intimate Partner Violence:    Fear of Current or Ex-Partner:    Emotionally Abused:    Physically Abused:    Sexually Abused:         Objective:    BP 132/84    Pulse 98    Temp 98 F (36.7 C) (Temporal)    Ht _0  (1.676 m)    Wt 120 lb 3.2 oz (54.5 kg)    SpO2 98%    BMI 19.40 kg/m   Wt Readings from Last 3 Encounters:  04/10/20 120 lb 3.2 oz (54.5 kg)  03/26/20 119 lb (54 kg)  10/11/19 127 lb 3.2 oz (57.7 kg)    Physical Exam Vitals reviewed.  Constitutional:      General: She is not in acute distress.    Appearance: Normal appearance. She is underweight. She is not ill-appearing,  toxic-appearing or diaphoretic.  HENT:     Head: Normocephalic and atraumatic.  Eyes:     General: No scleral icterus.       Right eye: No discharge.        Left eye: No discharge.     Conjunctiva/sclera: Conjunctivae normal.  Cardiovascular:     Rate and Rhythm: Normal rate and regular rhythm.     Heart sounds: Normal heart sounds. No murmur heard.  No friction rub. No gallop.   Pulmonary:     Effort: Pulmonary effort is normal. No respiratory distress.     Breath sounds: Normal breath sounds. No stridor. No wheezing, rhonchi or rales.  Musculoskeletal:        General: Normal range of motion.     Cervical back: Normal range of motion.  Skin:    General: Skin is warm and dry.     Capillary Refill: Capillary refill takes less than 2 seconds.  Neurological:     General: No focal deficit present.     Mental Status: She is alert and oriented to person, place, and time. Mental status is at baseline.  Psychiatric:        Mood and Affect: Mood normal.        Behavior: Behavior normal.        Thought Content: Thought content normal.        Judgment: Judgment normal.     Lab Results  Component Value Date   TSH 0.80 05/10/2014   Lab Results  Component Value Date   WBC 7.9 10/11/2019   HGB 14.7 10/11/2019   HCT 42.6 10/11/2019   MCV 97 10/11/2019   PLT 232 10/11/2019   Lab Results  Component Value Date   NA 138 10/11/2019   K 4.5 10/11/2019   CO2 24 10/11/2019   GLUCOSE 83 10/11/2019   BUN 9 10/11/2019   CREATININE 0.60 10/11/2019   BILITOT 0.4 10/11/2019   ALKPHOS  71 10/11/2019   AST 20 10/11/2019   ALT 10 10/11/2019   PROT 6.5 10/11/2019   ALBUMIN 4.3 10/11/2019   CALCIUM 9.0 10/11/2019   GFR 81.24 05/10/2014   Lab Results  Component Value Date   CHOL 220 (H) 10/11/2019   Lab Results  Component Value Date   HDL 61 10/11/2019   Lab Results  Component Value Date   LDLCALC 139 (H) 10/11/2019   Lab Results  Component Value Date   TRIG 113 10/11/2019    Lab Results  Component Value Date   CHOLHDL 3.6 10/11/2019   No results found for: HGBA1C

## 2020-04-10 NOTE — Patient Instructions (Signed)
Schedule your mammogram;

## 2020-04-11 LAB — CMP14+EGFR
ALT: 11 IU/L (ref 0–32)
AST: 22 IU/L (ref 0–40)
Albumin/Globulin Ratio: 2 (ref 1.2–2.2)
Albumin: 4.4 g/dL (ref 3.7–4.7)
Alkaline Phosphatase: 109 IU/L (ref 48–121)
BUN/Creatinine Ratio: 11 — ABNORMAL LOW (ref 12–28)
BUN: 8 mg/dL (ref 8–27)
Bilirubin Total: 0.4 mg/dL (ref 0.0–1.2)
CO2: 24 mmol/L (ref 20–29)
Calcium: 9.2 mg/dL (ref 8.7–10.3)
Chloride: 99 mmol/L (ref 96–106)
Creatinine, Ser: 0.73 mg/dL (ref 0.57–1.00)
GFR calc Af Amer: 93 mL/min/{1.73_m2} (ref >59)
GFR calc non Af Amer: 81 mL/min/{1.73_m2} (ref >59)
Globulin, Total: 2.2 g/dL (ref 1.5–4.5)
Glucose: 89 mg/dL (ref 65–99)
Potassium: 4.2 mmol/L (ref 3.5–5.2)
Sodium: 139 mmol/L (ref 134–144)
Total Protein: 6.6 g/dL (ref 6.0–8.5)

## 2020-04-11 LAB — LIPID PANEL
Chol/HDL Ratio: 2.9 ratio (ref 0.0–4.4)
Cholesterol, Total: 167 mg/dL (ref 100–199)
HDL: 58 mg/dL (ref >39)
LDL Chol Calc (NIH): 92 mg/dL (ref 0–99)
Triglycerides: 92 mg/dL (ref 0–149)
VLDL Cholesterol Cal: 17 mg/dL (ref 5–40)

## 2020-04-12 ENCOUNTER — Other Ambulatory Visit: Payer: Self-pay | Admitting: Family Medicine

## 2020-04-12 DIAGNOSIS — Z1231 Encounter for screening mammogram for malignant neoplasm of breast: Secondary | ICD-10-CM

## 2020-04-19 DIAGNOSIS — M47817 Spondylosis without myelopathy or radiculopathy, lumbosacral region: Secondary | ICD-10-CM | POA: Diagnosis not present

## 2020-05-07 ENCOUNTER — Ambulatory Visit: Payer: Medicare Other

## 2020-05-23 DIAGNOSIS — G8929 Other chronic pain: Secondary | ICD-10-CM | POA: Diagnosis not present

## 2020-05-23 DIAGNOSIS — M47817 Spondylosis without myelopathy or radiculopathy, lumbosacral region: Secondary | ICD-10-CM | POA: Diagnosis not present

## 2020-06-05 ENCOUNTER — Telehealth: Payer: Self-pay | Admitting: Family Medicine

## 2020-06-05 NOTE — Telephone Encounter (Signed)
Patient states she wanted Dr. Warrick Parisian. She was advised he was not accepting new patient either, but she wanted me to ask anyway. Please advise

## 2020-06-05 NOTE — Telephone Encounter (Signed)
I am not accepting new patients at the moment.  I'm glad to see her as a consult in the future if she needs something specific.  Please let her know that I am the supervising doctor for [name], so I'm glad to review her chart if needed.

## 2020-06-05 NOTE — Telephone Encounter (Signed)
He has unfortunately I cannot accept any new patients at this point either.

## 2020-06-05 NOTE — Telephone Encounter (Signed)
Pt would like to change her PCP from [facility] to Dr. Lajuana Ripple. Please advise.

## 2020-06-06 NOTE — Telephone Encounter (Signed)
If she wants a physician her only option at the current time is Dr. Livia Snellen.

## 2020-06-06 NOTE — Telephone Encounter (Signed)
Patient asking to change to [name]. Patient states she gets her pain med from pain med doctor. Are you willing to take on patient?

## 2020-06-07 NOTE — Telephone Encounter (Signed)
That is fine 

## 2020-06-07 NOTE — Telephone Encounter (Signed)
Please advise if it is okay for patient to switch

## 2020-06-07 NOTE — Telephone Encounter (Signed)
Oh I am fine with it.

## 2020-06-07 NOTE — Telephone Encounter (Signed)
Appointment scheduled.

## 2020-06-12 ENCOUNTER — Encounter: Payer: Self-pay | Admitting: Family Medicine

## 2020-06-12 ENCOUNTER — Ambulatory Visit (INDEPENDENT_AMBULATORY_CARE_PROVIDER_SITE_OTHER): Payer: Medicare Other | Admitting: Family Medicine

## 2020-06-12 ENCOUNTER — Other Ambulatory Visit: Payer: Self-pay

## 2020-06-12 VITALS — BP 118/75 | HR 93 | Temp 98.4°F | Ht 66.0 in | Wt 119.2 lb

## 2020-06-12 DIAGNOSIS — M5136 Other intervertebral disc degeneration, lumbar region: Secondary | ICD-10-CM

## 2020-06-12 DIAGNOSIS — E782 Mixed hyperlipidemia: Secondary | ICD-10-CM | POA: Diagnosis not present

## 2020-06-12 DIAGNOSIS — I714 Abdominal aortic aneurysm, without rupture, unspecified: Secondary | ICD-10-CM

## 2020-06-12 DIAGNOSIS — G43009 Migraine without aura, not intractable, without status migrainosus: Secondary | ICD-10-CM | POA: Diagnosis not present

## 2020-06-12 MED ORDER — SUMATRIPTAN SUCCINATE 50 MG PO TABS: 50.0000 mg | ORAL_TABLET | ORAL | 3 refills | Status: DC | PRN

## 2020-06-12 NOTE — Patient Instructions (Signed)
DASH Eating Plan DASH stands for "Dietary Approaches to Stop Hypertension." The DASH eating plan is a healthy eating plan that has been shown to reduce high blood pressure (hypertension). It may also reduce your risk for type 2 diabetes, heart disease, and stroke. The DASH eating plan may also help with weight loss. What are tips for following this plan?  General guidelines  Avoid eating more than 2,300 mg (milligrams) of salt (sodium) a day. If you have hypertension, you may need to reduce your sodium intake to 1,500 mg a day.  Limit alcohol intake to no more than 1 drink a day for nonpregnant women and 2 drinks a day for men. One drink equals 12 oz of beer, 5 oz of wine, or 1 oz of hard liquor.  Work with your health care provider to maintain a healthy body weight or to lose weight. Ask what an ideal weight is for you.  Get at least 30 minutes of exercise that causes your heart to beat faster (aerobic exercise) most days of the week. Activities may include walking, swimming, or biking.  Work with your health care provider or diet and nutrition specialist (dietitian) to adjust your eating plan to your individual calorie needs. Reading food labels   Check food labels for the amount of sodium per serving. Choose foods with less than 5 percent of the Daily Value of sodium. Generally, foods with less than 300 mg of sodium per serving fit into this eating plan.  To find whole grains, look for the word "whole" as the first word in the ingredient list. Shopping  Buy products labeled as "low-sodium" or "no salt added."  Buy fresh foods. Avoid canned foods and premade or frozen meals. Cooking  Avoid adding salt when cooking. Use salt-free seasonings or herbs instead of table salt or sea salt. Check with your health care provider or pharmacist before using salt substitutes.  Do not fry foods. Cook foods using healthy methods such as baking, boiling, grilling, and broiling instead.  Cook with  heart-healthy oils, such as olive, canola, soybean, or sunflower oil. Meal planning  Eat a balanced diet that includes: ? 5 or more servings of fruits and vegetables each day. At each meal, try to fill half of your plate with fruits and vegetables. ? Up to 6-8 servings of whole grains each day. ? Less than 6 oz of lean meat, poultry, or fish each day. A 3-oz serving of meat is about the same size as a deck of cards. One egg equals 1 oz. ? 2 servings of low-fat dairy each day. ? A serving of nuts, seeds, or beans 5 times each week. ? Heart-healthy fats. Healthy fats called Omega-3 fatty acids are found in foods such as flaxseeds and coldwater fish, like sardines, salmon, and mackerel.  Limit how much you eat of the following: ? Canned or prepackaged foods. ? Food that is high in trans fat, such as fried foods. ? Food that is high in saturated fat, such as fatty meat. ? Sweets, desserts, sugary drinks, and other foods with added sugar. ? Full-fat dairy products.  Do not salt foods before eating.  Try to eat at least 2 vegetarian meals each week.  Eat more home-cooked food and less restaurant, buffet, and fast food.  When eating at a restaurant, ask that your food be prepared with less salt or no salt, if possible. What foods are recommended? The items listed may not be a complete list. Talk with your dietitian about   what dietary choices are best for you. Grains Whole-grain or whole-wheat bread. Whole-grain or whole-wheat pasta. Brown rice. Oatmeal. Quinoa. Bulgur. Whole-grain and low-sodium cereals. Pita bread. Low-fat, low-sodium crackers. Whole-wheat flour tortillas. Vegetables Fresh or frozen vegetables (raw, steamed, roasted, or grilled). Low-sodium or reduced-sodium tomato and vegetable juice. Low-sodium or reduced-sodium tomato sauce and tomato paste. Low-sodium or reduced-sodium canned vegetables. Fruits All fresh, dried, or frozen fruit. Canned fruit in natural juice (without  added sugar). Meat and other protein foods Skinless chicken or turkey. Ground chicken or turkey. Pork with fat trimmed off. Fish and seafood. Egg whites. Dried beans, peas, or lentils. Unsalted nuts, nut butters, and seeds. Unsalted canned beans. Lean cuts of beef with fat trimmed off. Low-sodium, lean deli meat. Dairy Low-fat (1%) or fat-free (skim) milk. Fat-free, low-fat, or reduced-fat cheeses. Nonfat, low-sodium ricotta or cottage cheese. Low-fat or nonfat yogurt. Low-fat, low-sodium cheese. Fats and oils Soft margarine without trans fats. Vegetable oil. Low-fat, reduced-fat, or light mayonnaise and salad dressings (reduced-sodium). Canola, safflower, olive, soybean, and sunflower oils. Avocado. Seasoning and other foods Herbs. Spices. Seasoning mixes without salt. Unsalted popcorn and pretzels. Fat-free sweets. What foods are not recommended? The items listed may not be a complete list. Talk with your dietitian about what dietary choices are best for you. Grains Baked goods made with fat, such as croissants, muffins, or some breads. Dry pasta or rice meal packs. Vegetables Creamed or fried vegetables. Vegetables in a cheese sauce. Regular canned vegetables (not low-sodium or reduced-sodium). Regular canned tomato sauce and paste (not low-sodium or reduced-sodium). Regular tomato and vegetable juice (not low-sodium or reduced-sodium). Pickles. Olives. Fruits Canned fruit in a light or heavy syrup. Fried fruit. Fruit in cream or butter sauce. Meat and other protein foods Fatty cuts of meat. Ribs. Fried meat. Bacon. Sausage. Bologna and other processed lunch meats. Salami. Fatback. Hotdogs. Bratwurst. Salted nuts and seeds. Canned beans with added salt. Canned or smoked fish. Whole eggs or egg yolks. Chicken or turkey with skin. Dairy Whole or 2% milk, cream, and half-and-half. Whole or full-fat cream cheese. Whole-fat or sweetened yogurt. Full-fat cheese. Nondairy creamers. Whipped toppings.  Processed cheese and cheese spreads. Fats and oils Butter. Stick margarine. Lard. Shortening. Ghee. Bacon fat. Tropical oils, such as coconut, palm kernel, or palm oil. Seasoning and other foods Salted popcorn and pretzels. Onion salt, garlic salt, seasoned salt, table salt, and sea salt. Worcestershire sauce. Tartar sauce. Barbecue sauce. Teriyaki sauce. Soy sauce, including reduced-sodium. Steak sauce. Canned and packaged gravies. Fish sauce. Oyster sauce. Cocktail sauce. Horseradish that you find on the shelf. Ketchup. Mustard. Meat flavorings and tenderizers. Bouillon cubes. Hot sauce and Tabasco sauce. Premade or packaged marinades. Premade or packaged taco seasonings. Relishes. Regular salad dressings. Where to find more information:  National Heart, Lung, and Blood Institute: www.nhlbi.nih.gov  American Heart Association: www.heart.org Summary  The DASH eating plan is a healthy eating plan that has been shown to reduce high blood pressure (hypertension). It may also reduce your risk for type 2 diabetes, heart disease, and stroke.  With the DASH eating plan, you should limit salt (sodium) intake to 2,300 mg a day. If you have hypertension, you may need to reduce your sodium intake to 1,500 mg a day.  When on the DASH eating plan, aim to eat more fresh fruits and vegetables, whole grains, lean proteins, low-fat dairy, and heart-healthy fats.  Work with your health care provider or diet and nutrition specialist (dietitian) to adjust your eating plan to your   individual calorie needs. This information is not intended to replace advice given to you by your health care provider. Make sure you discuss any questions you have with your health care provider. Document Revised: 08/28/2017 Document Reviewed: 09/08/2016 Elsevier Patient Education  2020 [employer]  

## 2020-06-12 NOTE — Progress Notes (Signed)
Subjective: CC: establish care PCP: Gwenlyn Perking, FNP  [id number] B [name] is a 74 y.o. female presenting to clinic today for:  1.  Hyperlipidemia Reports she stopped taking Lipitor in July. She has been improving her diet since then by eating more fruits and vegetables. She has also cut out fried food. She walks 2-3 x a week for 20 minutes. She does not wish to be on a statin.   2. Migraines Reports 1 migraine a month, sometimes with nausea. Imitrex eases the pain so that it is tolerable. Denies side effects.   3. Lumber DDD She sees pain management every 3 months for pain medication and injections in her back. Reports that the combination usually makes the pain tolerable.   4. AAA Sees [facility] (Dr. Carlis Abbott) for [facility]. Has yearly ultrasound done, usually in January.   Denies any concerns today.  Relevant past medical, surgical, family, and social history reviewed and updated as indicated.  Allergies and medications reviewed and updated.  Allergies  Allergen Reactions   Bee Venom     Other reaction(s): Redness   Cephalexin Other (See Comments)    Headaches   Gabapentin Diarrhea   Past Medical History:  Diagnosis Date   AAA (abdominal aortic aneurysm) ([place])    saw Dr Kellie Simmering 11/2013.  2.7 cm   Arthritis    Chicken pox    COPD (chronic obstructive pulmonary disease) ([facility])    smokes 1/2ppd   DDD (degenerative disc disease), lumbar    GERD (gastroesophageal reflux disease)    diet controlled   History of blood transfusion    History of lumbar fusion 08/09/2014   Hyperlipidemia    IBS (irritable bowel syndrome)    diet contolled   Mild mitral regurgitation    RBBB    S/P arthroscopy of right shoulder 12/15/2017   Seasonal allergies    Varicose veins     Current Outpatient Medications:    atorvastatin (LIPITOR) 10 MG tablet, TAKE 1 TABLET DAILY, Disp: 90 tablet, Rfl: 0   fluticasone (FLONASE) 50 MCG/ACT nasal spray, Place 2  sprays into both nostrils daily., Disp: , Rfl:    HYDROcodone-acetaminophen (NORCO) 10-325 MG tablet, Take 1 tablet by mouth 3 (three) times daily as needed., Disp: , Rfl:    loratadine (CLARITIN) 10 MG tablet, Take 10 mg by mouth daily., Disp: , Rfl:    montelukast (SINGULAIR) 10 MG tablet, Take 1 tablet (10 mg total) by mouth at bedtime., Disp: 90 tablet, Rfl: 1   ondansetron (ZOFRAN ODT) 4 MG disintegrating tablet, Take 1 tablet (4 mg total) by mouth every 8 (eight) hours as needed for nausea or vomiting., Disp: 20 tablet, Rfl: 0   SUMAtriptan (IMITREX) 50 MG tablet, Take 1 tablet (50 mg total) by mouth every 2 (two) hours as needed for migraine. May repeat in 2 hours if headache persists or recurs., Disp: 10 tablet, Rfl: 0 Social History   Socioeconomic History   Marital status: Legally Separated    Spouse name: Not on file   Number of children: Not on file   Years of education: Not on file   Highest education level: Not on file  Occupational History   Not on file  Tobacco Use   Smoking status: Current Every Day Smoker    Packs/day: 0.50    Years: 30.00    Pack years: 15.00    Types: Cigarettes   Smokeless tobacco: Never Used  Vaping Use   Vaping Use: Never used  Substance and Sexual Activity   Alcohol use: Not Currently    Alcohol/week: 0.0 standard drinks    Comment: very occasional, not monthly   Drug use: No   Sexual activity: Not Currently  Other Topics Concern   Not on file  Social History Narrative   Not on file   Social Determinants of Health   Financial Resource Strain:    Difficulty of Paying Living Expenses: Not on file  Food Insecurity:    Worried About [place] in the Last Year: Not on file   Ran Out of Food in the Last Year: Not on file  Transportation Needs:    Lack of Transportation (Medical): Not on file   Lack of Transportation (Non-Medical): Not on file  Physical Activity:    Days of Exercise per Week: Not on  file   Minutes of Exercise per Session: Not on file  Stress:    Feeling of Stress : Not on file  Social Connections:    Frequency of Communication with Friends and Family: Not on file   Frequency of Social Gatherings with Friends and Family: Not on file   Attends Religious Services: Not on file   Active Member of Clubs or Organizations: Not on file   Attends Archivist Meetings: Not on file   Marital Status: Not on file  Intimate Partner Violence:    Fear of Current or Ex-Partner: Not on file   Emotionally Abused: Not on file   Physically Abused: Not on file   Sexually Abused: Not on file   Family History  Problem Relation Age of Onset   Early death Mother        aortic aneurysm   Vasculitis Mother        temporal arteritis   Aortic aneurysm Mother    Heart disease Mother        before age 62   Hyperlipidemia Mother    Hypertension Mother    COPD Sister    Hypertension Sister    Depression Sister    Heart disease Sister        before age 110   Post-traumatic stress disorder Sister    Bipolar disorder Sister    Brain cancer Maternal Aunt 84       brain   Heart disease Maternal Grandmother        aortic aneurysm   Aortic aneurysm Maternal Grandmother    Hypertension Sister    Throat cancer Maternal Grandfather    Breast cancer Neg Hx     Review of Systems  Constitutional: Negative for fatigue, fever and unexpected weight change.  HENT: Negative for trouble swallowing.   Eyes: Negative for visual disturbance.  Respiratory: Negative for shortness of breath.   Cardiovascular: Negative for chest pain, palpitations and leg swelling.  Gastrointestinal: Negative for abdominal pain, diarrhea, nausea and vomiting.  Genitourinary: Negative for difficulty urinating.  Musculoskeletal: Negative for joint swelling.  Skin: Negative for color change and rash.  Neurological: Negative for dizziness, syncope and weakness.    Psychiatric/Behavioral: Negative for behavioral problems and confusion.     Objective: Office vital signs reviewed. BP 118/75    Pulse 93    Temp 98.4 F (36.9 C) (Temporal)    Ht _0  (1.676 m)    Wt 119 lb 3.2 oz (54.1 kg)    SpO2 97%    BMI 19.24 kg/m   Physical Examination:  Physical Exam Vitals and nursing note reviewed.  Constitutional:  General: She is not in acute distress.    Appearance: Normal appearance. She is not ill-appearing or diaphoretic.  Eyes:     Extraocular Movements: Extraocular movements intact.     Pupils: Pupils are equal, round, and reactive to light.  Cardiovascular:     Rate and Rhythm: Normal rate and regular rhythm.     Pulses: Normal pulses.     Heart sounds: Normal heart sounds. No murmur heard.   Pulmonary:     Effort: Pulmonary effort is normal.     Breath sounds: Normal breath sounds.  Abdominal:     General: Abdomen is flat. There is no distension.     Palpations: Abdomen is soft. There is no mass.     Tenderness: There is no abdominal tenderness. There is no guarding or rebound.  Musculoskeletal:     Right lower leg: No edema.     Left lower leg: No edema.  Skin:    General: Skin is warm and dry.     Capillary Refill: Capillary refill takes less than 2 seconds.  Neurological:     General: No focal deficit present.     Mental Status: She is alert and oriented to person, place, and time.  Psychiatric:        Behavior: Behavior normal.      Results for orders placed or performed in visit on 04/10/20  CMP14+EGFR  Result Value Ref Range   Glucose 89 65 - 99 mg/dL   BUN 8 8 - 27 mg/dL   Creatinine, Ser 0.73 0.57 - 1.00 mg/dL   GFR calc non Af Amer 81 >59 mL/min/1.73   GFR calc Af Amer 93 >59 mL/min/1.73   BUN/Creatinine Ratio 11 (L) 12 - 28   Sodium 139 134 - 144 mmol/L   Potassium 4.2 3.5 - 5.2 mmol/L   Chloride 99 96 - 106 mmol/L   CO2 24 20 - 29 mmol/L   Calcium 9.2 8.7 - 10.3 mg/dL   Total Protein 6.6 6.0 - 8.5 g/dL    Albumin 4.4 3.7 - 4.7 g/dL   Globulin, Total 2.2 1.5 - 4.5 g/dL   Albumin/Globulin Ratio 2.0 1.2 - 2.2   Bilirubin Total 0.4 0.0 - 1.2 mg/dL   Alkaline Phosphatase 109 48 - 121 IU/L   AST 22 0 - 40 IU/L   ALT 11 0 - 32 IU/L  Lipid panel  Result Value Ref Range   Cholesterol, Total 167 100 - 199 mg/dL   Triglycerides 92 0 - 149 mg/dL   HDL 58 >39 mg/dL   VLDL Cholesterol Cal 17 5 - 40 mg/dL   LDL Chol Calc (NIH) 92 0 - 99 mg/dL   Chol/HDL Ratio 2.9 0.0 - 4.4 ratio     Assessment/ Plan: Onya was seen today for establish care and hyperlipidemia.  Diagnoses and all orders for this visit:  Mixed hyperlipidemia Dash diet handout provided. Continue heart healthy diet and exercise as tolerated. Will check lipid panel in January with CPE.   AAA (abdominal aortic aneurysm) without rupture ([place]) Continue follow up with [facility].   Migraine without aura and without status migrainosus, not intractable Imitrex refilled today. Return for worsening symptoms.  -     SUMAtriptan (IMITREX) 50 MG tablet; Take 1 tablet (50 mg total) by mouth every 2 (two) hours as needed for migraine. May repeat in 2 hours if headache persists or recurs.  Lumbar degenerative disc disease Continue follow up with pain management  Return around 10/13/19 for  CPE and lab work or sooner for worsening symptoms.   The above assessment and management plan was discussed with the patient. The patient verbalized understanding of and has agreed to the management plan. Patient is aware to call the clinic if symptoms persist or worsen. Patient is aware when to return to the clinic for a follow-up visit. Patient educated on when it is appropriate to go to the emergency department.   Marjorie Smolder, FNP-C [place] [address] [place], [place] [address] [phone]

## 2020-07-11 ENCOUNTER — Telehealth: Payer: Self-pay

## 2020-07-11 ENCOUNTER — Ambulatory Visit: Payer: Medicare Other | Admitting: Family Medicine

## 2020-07-23 DIAGNOSIS — M47817 Spondylosis without myelopathy or radiculopathy, lumbosacral region: Secondary | ICD-10-CM | POA: Diagnosis not present

## 2020-08-13 DIAGNOSIS — M47817 Spondylosis without myelopathy or radiculopathy, lumbosacral region: Secondary | ICD-10-CM | POA: Diagnosis not present

## 2020-08-22 DIAGNOSIS — L821 Other seborrheic keratosis: Secondary | ICD-10-CM | POA: Diagnosis not present

## 2020-08-22 DIAGNOSIS — L820 Inflamed seborrheic keratosis: Secondary | ICD-10-CM | POA: Diagnosis not present

## 2020-08-22 DIAGNOSIS — L570 Actinic keratosis: Secondary | ICD-10-CM | POA: Diagnosis not present

## 2020-09-06 ENCOUNTER — Other Ambulatory Visit: Payer: Self-pay | Admitting: *Deleted

## 2020-09-06 DIAGNOSIS — I714 Abdominal aortic aneurysm, without rupture, unspecified: Secondary | ICD-10-CM

## 2020-09-18 ENCOUNTER — Other Ambulatory Visit (HOSPITAL_COMMUNITY): Payer: Medicare Other

## 2020-09-18 ENCOUNTER — Ambulatory Visit (HOSPITAL_COMMUNITY)
Admission: RE | Admit: 2020-09-18 | Discharge: 2020-09-18 | Disposition: A | Discharge status: Home / Self Care | Payer: Medicare Other | Source: Ambulatory Visit | Attending: Vascular Surgery | Admitting: Vascular Surgery

## 2020-09-18 ENCOUNTER — Other Ambulatory Visit: Payer: Self-pay

## 2020-09-18 ENCOUNTER — Ambulatory Visit (INDEPENDENT_AMBULATORY_CARE_PROVIDER_SITE_OTHER): Payer: Medicare Other | Admitting: Vascular Surgery

## 2020-09-18 ENCOUNTER — Encounter: Payer: Self-pay | Admitting: Vascular Surgery

## 2020-09-18 VITALS — BP 138/83 | HR 76 | Temp 98.0°F | Resp 16 | Ht 66.0 in | Wt 116.0 lb

## 2020-09-18 DIAGNOSIS — I714 Abdominal aortic aneurysm, without rupture, unspecified: Secondary | ICD-10-CM

## 2020-09-18 NOTE — Progress Notes (Signed)
Patient name: [name] MRN: [id number] DOB: 12-30-1945 Sex: female  REASON FOR VISIT: 1 year follow-up for abdominal aortic aneurysm and common iliac artery surveillance  HPI: [name] is a 74 y.o. female with history of hyperlipidemia as well as tobacco abuse presents for ongoing surveillance of her abdominal aortic aneurysm and small celiac aneurysm.  She was previously followed by Dr. Kellie Simmering for a 2.7 cm AAA initially diagnosed in 2015.  Last seen in our clinic last year when her AAA was 3.6 cm and right and left iliac arteries were 1.8 and 1.5 cm.  She reports no significant changes to her health over the last year.  She has decreased her smoking to 6 to 7 cigarettes a day.  She does have chronic back pain and goes to a pain clinic and states this is unchanged.     Past Medical History:  Diagnosis Date  . AAA (abdominal aortic aneurysm) ([place])    saw Dr Kellie Simmering 11/2013.  2.7 cm  . Arthritis   . Chicken pox   . COPD (chronic obstructive pulmonary disease) ([place])    smokes 1/2ppd  . DDD (degenerative disc disease), lumbar   . GERD (gastroesophageal reflux disease)    diet controlled  . History of blood transfusion   . History of lumbar fusion 08/09/2014  . Hyperlipidemia   . IBS (irritable bowel syndrome)    diet contolled  . Mild mitral regurgitation   . RBBB   . S/P arthroscopy of right shoulder 12/15/2017  . Seasonal allergies   . Varicose veins     Past Surgical History:  Procedure Laterality Date  . APPENDECTOMY    . BACK SURGERY N/A [phone]   5 back surgeries  . BREAST BIOPSY  1989,05   lt br bx  . BREAST EXCISIONAL BIOPSY Bilateral   . CATARACT EXTRACTION W/ INTRAOCULAR LENS IMPLANT Bilateral 2018  . CHOLECYSTECTOMY    . EXCISION/RELEASE BURSA HIP Right 05/12/2013   Procedure: EXCISION RIGHT TROCHANTERIC BURSA/CALCIFICATION ;  Surgeon: Ninetta Lights, MD;  Location: [facility];  Service: Orthopedics;  Laterality: Right;  .  FASCIOTOMY Right 05/12/2013   Procedure: RIGHT FASCIOTOMY HIP ANY TYPE;  Surgeon: Ninetta Lights, MD;  Location: [facility];  Service: Orthopedics;  Laterality: Right;  . LARYNGOSCOPY AND BRONCHOSCOPY N/A 08/15/2019   Procedure: BRONCHOSCOPY/LARYNGOSCOPY;  Surgeon: Leta Baptist, MD;  Location: [facility];  Service: ENT;  Laterality: N/A;  . NM MYOCAR PERF WALL MOTION  04/13/2003   negative  . SHOULDER ARTHROSCOPY WITH OPEN ROTATOR CUFF REPAIR Right 2018  . TONSILLECTOMY AND ADENOIDECTOMY  1950  . TOTAL HIP ARTHROPLASTY Left 2006   lt total hip  . TOTAL HIP ARTHROPLASTY Right 2011  . TOTAL HIP ARTHROPLASTY Right 2016   second surgery due to recall  . US ECHOCARDIOGRAPHY  04/13/2003   EF 65%, mild TR w/suggestion of mild pulmonary hypertension,thickened anterior leaflet,central mild MR.  Marland Kitchen VAGINAL HYSTERECTOMY  1993    Family History  Problem Relation Age of Onset  . Early death Mother        aortic aneurysm  . Vasculitis Mother        temporal arteritis  . Aortic aneurysm Mother   . Heart disease Mother        before age 61  . Hyperlipidemia Mother   . Hypertension Mother   . COPD Sister   . Hypertension Sister   . Depression Sister   . Heart  disease Sister        before age 30  . Post-traumatic stress disorder Sister   . Bipolar disorder Sister   . Brain cancer Maternal Aunt 84       brain  . Heart disease Maternal Grandmother        aortic aneurysm  . Aortic aneurysm Maternal Grandmother   . Hypertension Sister   . Throat cancer Maternal Grandfather   . Breast cancer Neg Hx     SOCIAL HISTORY: Social History   Tobacco Use  . Smoking status: Current Every Day Smoker    Packs/day: 0.50    Years: 30.00    Pack years: 15.00    Types: Cigarettes  . Smokeless tobacco: Never Used  Substance Use Topics  . Alcohol use: Not Currently    Alcohol/week: 0.0 standard drinks    Comment: very occasional, not monthly    Allergies  Allergen  Reactions  . Bee Venom     Other reaction(s): Redness  . Cephalexin Other (See Comments)    Headaches  . Gabapentin Diarrhea    Current Outpatient Medications  Medication Sig Dispense Refill  . fluticasone (FLONASE) 50 MCG/ACT nasal spray Place 2 sprays into both nostrils daily.    Marland Kitchen HYDROcodone-acetaminophen (NORCO) 10-325 MG tablet Take 1 tablet by mouth 3 (three) times daily as needed.    . loratadine (CLARITIN) 10 MG tablet Take 10 mg by mouth daily.    . montelukast (SINGULAIR) 10 MG tablet Take 1 tablet (10 mg total) by mouth at bedtime. 90 tablet 1  . ondansetron (ZOFRAN ODT) 4 MG disintegrating tablet Take 1 tablet (4 mg total) by mouth every 8 (eight) hours as needed for nausea or vomiting. 20 tablet 0  . SUMAtriptan (IMITREX) 50 MG tablet Take 1 tablet (50 mg total) by mouth every 2 (two) hours as needed for migraine. May repeat in 2 hours if headache persists or recurs. 10 tablet 3   No current facility-administered medications for this visit.    REVIEW OF SYSTEMS:  [X]  denotes positive finding, [ ]  denotes negative finding Cardiac  Comments:  Chest pain or chest pressure:    Shortness of breath upon exertion:    Short of breath when lying flat:    Irregular heart rhythm:        Vascular    Pain in calf, thigh, or hip brought on by ambulation:    Pain in feet at night that wakes you up from your sleep:     Blood clot in your veins:    Leg swelling:         Pulmonary    Oxygen at home:    Productive cough:     Wheezing:         Neurologic    Sudden weakness in arms or legs:     Sudden numbness in arms or legs:     Sudden onset of difficulty speaking or slurred speech:    Temporary loss of vision in one eye:     Problems with dizziness:         Gastrointestinal    Blood in stool:     Vomited blood:         Genitourinary    Burning when urinating:     Blood in urine:        Psychiatric    Major depression:         Hematologic    Bleeding problems:     Problems with blood clotting  too easily:        Skin    Rashes or ulcers:        Constitutional    Fever or chills:      PHYSICAL EXAM: Vitals:   09/18/20 1035  BP: 138/83  Pulse: 76  Resp: 16  Temp: 98 F (36.7 C)  TempSrc: Temporal  SpO2: 95%  Weight: 116 lb (52.6 kg)  Height: 5\' 6"  (1.676 m)    GENERAL: The patient is a well-nourished female, in no acute distress. The vital signs are documented above. CARDIAC: There is a regular rate and rhythm.  VASCULAR:  2+ femoral pulses palpable bilaterally Right DP and left PT palpable PULMONARY: No respiratory distress. ABDOMEN:   No pain with deep palpation of her abdomen. MUSCULOSKELETAL: There are no major deformities or cyanosis. NEUROLOGIC: No focal weakness or paresthesias are detected. SKIN: There are no ulcers or rashes noted. PSYCHIATRIC: The patient has a normal affect.  DATA:   Duplex today suggest that her abdominal aortic aneurysm has grown from 3.6 cm to 3.9 cm.  The celiac artery has grown from 1.56 cm to 1.76 cm.  There is a elevated velocity in the celiac artery.  SMA patent.  The right and left common iliac arteries are 1.4 and 1.1 cm with no growth.  Assessment/Plan:  74 year old female has been followed for known abdominal aortic aneurysm as well as common iliac artery aneurysms.  Her aneurysm has shown some growth over the last year and now measures 3.9 cm from 3.6 cm (1 year ago).  She has no pain with palpation of her aneurysm in clinic.  Discussed that in the absence of symptoms guidelines are still to repair AAA at greater than 5 cm.  I recommend a follow-up again in 1 year with another AAA duplex here in the office.  Both of her common iliac arteries measure essentially the same.  She does have a small celiac aneurysm that has grown from 1.56 to 1.76 cm by duplex.  We will continue to monitor this with yearly duplex studies as well. Discussed the importance of smoking cessation.  Marty Heck,  MD [facility] Office: [place]

## 2020-10-11 ENCOUNTER — Encounter: Payer: Medicare Other | Admitting: Family Medicine

## 2020-10-12 ENCOUNTER — Encounter: Payer: Medicare Other | Admitting: Family Medicine

## 2020-10-16 ENCOUNTER — Encounter: Payer: Medicare Other | Admitting: Family Medicine

## 2020-10-24 ENCOUNTER — Other Ambulatory Visit: Payer: Self-pay

## 2020-10-24 ENCOUNTER — Ambulatory Visit
Admission: RE | Admit: 2020-10-24 | Discharge: 2020-10-24 | Disposition: A | Discharge status: Home / Self Care | Payer: Medicare Other | Source: Ambulatory Visit | Attending: Family Medicine | Admitting: Family Medicine

## 2020-10-24 DIAGNOSIS — Z1231 Encounter for screening mammogram for malignant neoplasm of breast: Secondary | ICD-10-CM

## 2020-10-25 DIAGNOSIS — M47817 Spondylosis without myelopathy or radiculopathy, lumbosacral region: Secondary | ICD-10-CM | POA: Diagnosis not present

## 2020-10-31 ENCOUNTER — Encounter: Payer: Medicare Other | Admitting: Family Medicine

## 2020-11-08 ENCOUNTER — Encounter: Payer: Self-pay | Admitting: Family Medicine

## 2020-11-08 ENCOUNTER — Other Ambulatory Visit: Payer: Self-pay

## 2020-11-08 ENCOUNTER — Ambulatory Visit (INDEPENDENT_AMBULATORY_CARE_PROVIDER_SITE_OTHER): Payer: Medicare Other | Admitting: Family Medicine

## 2020-11-08 VITALS — BP 110/66 | HR 101 | Temp 98.0°F | Ht 66.0 in | Wt 114.0 lb

## 2020-11-08 DIAGNOSIS — G43009 Migraine without aura, not intractable, without status migrainosus: Secondary | ICD-10-CM | POA: Diagnosis not present

## 2020-11-08 DIAGNOSIS — Z0000 Encounter for general adult medical examination without abnormal findings: Secondary | ICD-10-CM

## 2020-11-08 DIAGNOSIS — Z5320 Procedure and treatment not carried out because of patient's decision for unspecified reasons: Secondary | ICD-10-CM | POA: Insufficient documentation

## 2020-11-08 DIAGNOSIS — F17200 Nicotine dependence, unspecified, uncomplicated: Secondary | ICD-10-CM | POA: Diagnosis not present

## 2020-11-08 DIAGNOSIS — I714 Abdominal aortic aneurysm, without rupture, unspecified: Secondary | ICD-10-CM

## 2020-11-08 DIAGNOSIS — J302 Other seasonal allergic rhinitis: Secondary | ICD-10-CM | POA: Diagnosis not present

## 2020-11-08 DIAGNOSIS — Z0001 Encounter for general adult medical examination with abnormal findings: Secondary | ICD-10-CM | POA: Diagnosis not present

## 2020-11-08 DIAGNOSIS — E785 Hyperlipidemia, unspecified: Secondary | ICD-10-CM | POA: Diagnosis not present

## 2020-11-08 MED ORDER — SUMATRIPTAN SUCCINATE 50 MG PO TABS: 50.0000 mg | ORAL_TABLET | ORAL | 3 refills | Status: DC | PRN

## 2020-11-08 MED ORDER — MONTELUKAST SODIUM 10 MG PO TABS: 10.0000 mg | ORAL_TABLET | Freq: Every day | ORAL | 3 refills | Status: DC

## 2020-11-08 MED ORDER — ONDANSETRON 4 MG PO TBDP: 4.0000 mg | ORAL_TABLET | Freq: Three times a day (TID) | ORAL | 2 refills | Status: DC | PRN

## 2020-11-08 NOTE — Progress Notes (Signed)
[name] is a 75 y.o. female presents to office today for annual physical exam examination.    Concerns today include: 1. None  Occupation: retired, Marital status: divorced, Substance use: tobacco, not ready to quit Diet: heart healthy, Exercise: walks daily Last eye exam: within last year Last dental exam: 3x a year Last mammogram: 10/24/20 Refills needed today: singular, zofran, imitrex Immunizations needed: UTD  [name] was seen by her vascular surgeon in December and had a AAA duplex done. She will follow up for a repeat in 1 year.   Past Medical History:  Diagnosis Date  . AAA (abdominal aortic aneurysm) ([facility])    saw Dr Kellie Simmering 11/2013.  2.7 cm  . Arthritis   . Chicken pox   . COPD (chronic obstructive pulmonary disease) ([facility])    smokes 1/2ppd  . DDD (degenerative disc disease), lumbar   . GERD (gastroesophageal reflux disease)    diet controlled  . History of blood transfusion   . History of lumbar fusion 08/09/2014  . Hyperlipidemia   . IBS (irritable bowel syndrome)    diet contolled  . Mild mitral regurgitation   . RBBB   . S/P arthroscopy of right shoulder 12/15/2017  . Seasonal allergies   . Varicose veins    Social History   Socioeconomic History  . Marital status: Legally Separated    Spouse name: Not on file  . Number of children: Not on file  . Years of education: Not on file  . Highest education level: Not on file  Occupational History  . Not on file  Tobacco Use  . Smoking status: Current Every Day Smoker    Packs/day: 0.50    Years: 30.00    Pack years: 15.00    Types: Cigarettes  . Smokeless tobacco: Never Used  Vaping Use  . Vaping Use: Never used  Substance and Sexual Activity  . Alcohol use: Not Currently    Alcohol/week: 0.0 standard drinks    Comment: very occasional, not monthly  . Drug use: No  . Sexual activity: Not Currently  Other Topics Concern  . Not on file  Social History Narrative  . Not on file   Social  Determinants of Health   Financial Resource Strain: Not on file  Food Insecurity: Not on file  Transportation Needs: Not on file  Physical Activity: Not on file  Stress: Not on file  Social Connections: Not on file  Intimate Partner Violence: Not on file   Past Surgical History:  Procedure Laterality Date  . APPENDECTOMY    . BACK SURGERY N/A [phone]   5 back surgeries  . BREAST BIOPSY  1989,05   lt br bx  . BREAST EXCISIONAL BIOPSY Bilateral   . CATARACT EXTRACTION W/ INTRAOCULAR LENS IMPLANT Bilateral 2018  . CHOLECYSTECTOMY    . EXCISION/RELEASE BURSA HIP Right 05/12/2013   Procedure: EXCISION RIGHT TROCHANTERIC BURSA/CALCIFICATION ;  Surgeon: Ninetta Lights, MD;  Location: [facility];  Service: Orthopedics;  Laterality: Right;  . FASCIOTOMY Right 05/12/2013   Procedure: RIGHT FASCIOTOMY HIP ANY TYPE;  Surgeon: Ninetta Lights, MD;  Location: [facility];  Service: Orthopedics;  Laterality: Right;  . LARYNGOSCOPY AND BRONCHOSCOPY N/A 08/15/2019   Procedure: BRONCHOSCOPY/LARYNGOSCOPY;  Surgeon: Leta Baptist, MD;  Location: [facility];  Service: ENT;  Laterality: N/A;  . NM MYOCAR PERF WALL MOTION  04/13/2003   negative  . SHOULDER ARTHROSCOPY WITH OPEN ROTATOR CUFF REPAIR Right 2018  .  skin cancer removed right leg    . TONSILLECTOMY AND ADENOIDECTOMY  1950  . TOTAL HIP ARTHROPLASTY Left 2006   lt total hip  . TOTAL HIP ARTHROPLASTY Right 2011  . TOTAL HIP ARTHROPLASTY Right 2016   second surgery due to recall  . US ECHOCARDIOGRAPHY  04/13/2003   EF 65%, mild TR w/suggestion of mild pulmonary hypertension,thickened anterior leaflet,central mild MR.  [facility] VAGINAL HYSTERECTOMY  1993   Family History  Problem Relation Age of Onset  . Early death Mother        aortic aneurysm  . Vasculitis Mother        temporal arteritis  . Aortic aneurysm Mother   . Heart disease Mother        before age 58  . Hyperlipidemia Mother   .  Hypertension Mother   . COPD Sister   . Hypertension Sister   . Depression Sister   . Heart disease Sister        before age 78  . Post-traumatic stress disorder Sister   . Bipolar disorder Sister   . Brain cancer Maternal Aunt 84       brain  . Heart disease Maternal Grandmother        aortic aneurysm  . Aortic aneurysm Maternal Grandmother   . Hypertension Sister   . Throat cancer Maternal Grandfather   . Breast cancer Neg Hx     Current Outpatient Medications:  .  fluticasone (FLONASE) 50 MCG/ACT nasal spray, Place 2 sprays into both nostrils daily., Disp: , Rfl:  .  HYDROcodone-acetaminophen (NORCO) 10-325 MG tablet, Take 1 tablet by mouth 3 (three) times daily as needed., Disp: , Rfl:  .  loratadine (CLARITIN) 10 MG tablet, Take 10 mg by mouth daily., Disp: , Rfl:  .  montelukast (SINGULAIR) 10 MG tablet, Take 1 tablet (10 mg total) by mouth at bedtime., Disp: 90 tablet, Rfl: 1 .  ondansetron (ZOFRAN ODT) 4 MG disintegrating tablet, Take 1 tablet (4 mg total) by mouth every 8 (eight) hours as needed for nausea or vomiting., Disp: 20 tablet, Rfl: 0 .  SUMAtriptan (IMITREX) 50 MG tablet, Take 1 tablet (50 mg total) by mouth every 2 (two) hours as needed for migraine. May repeat in 2 hours if headache persists or recurs., Disp: 10 tablet, Rfl: 3  Allergies  Allergen Reactions  . Bee Venom     Other reaction(s): Redness  . Cephalexin Other (See Comments)    Headaches  . Gabapentin Diarrhea     ROS: Review of Systems Pertinent items noted in HPI and remainder of comprehensive ROS otherwise negative.    Physical exam BP 110/66   Pulse (!) 101   Temp 98 F (36.7 C)   Ht 5' 6"  (1.676 m)   Wt 114 lb (51.7 kg)   SpO2 97%   BMI 18.40 kg/m  General appearance: alert, cooperative and no distress Head: Normocephalic, without obvious abnormality, atraumatic Eyes: conjunctivae/corneas clear. PERRL, EOM's intact. Fundi benign. Ears: normal TM's and external ear canals both  ears Nose: Nares normal. Septum midline. Mucosa normal. No drainage or sinus tenderness. Throat: lips, mucosa, and tongue normal; teeth and gums normal Neck: no adenopathy, no carotid bruit, no JVD, supple, symmetrical, trachea midline and thyroid not enlarged, symmetric, no tenderness/mass/nodules Lungs: clear to auscultation bilaterally Heart: regular rate and rhythm, S1, S2 normal, no murmur, click, rub or gallop Abdomen: soft, non-tender; bowel sounds normal; no masses,  no organomegaly Extremities: extremities normal, atraumatic, no cyanosis or  edema Pulses: 2+ and symmetric Skin: Skin color, texture, turgor normal. No rashes or lesions Lymph nodes: Cervical, supraclavicular, and axillary nodes normal. Neurologic: Alert and oriented X 3, normal strength and tone. Normal symmetric reflexes. Normal coordination and gait    Assessment/ Plan:  [name] was seen today for annual exam.  Diagnoses and all orders for this visit:  Routine general medical examination at a health care facility Labs pending as below.  -     CBC with Differential/Platelet -     CMP14+EGFR -     Lipid panel -     TSH  Smoker unmotivated to quit Not ready to quit.   Seasonal allergies Refill provided. Well controlled on current regimen.  -     montelukast (SINGULAIR) 10 MG tablet; Take 1 tablet (10 mg total) by mouth at bedtime.  Migraine without aura and without status migrainosus, not intractable Refills provided. Migraines are infrequent and stress related. Well controlled on current regimen.  -     ondansetron (ZOFRAN ODT) 4 MG disintegrating tablet; Take 1 tablet (4 mg total) by mouth every 8 (eight) hours as needed for nausea or vomiting. -     SUMAtriptan (IMITREX) 50 MG tablet; Take 1 tablet (50 mg total) by mouth every 2 (two) hours as needed for migraine. May repeat in 2 hours if headache persists or recurs.  AAA (abdominal aortic aneurysm) without rupture ([facility]) Managed by vascular surgery.  Last AAA duplex 12/21.  Counseled on healthy lifestyle choices, including diet ([place] in fruits, vegetables and lean meats and low in salt and simple carbohydrates) and exercise (at least 30 minutes of moderate physical activity daily).  Patient to follow up in 1 year for annual exam or sooner if needed. Follow up in 6 months for chronic follow up.   The above assessment and management plan was discussed with the patient. The patient verbalized understanding of and has agreed to the management plan. Patient is aware to call the clinic if symptoms persist or worsen. Patient is aware when to return to the clinic for a follow-up visit. Patient educated on when it is appropriate to go to the emergency department.   Marjorie Smolder, FNP-C [facility] [address] [place], [place] [address] [phone]

## 2020-11-08 NOTE — Patient Instructions (Signed)
Health Maintenance, Female Adopting a healthy lifestyle and getting preventive care are important in promoting health and wellness. Ask your health care provider about:  The right schedule for you to have regular tests and exams.  Things you can do on your own to prevent diseases and keep yourself healthy. What should I know about diet, weight, and exercise? Eat a healthy diet  Eat a diet that includes plenty of vegetables, fruits, low-fat dairy products, and lean protein.  Do not eat a lot of foods that are high in solid fats, added sugars, or sodium.   Maintain a healthy weight Body mass index (BMI) is used to identify weight problems. It estimates body fat based on height and weight. Your health care provider can help determine your BMI and help you achieve or maintain a healthy weight. Get regular exercise Get regular exercise. This is one of the most important things you can do for your health. Most adults should:  Exercise for at least 150 minutes each week. The exercise should increase your heart rate and make you sweat (moderate-intensity exercise).  Do strengthening exercises at least twice a week. This is in addition to the moderate-intensity exercise.  Spend less time sitting. Even light physical activity can be beneficial. Watch cholesterol and blood lipids Have your blood tested for lipids and cholesterol at 77 years of age, then have this test every 5 years. Have your cholesterol levels checked more often if:  Your lipid or cholesterol levels are high.  You are older than 77 years of age.  You are at high risk for heart disease. What should I know about cancer screening? Depending on your health history and family history, you may need to have cancer screening at various ages. This may include screening for:  Breast cancer.  Cervical cancer.  Colorectal cancer.  Skin cancer.  Lung cancer. What should I know about heart disease, diabetes, and high blood  pressure? Blood pressure and heart disease  High blood pressure causes heart disease and increases the risk of stroke. This is more likely to develop in people who have high blood pressure readings, are of African descent, or are overweight.  Have your blood pressure checked: ? Every 3-5 years if you are 77-83 years of age. ? Every year if you are 77 years old or older. Diabetes Have regular diabetes screenings. This checks your fasting blood sugar level. Have the screening done:  Once every three years after age 77 if you are at a normal weight and have a low risk for diabetes.  More often and at a younger age if you are overweight or have a high risk for diabetes. What should I know about preventing infection? Hepatitis B If you have a higher risk for hepatitis B, you should be screened for this virus. Talk with your health care provider to find out if you are at risk for hepatitis B infection. Hepatitis C Testing is recommended for:  Everyone born from [birth year] through 1967.  Anyone with known risk factors for hepatitis C. Sexually transmitted infections (STIs)  Get screened for STIs, including gonorrhea and chlamydia, if: ? You are sexually active and are younger than 77 years of age. ? You are older than 77 years of age and your health care provider tells you that you are at risk for this type of infection. ? Your sexual activity has changed since you were last screened, and you are at increased risk for chlamydia or gonorrhea. Ask your health care  provider if you are at risk.  Ask your health care provider about whether you are at high risk for HIV. Your health care provider may recommend a prescription medicine to help prevent HIV infection. If you choose to take medicine to prevent HIV, you should first get tested for HIV. You should then be tested every 3 months for as long as you are taking the medicine. Pregnancy  If you are about to stop having your period (premenopausal) and  you may become pregnant, seek counseling before you get pregnant.  Take 400 to 800 micrograms (mcg) of folic acid every day if you become pregnant.  Ask for birth control (contraception) if you want to prevent pregnancy. Osteoporosis and menopause Osteoporosis is a disease in which the bones lose minerals and strength with aging. This can result in bone fractures. If you are 77 years old or older, or if you are at risk for osteoporosis and fractures, ask your health care provider if you should:  Be screened for bone loss.  Take a calcium or vitamin D supplement to lower your risk of fractures.  Be given hormone replacement therapy (HRT) to treat symptoms of menopause. Follow these instructions at home: Lifestyle  Do not use any products that contain nicotine or tobacco, such as cigarettes, e-cigarettes, and chewing tobacco. If you need help quitting, ask your health care provider.  Do not use street drugs.  Do not share needles.  Ask your health care provider for help if you need support or information about quitting drugs. Alcohol use  Do not drink alcohol if: ? Your health care provider tells you not to drink. ? You are pregnant, may be pregnant, or are planning to become pregnant.  If you drink alcohol: ? Limit how much you use to 0-1 drink a day. ? Limit intake if you are breastfeeding.  Be aware of how much alcohol is in your drink. In the [place] one drink equals one 12 oz bottle of beer (355 mL), one 5 oz glass of wine (148 mL), or one 1 oz glass of hard liquor (44 mL). General instructions  Schedule regular health, dental, and eye exams.  Stay current with your vaccines.  Tell your health care provider if: ? You often feel depressed. ? You have ever been abused or do not feel safe at home. Summary  Adopting a healthy lifestyle and getting preventive care are important in promoting health and wellness.  Follow your health care provider's instructions about healthy  diet, exercising, and getting tested or screened for diseases.  Follow your health care provider's instructions on monitoring your cholesterol and blood pressure. This information is not intended to replace advice given to you by your health care provider. Make sure you discuss any questions you have with your health care provider. Document Revised: 09/08/2018 Document Reviewed: 09/08/2018 Elsevier Patient Education  2021 [place].     Why follow it? Research shows  Those who follow the Mediterranean diet have a reduced risk of heart disease   The diet is associated with a reduced incidence of Parkinson's and Alzheimer's diseases  People following the diet may have longer life expectancies and lower rates of chronic diseases   The Dietary Guidelines for Americans recommends the Mediterranean diet as an eating plan to promote health and prevent disease  What Is the Mediterranean Diet?   Healthy eating plan based on typical foods and recipes of Mediterranean-style cooking  The diet is primarily a plant based diet; these foods should  make up a majority of meals   Starches - Plant based foods should make up a majority of meals - They are an important sources of vitamins, minerals, energy, antioxidants, and fiber - Choose whole grains, foods high in fiber and minimally processed items  - Typical grain sources include wheat, oats, barley, corn, brown rice, bulgar, farro, millet, polenta, couscous  - Various types of beans include chickpeas, lentils, fava beans, black beans, white beans   Fruits  Veggies - Large quantities of antioxidant [place] fruits & veggies; 6 or more servings  - Vegetables can be eaten raw or lightly drizzled with oil and cooked  - Vegetables common to the traditional Mediterranean Diet include: artichokes, arugula, beets, broccoli, brussel sprouts, cabbage, carrots, celery, collard greens, cucumbers, eggplant, kale, leeks, lemons, lettuce, mushrooms, okra, onions,  peas, peppers, potatoes, pumpkin, radishes, rutabaga, shallots, spinach, sweet potatoes, turnips, zucchini - Fruits common to the Mediterranean Diet include: apples, apricots, avocados, cherries, clementines, dates, figs, grapefruits, grapes, melons, nectarines, oranges, peaches, pears, pomegranates, strawberries, tangerines  Fats - Replace butter and margarine with healthy oils, such as olive oil, canola oil, and tahini  - Limit nuts to no more than a handful a day  - Nuts include walnuts, almonds, pecans, pistachios, pine nuts  - Limit or avoid candied, honey roasted or heavily salted nuts - Olives are central to the [employer] - can be eaten whole or used in a variety of dishes   Meats Protein - Limiting red meat: no more than a few times a month - When eating red meat: choose lean cuts and keep the portion to the size of deck of cards - Eggs: approx. 0 to 4 times a week  - Fish and lean poultry: at least 2 a week  - Healthy protein sources include, chicken, [place], lean beef, lamb - Increase intake of seafood such as tuna, salmon, trout, mackerel, shrimp, scallops - Avoid or limit high fat processed meats such as sausage and bacon  Dairy - Include moderate amounts of low fat dairy products  - Focus on healthy dairy such as fat free yogurt, skim milk, low or reduced fat cheese - Limit dairy products higher in fat such as whole or 2% milk, cheese, ice cream  Alcohol - Moderate amounts of red wine is ok  - No more than 5 oz daily for women (all ages) and men older than age 32  - No more than 10 oz of wine daily for men younger than 46  Other - Limit sweets and other desserts  - Use herbs and spices instead of salt to flavor foods  - Herbs and spices common to the traditional Mediterranean Diet include: basil, bay leaves, chives, cloves, cumin, fennel, garlic, lavender, marjoram, mint, oregano, parsley, pepper, rosemary, sage, savory, sumac, tarragon, thyme   Its not just a diet,  its a lifestyle:   The Mediterranean diet includes lifestyle factors typical of those in the region   Foods, drinks and meals are best eaten with others and savored  Daily physical activity is important for overall good health  This could be strenuous exercise like running and aerobics  This could also be more leisurely activities such as walking, housework, yard-work, or taking the stairs  Moderation is the key; a balanced and healthy diet accommodates most foods and drinks  Consider portion sizes and frequency of consumption of certain foods   Meal Ideas & Options:   Breakfast:  o Whole wheat toast or whole wheat [place]  muffins with peanut butter & hard boiled egg o Steel cut oats topped with apples & cinnamon and skim milk  o Fresh fruit: banana, strawberries, melon, berries, peaches  o Smoothies: strawberries, bananas, greek yogurt, peanut butter o Low fat greek yogurt with blueberries and granola  o Egg white omelet with spinach and mushrooms o Breakfast couscous: whole wheat couscous, apricots, skim milk, cranberries   Sandwiches:  o Hummus and grilled vegetables (peppers, zucchini, squash) on whole wheat bread   o Grilled chicken on whole wheat pita with lettuce, tomatoes, cucumbers or tzatziki  o Tuna salad on whole wheat bread: tuna salad made with greek yogurt, olives, red peppers, capers, green onions o Garlic rosemary lamb pita: lamb sauted with garlic, rosemary, salt & pepper; add lettuce, cucumber, greek yogurt to pita - flavor with lemon juice and black pepper   Seafood:  o Mediterranean grilled salmon, seasoned with garlic, basil, parsley, lemon juice and black pepper o Shrimp, lemon, and spinach whole-grain pasta salad made with low fat greek yogurt  o Seared scallops with lemon orzo  o Seared tuna steaks seasoned salt, pepper, coriander topped with tomato mixture of olives, tomatoes, olive oil, minced garlic, parsley, green onions and cappers   Meats:   o Herbed greek chicken salad with kalamata olives, cucumber, feta  o Red bell peppers stuffed with spinach, bulgur, lean ground beef (or lentils) & topped with feta   o Kebabs: skewers of chicken, tomatoes, onions, zucchini, squash  o [place] burgers: made with red onions, mint, dill, lemon juice, feta cheese topped with roasted red peppers  Vegetarian o Cucumber salad: cucumbers, artichoke hearts, celery, red onion, feta cheese, tossed in olive oil & lemon juice  o Hummus and whole grain pita points with a greek salad (lettuce, tomato, feta, olives, cucumbers, red onion) o Lentil soup with celery, carrots made with vegetable broth, garlic, salt and pepper  o Tabouli salad: parsley, bulgur, mint, scallions, cucumbers, tomato, radishes, lemon juice, olive oil, salt and pepper.      American Heart Association (AHA) Exercise Recommendation  Being physically active is important to prevent heart disease and stroke, the nations No. 1and No. 5killers. To improve overall cardiovascular health, we suggest at least 150 minutes per week of moderate exercise or 75 minutes per week of vigorous exercise (or a combination of moderate and vigorous activity). Thirty minutes a day, five times a week is an easy goal to remember. You will also experience benefits even if you divide your time into two or three segments of 10 to 15 minutes per day.  For people who would benefit from lowering their blood pressure or cholesterol, we recommend 40 minutes of aerobic exercise of moderate to vigorous intensity three to four times a week to lower the risk for heart attack and stroke.  Physical activity is anything that makes you move your body and burn calories.  This includes things like climbing stairs or playing sports. Aerobic exercises benefit your heart, and include walking, jogging, swimming or biking. Strength and stretching exercises are best for overall stamina and flexibility.  The simplest, positive change you  can make to effectively improve your heart health is to start walking. It's enjoyable, free, easy, social and great exercise. A walking program is flexible and boasts high success rates because people can stick with it. It's easy for walking to become a regular and satisfying part of life.   For Overall Cardiovascular Health:  At least 30 minutes of moderate-intensity aerobic activity at  least 5 days per week for a total of 150  OR   At least 25 minutes of vigorous aerobic activity at least 3 days per week for a total of 75 minutes; or a combination of moderate- and vigorous-intensity aerobic activity  AND   Moderate- to high-intensity muscle-strengthening activity at least 2 days per week for additional health benefits.  For Lowering Blood Pressure and Cholesterol  An average 40 minutes of moderate- to vigorous-intensity aerobic activity 3 or 4 times per week  What if I cant make it to the time goal? Something is always better than nothing! And everyone has to start somewhere. Even if you've been sedentary for years, today is the day you can begin to make healthy changes in your life. If you don't think you'll make it for 30 or 40 minutes, set a reachable goal for today. You can work up toward your overall goal by increasing your time as you get stronger. Don't let all-or-nothing thinking rob you of doing what you can every day.  Source:http://www.heart.org

## 2020-11-09 LAB — TSH: TSH: 0.955 u[IU]/mL (ref 0.450–4.500)

## 2020-11-09 LAB — LIPID PANEL
Chol/HDL Ratio: 3.7 ratio (ref 0.0–4.4)
Cholesterol, Total: 234 mg/dL — ABNORMAL HIGH (ref 100–199)
HDL: 64 mg/dL (ref >39)
LDL Chol Calc (NIH): 149 mg/dL — ABNORMAL HIGH (ref 0–99)
Triglycerides: 118 mg/dL (ref 0–149)
VLDL Cholesterol Cal: 21 mg/dL (ref 5–40)

## 2020-11-09 LAB — CBC WITH DIFFERENTIAL/PLATELET
Basophils Absolute: 0.1 10*3/uL (ref 0.0–0.2)
Basos: 1 %
EOS (ABSOLUTE): 0.2 10*3/uL (ref 0.0–0.4)
Eos: 2 %
Hematocrit: 43.6 % (ref 34.0–46.6)
Hemoglobin: 15.1 g/dL (ref 11.1–15.9)
Immature Grans (Abs): 0 10*3/uL (ref 0.0–0.1)
Immature Granulocytes: 0 %
Lymphocytes Absolute: 2.3 10*3/uL (ref 0.7–3.1)
Lymphs: 25 %
MCH: 34 pg — ABNORMAL HIGH (ref 26.6–33.0)
MCHC: 34.6 g/dL (ref 31.5–35.7)
MCV: 98 fL — ABNORMAL HIGH (ref 79–97)
Monocytes Absolute: 0.9 10*3/uL (ref 0.1–0.9)
Monocytes: 9 %
Neutrophils Absolute: 5.8 10*3/uL (ref 1.4–7.0)
Neutrophils: 63 %
Platelets: 227 10*3/uL (ref 150–450)
RBC: 4.44 x10E6/uL (ref 3.77–5.28)
RDW: 13.1 % (ref 11.7–15.4)
WBC: 9.2 10*3/uL (ref 3.4–10.8)

## 2020-11-09 LAB — CMP14+EGFR
ALT: 14 IU/L (ref 0–32)
AST: 27 IU/L (ref 0–40)
Albumin/Globulin Ratio: 2.1 (ref 1.2–2.2)
Albumin: 4.7 g/dL (ref 3.7–4.7)
Alkaline Phosphatase: 68 IU/L (ref 44–121)
BUN/Creatinine Ratio: 14 (ref 12–28)
BUN: 12 mg/dL (ref 8–27)
Bilirubin Total: 0.3 mg/dL (ref 0.0–1.2)
CO2: 23 mmol/L (ref 20–29)
Calcium: 9.1 mg/dL (ref 8.7–10.3)
Chloride: 97 mmol/L (ref 96–106)
Creatinine, Ser: 0.85 mg/dL (ref 0.57–1.00)
GFR calc Af Amer: 77 mL/min/1.73 (ref >59)
GFR calc non Af Amer: 67 mL/min/1.73 (ref >59)
Globulin, Total: 2.2 g/dL (ref 1.5–4.5)
Glucose: 93 mg/dL (ref 65–99)
Potassium: 4.4 mmol/L (ref 3.5–5.2)
Sodium: 136 mmol/L (ref 134–144)
Total Protein: 6.9 g/dL (ref 6.0–8.5)

## 2020-11-12 ENCOUNTER — Other Ambulatory Visit: Payer: Self-pay | Admitting: Family Medicine

## 2020-11-12 DIAGNOSIS — E782 Mixed hyperlipidemia: Secondary | ICD-10-CM

## 2020-11-12 MED ORDER — ATORVASTATIN CALCIUM 20 MG PO TABS: 20.0000 mg | ORAL_TABLET | Freq: Every day | ORAL | 3 refills | Status: DC

## 2020-11-14 ENCOUNTER — Telehealth: Payer: Self-pay

## 2020-11-14 ENCOUNTER — Other Ambulatory Visit: Payer: Self-pay

## 2020-11-14 DIAGNOSIS — M47817 Spondylosis without myelopathy or radiculopathy, lumbosacral region: Secondary | ICD-10-CM | POA: Diagnosis not present

## 2020-11-14 DIAGNOSIS — Z0000 Encounter for general adult medical examination without abnormal findings: Secondary | ICD-10-CM

## 2020-11-14 NOTE — Telephone Encounter (Signed)
Pt called stating that she just realized that she didn't have a UA test done when she came in the other day for her CPE.   Says she always has her urine checked when she has her physicals and would like PCP to put in order for her to have it checked.  Please advise and call patient.

## 2020-11-14 NOTE — Telephone Encounter (Signed)
Yes ok to order. Thank you.

## 2020-11-14 NOTE — Progress Notes (Signed)
Order for UA placed per note from Marjorie Smolder, [place]

## 2020-11-14 NOTE — Telephone Encounter (Signed)
Order placed and patient notified 

## 2020-11-29 ENCOUNTER — Other Ambulatory Visit: Payer: Self-pay

## 2020-11-29 ENCOUNTER — Other Ambulatory Visit: Payer: Medicare Other

## 2020-12-10 ENCOUNTER — Other Ambulatory Visit: Payer: Self-pay

## 2020-12-10 ENCOUNTER — Ambulatory Visit (INDEPENDENT_AMBULATORY_CARE_PROVIDER_SITE_OTHER): Payer: Medicare Other | Admitting: Family

## 2020-12-10 ENCOUNTER — Encounter: Payer: Self-pay | Admitting: Family

## 2020-12-10 VITALS — BP 138/79 | HR 100 | Temp 97.9°F | Ht 66.0 in | Wt 117.0 lb

## 2020-12-10 DIAGNOSIS — L249 Irritant contact dermatitis, unspecified cause: Secondary | ICD-10-CM | POA: Diagnosis not present

## 2020-12-10 MED ORDER — PREDNISONE 10 MG (21) PO TBPK: ORAL_TABLET | ORAL | 0 refills | Status: DC

## 2020-12-10 MED ORDER — TRIAMCINOLONE ACETONIDE 0.1 % EX CREA: 1.0000 "application " | TOPICAL_CREAM | Freq: Two times a day (BID) | CUTANEOUS | 0 refills | Status: DC

## 2020-12-10 MED ORDER — HYDROXYZINE PAMOATE 25 MG PO CAPS: 25.0000 mg | ORAL_CAPSULE | Freq: Three times a day (TID) | ORAL | 0 refills | Status: DC | PRN

## 2020-12-10 NOTE — Progress Notes (Signed)
Subjective:    Patient ID: [name], female    DOB: April 22, 1946, 75 y.o.   MRN: [id number]  Chief Complaint  Patient presents with  . Rash    On both arms since Friday. No change in medications lotions soaps or detergent. Itches has taken benadryl     Rash This is a new problem. The current episode started in the past 7 days. The problem has been gradually worsening since onset. The affected locations include the left arm and right arm. The rash is characterized by itchiness and redness. She was exposed to nothing. Pertinent negatives include no congestion, cough, diarrhea, eye pain, fatigue, fever, joint pain, shortness of breath, sore throat or vomiting. Past treatments include anti-itch cream, antihistamine, cold compress and moisturizer. The treatment provided no relief. There is no history of allergies or asthma.      Review of Systems  Constitutional: Negative for fatigue and fever.  HENT: Negative for congestion and sore throat.   Eyes: Negative for pain.  Respiratory: Negative for cough and shortness of breath.   Gastrointestinal: Negative for diarrhea and vomiting.  Musculoskeletal: Negative for joint pain.  Skin: Positive for rash.  All other systems reviewed and are negative.      Objective:   Physical Exam Vitals reviewed.  Constitutional:      General: She is not in acute distress.    Appearance: She is well-developed.  HENT:     Head: Normocephalic and atraumatic.  Eyes:     Pupils: Pupils are equal, round, and reactive to light.  Neck:     Thyroid: No thyromegaly.  Cardiovascular:     Rate and Rhythm: Normal rate and regular rhythm.     Heart sounds: Normal heart sounds. No murmur heard.   Pulmonary:     Effort: Pulmonary effort is normal. No respiratory distress.     Breath sounds: Normal breath sounds. No wheezing.  Abdominal:     General: Bowel sounds are normal. There is no distension.     Palpations: Abdomen is soft.     Tenderness:  There is no abdominal tenderness.  Musculoskeletal:        General: No tenderness. Normal range of motion.     Cervical back: Normal range of motion and neck supple.  Skin:    General: Skin is warm and dry.     Findings: Rash present.          Comments: Right arm erythemas rash apprx 4X4 cm, left arm lateral arm 1X1.4 cm and 1X2.5 cm  Neurological:     Mental Status: She is alert and oriented to person, place, and time.     Cranial Nerves: No cranial nerve deficit.     Deep Tendon Reflexes: Reflexes are normal and symmetric.  Psychiatric:        Behavior: Behavior normal.        Thought Content: Thought content normal.        Judgment: Judgment normal.      BP 138/79   Pulse 100   Temp 97.9 F (36.6 C) (Temporal)   Ht 5\' 6"  (1.676 m)   Wt 117 lb (53.1 kg)   BMI 18.88 kg/m       Assessment & Plan:  [name] comes in today with chief complaint of Rash (On both arms since Friday. No change in medications lotions soaps or detergent. Itches has taken benadryl )   Diagnosis and orders addressed:  1. Irritant contact dermatitis, unspecified trigger  Avoid scratching Cool compresses  RTO if symptoms worsen or do not improve  - triamcinolone (KENALOG) 0.1 %; Apply 1 application topically 2 (two) times daily.  Dispense: 30 g; Refill: 0 - hydrOXYzine (VISTARIL) 25 MG capsule; Take 1 capsule (25 mg total) by mouth 3 (three) times daily as needed.  Dispense: 60 capsule; Refill: 0   Evelina Dun, FNP

## 2020-12-10 NOTE — Patient Instructions (Signed)
Contact Dermatitis Dermatitis is redness, soreness, and swelling (inflammation) of the skin. Contact dermatitis is a reaction to certain substances that touch the skin. Many different substances can cause contact dermatitis. There are two types of contact dermatitis:  Irritant contact dermatitis. This type is caused by something that irritates your skin, such as having dry hands from washing them too often with soap. This type does not require previous exposure to the substance for a reaction to occur. This is the most common type.  Allergic contact dermatitis. This type is caused by a substance that you are allergic to, such as poison ivy. This type occurs when you have been exposed to the substance (allergen) and develop a sensitivity to it. Dermatitis may develop soon after your first exposure to the allergen, or it may not develop until the next time you are exposed and every time thereafter. What are the causes? Irritant contact dermatitis is most commonly caused by exposure to:  Makeup.  Soaps.  Detergents.  Bleaches.  Acids.  Metal salts, such as nickel. Allergic contact dermatitis is most commonly caused by exposure to:  Poisonous plants.  Chemicals.  Jewelry.  Latex.  Medicines.  Preservatives in products, such as clothing. What increases the risk? You are more likely to develop this condition if you have:  A job that exposes you to irritants or allergens.  Certain medical conditions, such as asthma or eczema. What are the signs or symptoms? Symptoms of this condition may occur on your body anywhere the irritant has touched you or is touched by you.  Symptoms include: ? Dryness or flaking. ? Redness. ? Cracks. ? Itching. ? Pain or a burning feeling. ? Blisters. ? Drainage of small amounts of blood or clear fluid from skin cracks. With allergic contact dermatitis, there may also be swelling in areas such as the eyelids, mouth, or genitals.   How is this  diagnosed? This condition is diagnosed with a medical history and physical exam.  A patch skin test may be performed to help determine the cause.  If the condition is related to your job, you may need to see an occupational medicine specialist. How is this treated? This condition is treated by checking for the cause of the reaction and protecting your skin from further contact. Treatment may also include:  Steroid creams or ointments. Oral steroid medicines may be needed in more severe cases.  Antibiotic medicines or antibacterial ointments, if a skin infection is present.  Antihistamine lotion or an antihistamine taken by mouth to ease itching.  A bandage (dressing). Follow these instructions at home: Skin care  Moisturize your skin as needed.  Apply cool compresses to the affected areas.  Try applying baking soda paste to your skin. Stir water into baking soda until it reaches a paste-like consistency.  Do not scratch your skin, and avoid friction to the affected area.  Avoid the use of soaps, perfumes, and dyes. Medicines  Take or apply over-the-counter and prescription medicines only as told by your health care provider.  If you were prescribed an antibiotic medicine, take or apply the antibiotic as told by your health care provider. Do not stop using the antibiotic even if your condition improves. Bathing  Try taking a bath with: ? Epsom salts. Follow the instructions on the packaging. You can get these at your local pharmacy or grocery store. ? Baking soda. Pour a small amount into the bath as directed by your health care provider. ? Colloidal oatmeal. Follow the instructions   on the packaging. You can get this at your local pharmacy or grocery store.  Bathe less frequently, such as every other day.  Bathe in lukewarm water. Avoid using hot water. Bandage care  If you were given a bandage (dressing), change it as told by your health care provider.  Wash your hands  with soap and water before and after you change your dressing. If soap and water are not available, use hand sanitizer. General instructions  Avoid the substance that caused your reaction. If you do not know what caused it, keep a journal to try to track what caused it. Write down: ? What you eat. ? What cosmetic products you use. ? What you drink. ? What you wear in the affected area. This includes jewelry.  Check the affected areas every day for signs of infection. Check for: ? More redness, swelling, or pain. ? More fluid or blood. ? Warmth. ? Pus or a bad smell.  Keep all follow-up visits as told by your health care provider. This is important. Contact a health care provider if:  Your condition does not improve with treatment.  Your condition gets worse.  You have signs of infection such as swelling, tenderness, redness, soreness, or warmth in the affected area.  You have a fever.  You have new symptoms. Get help right away if:  You have a severe headache, neck pain, or neck stiffness.  You vomit.  You feel very sleepy.  You notice red streaks coming from the affected area.  Your bone or joint underneath the affected area becomes painful after the skin has healed.  The affected area turns darker.  You have difficulty breathing. Summary  Dermatitis is redness, soreness, and swelling (inflammation) of the skin. Contact dermatitis is a reaction to certain substances that touch the skin.  Symptoms of this condition may occur on your body anywhere the irritant has touched you or is touched by you.  This condition is treated by figuring out what caused the reaction and protecting your skin from further contact. Treatment may also include medicines and skin care.  Avoid the substance that caused your reaction. If you do not know what caused it, keep a journal to try to track what caused it.  Contact a health care provider if your condition gets worse or you have signs  of infection such as swelling, tenderness, redness, soreness, or warmth in the affected area. This information is not intended to replace advice given to you by your health care provider. Make sure you discuss any questions you have with your health care provider. Document Revised: 01/05/2019 Document Reviewed: 03/31/2018 Elsevier Patient Education  2021 Elsevier Inc.  

## 2020-12-11 DIAGNOSIS — L308 Other specified dermatitis: Secondary | ICD-10-CM | POA: Diagnosis not present

## 2020-12-18 DIAGNOSIS — L309 Dermatitis, unspecified: Secondary | ICD-10-CM | POA: Diagnosis not present

## 2020-12-25 DIAGNOSIS — L309 Dermatitis, unspecified: Secondary | ICD-10-CM | POA: Diagnosis not present

## 2020-12-25 DIAGNOSIS — L308 Other specified dermatitis: Secondary | ICD-10-CM | POA: Diagnosis not present

## 2021-01-14 ENCOUNTER — Other Ambulatory Visit: Payer: Self-pay | Admitting: Family Medicine

## 2021-01-14 ENCOUNTER — Telehealth: Payer: Self-pay

## 2021-01-14 DIAGNOSIS — L249 Irritant contact dermatitis, unspecified cause: Secondary | ICD-10-CM

## 2021-01-14 DIAGNOSIS — J302 Other seasonal allergic rhinitis: Secondary | ICD-10-CM

## 2021-01-14 MED ORDER — TRIAMCINOLONE ACETONIDE 0.1 % EX CREA
1.0000 | TOPICAL_CREAM | Freq: Two times a day (BID) | CUTANEOUS | 0 refills | Status: DC
Start: 2021-01-14 — End: 2021-03-07

## 2021-01-14 NOTE — Telephone Encounter (Signed)
Patient aware and verbalized understanding. Patient states that derm told her it was extreme eczema and they done a prednisone tapper back. She took her last one yesterday it is a lot better but not all the way. Patient is asking if you would be willing to send in another rx for it to [facility]. Please advise?

## 2021-01-14 NOTE — Telephone Encounter (Signed)
Patient aware and verbalized understanding. °

## 2021-01-14 NOTE — Telephone Encounter (Signed)
It looks like Alyse Low also gave her a prednisone pack in March. I don't feel comfortable giving additional prednisone since she has recently had a prednisone pack. I did sent in a refill on the kenalog cream.

## 2021-01-14 NOTE — Telephone Encounter (Signed)
Referral placed.

## 2021-01-14 NOTE — Telephone Encounter (Signed)
REFERRAL REQUEST Telephone Note  Have you been seen at our office for this problem? Yes with tiffany (Advise that they may need an appointment with their PCP before a referral can be done)  Reason for Referral: allergies Referral discussed with patient: yes Best contact number of patient for referral team: [phone]   Has patient been seen by a specialist for this issue before: no Patient provider preference for referral: [facility] Patient location preference for referral: [place]   Patient notified that referrals can take up to a week or longer to process. If they haven't heard anything within a week they should call back and speak with the referral department.

## 2021-01-15 IMAGING — CT CT CERVICAL SPINE W/O CM
2 series · 10 of 14 positions shown, 12 images · non-contrast
Comparison: None

CLINICAL DATA: Chronic intractable headache with pressure and
soreness to back of neck.

EXAM:
CT CERVICAL SPINE WITHOUT CONTRAST
TECHNIQUE: Multidetector CT imaging of the cervical spine was performed without
intravenous contrast. Multiplanar CT image reconstructions were also
generated.

[Series 3: cspine soft · axial · 0.28mm/px · z∈[+101,+217]mm · 5 of 84 slices shown]
[im 14/84  soft-tissue]
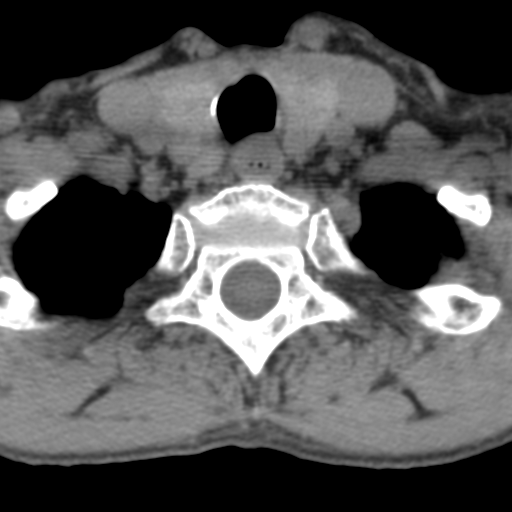
[im 28/84  soft-tissue]
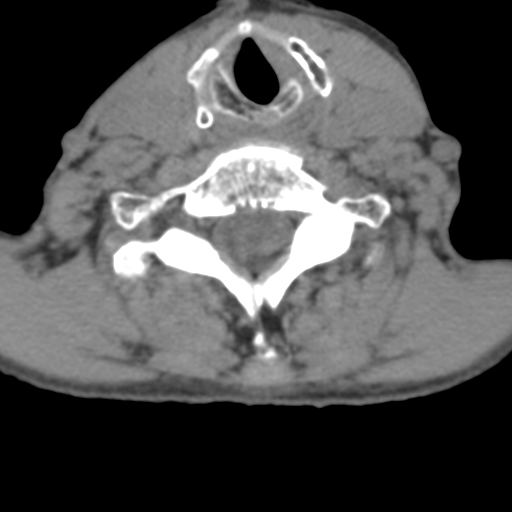
[im 42/84  soft-tissue]
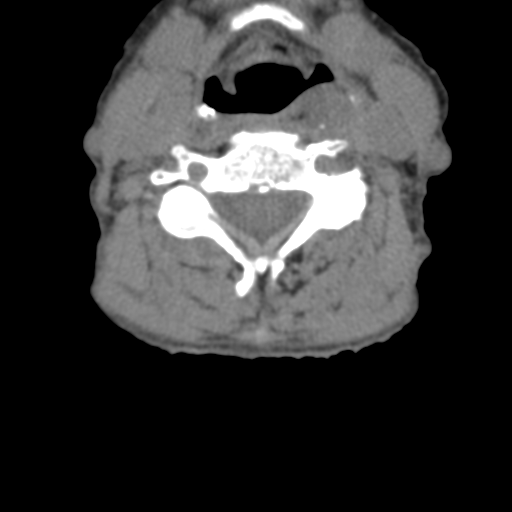
[im 56/84  soft-tissue]
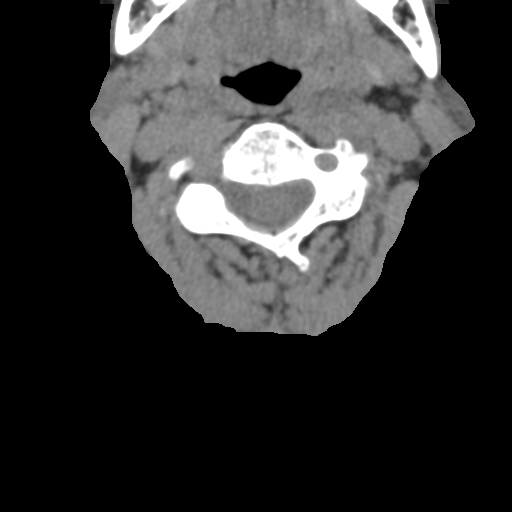
[im 70/84  soft-tissue]
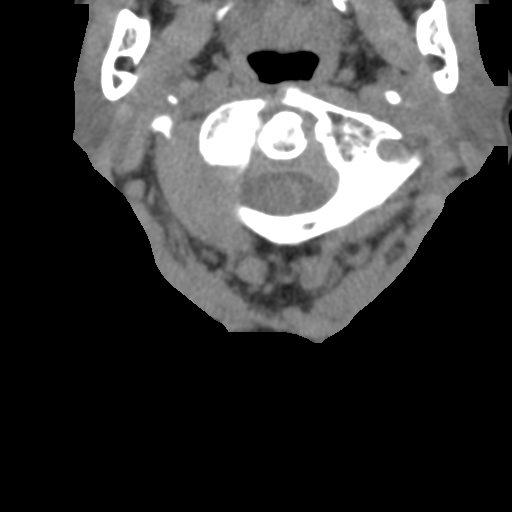

[Series 9: angled axial · axial · 0.29mm/px · z∈[+89,+197]mm · 5 of 83 slices shown, 7 images]
[im 14/83  soft-tissue]
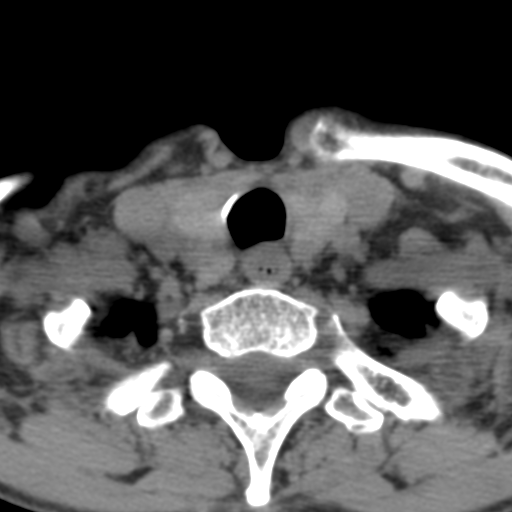
[im 14/83  bone]
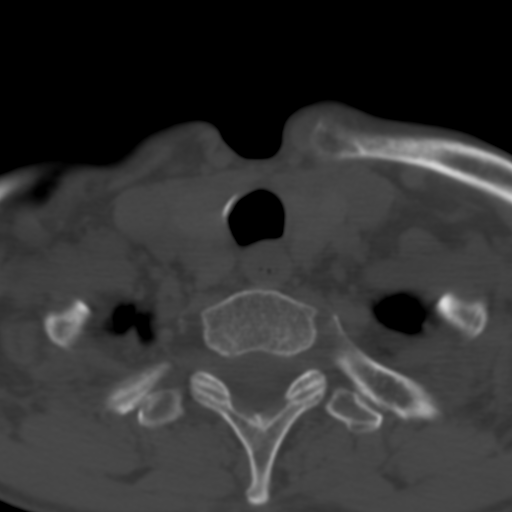
[im 28/83  bone]
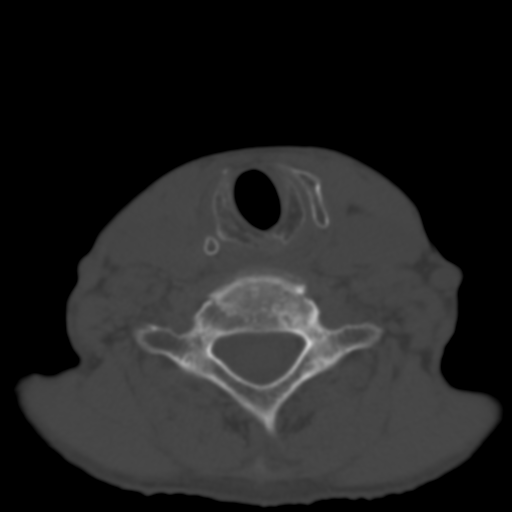
[im 42/83  bone]
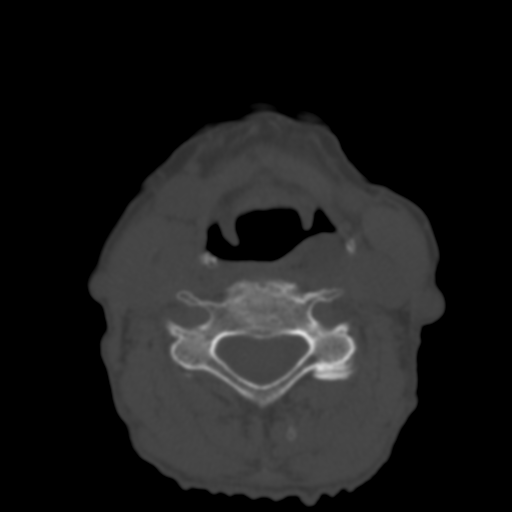
[im 55/83  bone]
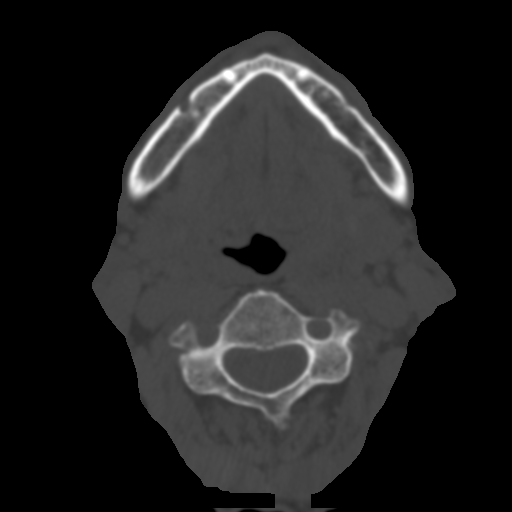
[im 69/83  soft-tissue]
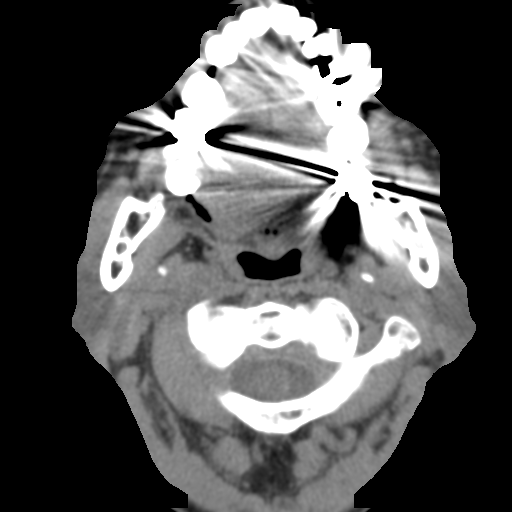
[im 69/83  bone]
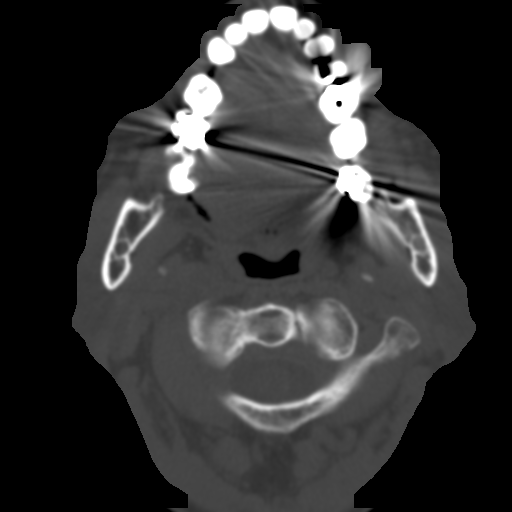

[10 of 14 positions shown; findings below may reference images not displayed]

FINDINGS: Alignment: Mild reversal of normal lordosis at C2-C3 and evidence of
degenerative change, no signs of acute fracture or subluxation.

Skull base and vertebrae: No acute fracture. No primary bone lesion
or focal pathologic process.

Soft tissues and spinal canal: No sign of hematoma in the spinal
canal or other acute process in the neck.

Within the right trachea there is lobular soft tissue that is
contiguous with the right thyroid gland measuring approximately
by 0.5 cm in terms of its intraluminal diameter on axial images.
This is at approximately the T1 level at the thoracic inlet best
seen on image 20 of series 7 and image 79 of series 3 this is along
the rightward aspect of the trachea and causes no airway
obstruction. No signs of prevertebral edema. No signs of adenopathy.

Disc levels: Multilevel degenerative change with moderate disc space
narrowing at C3-4, C3-5 and C5-C6.

Upper chest: Emphysema at the lung apices.

Other: None.
IMPRESSION: 1. No fracture or traumatic malalignment.
2. There is a lobular soft tissue in the right trachea that is
contiguous with the right thyroid gland and causes no airway
obstruction. This could represent ectopic thyroid tissue extending
into the trachea, neoplasm with invasion of the trachea is also
considered. Direct visualization with laryngoscopy or bronchoscopy
may be helpful as clinically warranted.
3. Degenerative changes in the cervical spine.
4. Emphysema at the lung apices.

Emphysema ([id number]).

## 2021-01-24 DIAGNOSIS — M47816 Spondylosis without myelopathy or radiculopathy, lumbar region: Secondary | ICD-10-CM | POA: Diagnosis not present

## 2021-02-11 ENCOUNTER — Telehealth: Payer: Self-pay

## 2021-02-11 NOTE — Telephone Encounter (Signed)
Patient is calling to [facility]

## 2021-02-14 DIAGNOSIS — G8929 Other chronic pain: Secondary | ICD-10-CM | POA: Diagnosis not present

## 2021-02-14 DIAGNOSIS — M47817 Spondylosis without myelopathy or radiculopathy, lumbosacral region: Secondary | ICD-10-CM | POA: Diagnosis not present

## 2021-03-05 ENCOUNTER — Ambulatory Visit: Payer: Medicare Other | Admitting: Internal Medicine

## 2021-03-07 ENCOUNTER — Other Ambulatory Visit: Payer: Self-pay

## 2021-03-07 ENCOUNTER — Ambulatory Visit (INDEPENDENT_AMBULATORY_CARE_PROVIDER_SITE_OTHER): Payer: Medicare Other | Admitting: Internal Medicine

## 2021-03-07 ENCOUNTER — Encounter: Payer: Self-pay | Admitting: Internal Medicine

## 2021-03-07 ENCOUNTER — Ambulatory Visit: Payer: Medicare Other | Admitting: Internal Medicine

## 2021-03-07 VITALS — BP 126/82 | HR 110 | Temp 98.4°F | Resp 18 | Ht 66.0 in | Wt 113.4 lb

## 2021-03-07 DIAGNOSIS — G894 Chronic pain syndrome: Secondary | ICD-10-CM

## 2021-03-07 DIAGNOSIS — Z720 Tobacco use: Secondary | ICD-10-CM

## 2021-03-07 DIAGNOSIS — G43009 Migraine without aura, not intractable, without status migrainosus: Secondary | ICD-10-CM

## 2021-03-07 DIAGNOSIS — E782 Mixed hyperlipidemia: Secondary | ICD-10-CM

## 2021-03-07 DIAGNOSIS — Z23 Encounter for immunization: Secondary | ICD-10-CM

## 2021-03-07 DIAGNOSIS — F1120 Opioid dependence, uncomplicated: Secondary | ICD-10-CM

## 2021-03-07 DIAGNOSIS — I700 Atherosclerosis of aorta: Secondary | ICD-10-CM

## 2021-03-07 LAB — CBC
HCT: 45.5 % (ref 36.0–46.0)
Hemoglobin: 15.4 g/dL — ABNORMAL HIGH (ref 12.0–15.0)
MCHC: 33.9 g/dL (ref 30.0–36.0)
MCV: 100.8 fl — ABNORMAL HIGH (ref 78.0–100.0)
Platelets: 249 10*3/uL (ref 150.0–400.0)
RBC: 4.51 Mil/uL (ref 3.87–5.11)
RDW: 14.6 % (ref 11.5–15.5)
WBC: 8.5 10*3/uL (ref 4.0–10.5)

## 2021-03-07 LAB — COMPREHENSIVE METABOLIC PANEL
ALT: 15 U/L (ref 0–35)
AST: 20 U/L (ref 0–37)
Albumin: 4.6 g/dL (ref 3.5–5.2)
Alkaline Phosphatase: 62 U/L (ref 39–117)
BUN: 10 mg/dL (ref 6–23)
CO2: 30 mEq/L (ref 19–32)
Calcium: 9.3 mg/dL (ref 8.4–10.5)
Chloride: 99 mEq/L (ref 96–112)
Creatinine, Ser: 0.72 mg/dL (ref 0.40–1.20)
GFR: 81.09 mL/min (ref >60.00)
Glucose, Bld: 102 mg/dL — ABNORMAL HIGH (ref 70–99)
Potassium: 4.2 mEq/L (ref 3.5–5.1)
Sodium: 136 mEq/L (ref 135–145)
Total Bilirubin: 0.5 mg/dL (ref 0.2–1.2)
Total Protein: 7.3 g/dL (ref 6.0–8.3)

## 2021-03-07 LAB — LIPID PANEL
Cholesterol: 159 mg/dL (ref 0–200)
HDL: 63.2 mg/dL (ref >39.00)
LDL Cholesterol: 74 mg/dL (ref 0–99)
NonHDL: 95.99
Total CHOL/HDL Ratio: 3
Triglycerides: 109 mg/dL (ref 0.0–149.0)
VLDL: 21.8 mg/dL (ref 0.0–40.0)

## 2021-03-07 NOTE — Assessment & Plan Note (Signed)
Seeing pain management and they are managing her opioids. Has extensive OA and multiple joint replacements.

## 2021-03-07 NOTE — Assessment & Plan Note (Signed)
Advised due to this she should continue lipitor lifelong.

## 2021-03-07 NOTE — Assessment & Plan Note (Signed)
Counseled about risk and harm from smoking. No quit attempt today.

## 2021-03-07 NOTE — Assessment & Plan Note (Signed)
Taking lipitor 20 mg daily and checking lipid panel as she started about 3-4 months ago. Would have LDL <100 or <70 ideal as goal for her. Adjust as needed.

## 2021-03-07 NOTE — Patient Instructions (Addendum)
We will check the labs today and have given you the pneumonia 20 vaccine.

## 2021-03-07 NOTE — Assessment & Plan Note (Signed)
Imitrex prn and not often.

## 2021-03-07 NOTE — Progress Notes (Signed)
   Subjective:   Patient ID: [name], female    DOB: September 22, 1946, 75 y.o.   MRN: [id number]  HPI The patient is a new 75 YO female coming in for hyperlipidemia (started lipitor about 3-4 months ago, denies side effects, taking daily), and smoking (not able to quit at this time, about 1/2 PPD).  PMH, [facility], social history reviewed and updated  Review of Systems  Constitutional: Negative.   HENT: Negative.    Eyes: Negative.   Respiratory:  Negative for cough, chest tightness and shortness of breath.   Cardiovascular:  Negative for chest pain, palpitations and leg swelling.  Gastrointestinal:  Negative for abdominal distention, abdominal pain, constipation, diarrhea, nausea and vomiting.  Musculoskeletal:  Positive for arthralgias and back pain.  Skin: Negative.   Neurological: Negative.   Psychiatric/Behavioral: Negative.     Objective:  Physical Exam Constitutional:      Appearance: She is well-developed.  HENT:     Head: Normocephalic and atraumatic.  Cardiovascular:     Rate and Rhythm: Normal rate and regular rhythm.  Pulmonary:     Effort: Pulmonary effort is normal. No respiratory distress.     Breath sounds: Normal breath sounds. No wheezing or rales.  Abdominal:     General: Bowel sounds are normal. There is no distension.     Palpations: Abdomen is soft.     Tenderness: There is no abdominal tenderness. There is no rebound.  Musculoskeletal:        General: Tenderness present.     Cervical back: Normal range of motion.  Skin:    General: Skin is warm and dry.  Neurological:     Mental Status: She is alert and oriented to person, place, and time.     Coordination: Coordination normal.    Vitals:   03/07/21 1055  BP: 126/82  Pulse: (!) 110  Resp: 18  Temp: 98.4 F (36.9 C)  TempSrc: Oral  SpO2: 96%  Weight: 113 lb 6.4 oz (51.4 kg)  Height: 5\' 6"  (1.676 m)    This visit occurred during the SARS-CoV-2 public health emergency.  Safety protocols were  in place, including screening questions prior to the visit, additional usage of staff PPE, and extensive cleaning of exam room while observing appropriate contact time as indicated for disinfecting solutions.   Assessment & Plan:  Prevnar 20 given at visit

## 2021-03-07 NOTE — Assessment & Plan Note (Signed)
Satisfied with current pain level and provider. Is on chronic opioids.

## 2021-03-20 DIAGNOSIS — S80811A Abrasion, right lower leg, initial encounter: Secondary | ICD-10-CM | POA: Diagnosis not present

## 2021-04-17 ENCOUNTER — Telehealth: Payer: Self-pay

## 2021-04-17 NOTE — Telephone Encounter (Signed)
Pt has called asking that Dr. Sharlet Salina please send in a referral to pts [facility]in and V[facility] doctor for a [place] to be done on her rt leg due to pain in the leg.  Dr. Carlis Abbott [phone].

## 2021-04-19 NOTE — Telephone Encounter (Signed)
See below

## 2021-04-22 NOTE — Telephone Encounter (Signed)
This is a new problem so should have visit.

## 2021-04-23 NOTE — Telephone Encounter (Signed)
Patient has been scheduled for 04/29/2021 at 3:20 pm. Patient is aware of appointment date and time.

## 2021-04-24 DIAGNOSIS — J301 Allergic rhinitis due to pollen: Secondary | ICD-10-CM | POA: Diagnosis not present

## 2021-04-24 DIAGNOSIS — F17200 Nicotine dependence, unspecified, uncomplicated: Secondary | ICD-10-CM | POA: Diagnosis not present

## 2021-04-24 DIAGNOSIS — H1045 Other chronic allergic conjunctivitis: Secondary | ICD-10-CM | POA: Diagnosis not present

## 2021-04-24 DIAGNOSIS — J309 Allergic rhinitis, unspecified: Secondary | ICD-10-CM | POA: Diagnosis not present

## 2021-04-29 ENCOUNTER — Ambulatory Visit: Payer: Medicare Other | Admitting: Internal Medicine

## 2021-04-29 DIAGNOSIS — M47817 Spondylosis without myelopathy or radiculopathy, lumbosacral region: Secondary | ICD-10-CM | POA: Diagnosis not present

## 2021-05-01 ENCOUNTER — Ambulatory Visit: Payer: Medicare Other | Admitting: Internal Medicine

## 2021-05-02 DIAGNOSIS — Z85828 Personal history of other malignant neoplasm of skin: Secondary | ICD-10-CM | POA: Diagnosis not present

## 2021-05-02 DIAGNOSIS — R600 Localized edema: Secondary | ICD-10-CM | POA: Diagnosis not present

## 2021-05-08 ENCOUNTER — Ambulatory Visit: Payer: Medicare Other | Admitting: Family Medicine

## 2021-05-15 ENCOUNTER — Encounter: Payer: Self-pay | Admitting: Family Medicine

## 2021-05-15 ENCOUNTER — Encounter: Payer: Self-pay | Admitting: Internal Medicine

## 2021-05-30 DIAGNOSIS — M47817 Spondylosis without myelopathy or radiculopathy, lumbosacral region: Secondary | ICD-10-CM | POA: Diagnosis not present

## 2021-05-30 DIAGNOSIS — R030 Elevated blood-pressure reading, without diagnosis of hypertension: Secondary | ICD-10-CM | POA: Diagnosis not present

## 2021-05-30 DIAGNOSIS — G8929 Other chronic pain: Secondary | ICD-10-CM | POA: Diagnosis not present

## 2021-06-19 ENCOUNTER — Encounter: Payer: Self-pay | Admitting: *Deleted

## 2021-06-19 ENCOUNTER — Ambulatory Visit (INDEPENDENT_AMBULATORY_CARE_PROVIDER_SITE_OTHER): Payer: Medicare Other | Admitting: *Deleted

## 2021-06-19 DIAGNOSIS — Z0000 Encounter for general adult medical examination without abnormal findings: Secondary | ICD-10-CM | POA: Diagnosis not present

## 2021-06-19 NOTE — Patient Instructions (Signed)
Health Maintenance, Female Adopting a healthy lifestyle and getting preventive care are important in promoting health and wellness. Ask your health care provider about: The right schedule for you to have regular tests and exams. Things you can do on your own to prevent diseases and keep yourself healthy. What should I know about diet, weight, and exercise? Eat a healthy diet  Eat a diet that includes plenty of vegetables, fruits, low-fat dairy products, and lean protein. Do not eat a lot of foods that are high in solid fats, added sugars, or sodium. Maintain a healthy weight Body mass index (BMI) is used to identify weight problems. It estimates body fat based on height and weight. Your health care provider can help determine your BMI and help you achieve or maintain a healthy weight. Get regular exercise Get regular exercise. This is one of the most important things you can do for your health. Most adults should: Exercise for at least 150 minutes each week. The exercise should increase your heart rate and make you sweat (moderate-intensity exercise). Do strengthening exercises at least twice a week. This is in addition to the moderate-intensity exercise. Spend less time sitting. Even light physical activity can be beneficial. Watch cholesterol and blood lipids Have your blood tested for lipids and cholesterol at 77 years of age, then have this test every 5 years. Have your cholesterol levels checked more often if: Your lipid or cholesterol levels are high. You are older than 77 years of age. You are at high risk for heart disease. What should I know about cancer screening? Depending on your health history and family history, you may need to have cancer screening at various ages. This may include screening for: Breast cancer. Cervical cancer. Colorectal cancer. Skin cancer. Lung cancer. What should I know about heart disease, diabetes, and high blood pressure? Blood pressure and heart  disease High blood pressure causes heart disease and increases the risk of stroke. This is more likely to develop in people who have high blood pressure readings, are of African descent, or are overweight. Have your blood pressure checked: Every 3-5 years if you are 18-39 years of age. Every year if you are 40 years old or older. Diabetes Have regular diabetes screenings. This checks your fasting blood sugar level. Have the screening done: Once every three years after age 40 if you are at a normal weight and have a low risk for diabetes. More often and at a younger age if you are overweight or have a high risk for diabetes. What should I know about preventing infection? Hepatitis B If you have a higher risk for hepatitis B, you should be screened for this virus. Talk with your health care provider to find out if you are at risk for hepatitis B infection. Hepatitis C Testing is recommended for: Everyone born from 1945 through 1965. Anyone with known risk factors for hepatitis C. Sexually transmitted infections (STIs) Get screened for STIs, including gonorrhea and chlamydia, if: You are sexually active and are younger than 77 years of age. You are older than 77 years of age and your health care provider tells you that you are at risk for this type of infection. Your sexual activity has changed since you were last screened, and you are at increased risk for chlamydia or gonorrhea. Ask your health care provider if you are at risk. Ask your health care provider about whether you are at high risk for HIV. Your health care provider may recommend a prescription medicine   to help prevent HIV infection. If you choose to take medicine to prevent HIV, you should first get tested for HIV. You should then be tested every 3 months for as long as you are taking the medicine. Pregnancy If you are about to stop having your period (premenopausal) and you may become pregnant, seek counseling before you get  pregnant. Take 400 to 800 micrograms (mcg) of folic acid every day if you become pregnant. Ask for birth control (contraception) if you want to prevent pregnancy. Osteoporosis and menopause Osteoporosis is a disease in which the bones lose minerals and strength with aging. This can result in bone fractures. If you are 65 years old or older, or if you are at risk for osteoporosis and fractures, ask your health care provider if you should: Be screened for bone loss. Take a calcium or vitamin D supplement to lower your risk of fractures. Be given hormone replacement therapy (HRT) to treat symptoms of menopause. Follow these instructions at home: Lifestyle Do not use any products that contain nicotine or tobacco, such as cigarettes, e-cigarettes, and chewing tobacco. If you need help quitting, ask your health care provider. Do not use street drugs. Do not share needles. Ask your health care provider for help if you need support or information about quitting drugs. Alcohol use Do not drink alcohol if: Your health care provider tells you not to drink. You are pregnant, may be pregnant, or are planning to become pregnant. If you drink alcohol: Limit how much you use to 0-1 drink a day. Limit intake if you are breastfeeding. Be aware of how much alcohol is in your drink. In the [place] one drink equals one 12 oz bottle of beer (355 mL), one 5 oz glass of wine (148 mL), or one 1 oz glass of hard liquor (44 mL). General instructions Schedule regular health, dental, and eye exams. Stay current with your vaccines. Tell your health care provider if: You often feel depressed. You have ever been abused or do not feel safe at home. Summary Adopting a healthy lifestyle and getting preventive care are important in promoting health and wellness. Follow your health care provider's instructions about healthy diet, exercising, and getting tested or screened for diseases. Follow your health care provider's  instructions on monitoring your cholesterol and blood pressure. This information is not intended to replace advice given to you by your health care provider. Make sure you discuss any questions you have with your health care provider. Document Revised: 11/23/2020 Document Reviewed: 09/08/2018 Elsevier Patient Education  2022 Elsevier Inc.  

## 2021-06-19 NOTE — Progress Notes (Signed)
Subjective:   [name] is a 75 y.o. female who presents for Medicare Annual (Subsequent) preventive examination.  I connected with  Vilma Meckel on 06/19/21 by audio enabled telemedicine application and verified that I am speaking with the correct person using two identifiers.   I discussed the limitations of evaluation and management by telemedicine. The patient expressed understanding and agreed to proceed.   Location of Patient: Home Location of Provider: Office Persons participating in visit: [place] (patient) & Jari Favre, CMA  Review of Systems    Defer to PCP Cardiac Risk Factors include: none     Objective:    There were no vitals filed for this visit. There is no height or weight on file to calculate BMI.  Advanced Directives 06/19/2021 03/13/2020 08/15/2019 08/09/2019 12/23/2017 01/26/2017 01/13/2017  Does Patient Have a Medical Advance Directive? Yes Yes Yes Yes Yes Yes Yes  Type of Paramedic of [place];Living will;Out of facility DNR (pink MOST or yellow form) [place];Living will [place];Living will [place];Living will - [place];Living will [place];Living will  Does patient want to make changes to medical advance directive? - No - Patient declined No - Patient declined No - Patient declined - - -  Copy of [place] in Chart? No - copy requested - No - copy requested No - copy requested - - -  Would patient like information on creating a medical advance directive? - - - - - - -    Current Medications (verified) Outpatient Encounter Medications as of 06/19/2021  Medication Sig   atorvastatin (LIPITOR) 20 MG tablet Take 1 tablet (20 mg total) by mouth daily.   HYDROcodone-acetaminophen (NORCO) 10-325 MG tablet Take 1 tablet by mouth 3 (three) times daily as needed.   loratadine (CLARITIN) 10 MG tablet Take 10 mg by  mouth daily.   montelukast (SINGULAIR) 10 MG tablet Take 1 tablet (10 mg total) by mouth at bedtime.   ondansetron (ZOFRAN-ODT) 4 MG disintegrating tablet Take 4 mg by mouth every 8 (eight) hours as needed for nausea or vomiting.   SUMAtriptan (IMITREX) 50 MG tablet Take 1 tablet (50 mg total) by mouth every 2 (two) hours as needed for migraine. May repeat in 2 hours if headache persists or recurs.   No facility-administered encounter medications on file as of 06/19/2021.    Allergies (verified) Bee venom, Cephalexin, and Gabapentin   History: Past Medical History:  Diagnosis Date   AAA (abdominal aortic aneurysm) ([place])    saw Dr Kellie Simmering 11/2013.  2.7 cm   Arthritis    Chicken pox    COPD (chronic obstructive pulmonary disease) ([facility])    smokes 1/2ppd   DDD (degenerative disc disease), lumbar    GERD (gastroesophageal reflux disease)    diet controlled   History of blood transfusion    History of lumbar fusion 08/09/2014   Hyperlipidemia    IBS (irritable bowel syndrome)    diet contolled   Mild mitral regurgitation    RBBB    S/P arthroscopy of right shoulder 12/15/2017   Seasonal allergies    Varicose veins    Past Surgical History:  Procedure Laterality Date   APPENDECTOMY     BACK SURGERY N/A [phone]   5 back surgeries   BREAST BIOPSY  1989,05   lt br bx   BREAST EXCISIONAL BIOPSY Bilateral    CATARACT EXTRACTION W/ INTRAOCULAR LENS  IMPLANT Bilateral 2018   CHOLECYSTECTOMY     EXCISION/RELEASE BURSA HIP Right 05/12/2013   Procedure: EXCISION RIGHT TROCHANTERIC BURSA/CALCIFICATION ;  Surgeon: Ninetta Lights, MD;  Location: [facility];  Service: Orthopedics;  Laterality: Right;   FASCIOTOMY Right 05/12/2013   Procedure: RIGHT FASCIOTOMY HIP ANY TYPE;  Surgeon: Ninetta Lights, MD;  Location: [facility];  Service: Orthopedics;  Laterality: Right;   LARYNGOSCOPY AND BRONCHOSCOPY N/A 08/15/2019   Procedure: BRONCHOSCOPY/LARYNGOSCOPY;   Surgeon: Leta Baptist, MD;  Location: [facility];  Service: ENT;  Laterality: N/A;   NM [place]  04/13/2003   negative   SHOULDER ARTHROSCOPY WITH OPEN ROTATOR CUFF REPAIR Right 2018   skin cancer removed right leg     TONSILLECTOMY AND [place] ARTHROPLASTY Left 2006   lt total hip   TOTAL HIP ARTHROPLASTY Right 2011   TOTAL HIP ARTHROPLASTY Right 2016   second surgery due to recall   US ECHOCARDIOGRAPHY  04/13/2003   EF 65%, mild TR w/suggestion of mild pulmonary hypertension,thickened anterior leaflet,central mild MR.   VAGINAL HYSTERECTOMY  1993   Family History  Problem Relation Age of Onset   Early death Mother        aortic aneurysm   Vasculitis Mother        temporal arteritis   Aortic aneurysm Mother    Heart disease Mother        before age 34   Hyperlipidemia Mother    Hypertension Mother    COPD Sister    Hypertension Sister    Depression Sister    Heart disease Sister        before age 73   Post-traumatic stress disorder Sister    Bipolar disorder Sister    Brain cancer Maternal Aunt 84       brain   Heart disease Maternal Grandmother        aortic aneurysm   Aortic aneurysm Maternal Grandmother    Hypertension Sister    Throat cancer Maternal Grandfather    Breast cancer Neg Hx    Social History   Socioeconomic History   Marital status: Legally Separated    Spouse name: Not on file   Number of children: Not on file   Years of education: Not on file   Highest education level: Not on file  Occupational History   Not on file  Tobacco Use   Smoking status: Every Day    Packs/day: 0.50    Years: 30.00    Pack years: 15.00    Types: Cigarettes   Smokeless tobacco: Never  Vaping Use   Vaping Use: Never used  Substance and Sexual Activity   Alcohol use: Not Currently    Alcohol/week: 0.0 standard drinks    Comment: very occasional, not monthly   Drug use: No   Sexual activity: Not Currently  Other  Topics Concern   Not on file  Social History Narrative   Not on file   Social Determinants of Health   Financial Resource Strain: Low Risk    Difficulty of Paying Living Expenses: Not hard at all  Food Insecurity: No Food Insecurity   Worried About Charity fundraiser in the Last Year: Never true   Arboriculturist in the Last Year: Never true  Transportation Needs: No Transportation Needs   Lack of Transportation (Medical): No   Lack of Transportation (Non-Medical): No  Physical Activity:  Insufficiently Active   Days of Exercise per Week: 3 days   Minutes of Exercise per Session: 10 min  Stress: No Stress Concern Present   Feeling of Stress : Not at all  Social Connections: Moderately Isolated   Frequency of Communication with Friends and Family: More than three times a week   Frequency of Social Gatherings with Friends and Family: Three times a week   Attends Religious Services: 1 to 4 times per year   Active Member of Clubs or Organizations: No   Attends Archivist Meetings: Never   Marital Status: Divorced    Tobacco Counseling Ready to quit: Not Answered Counseling given: Not Answered   Clinical Intake:     Pain : No/denies pain     BMI - recorded: 13.81 Nutritional Risks: Other (Comment) Diabetes: No  How often do you need to have someone help you when you read instructions, pamphlets, or other written materials from your doctor or pharmacy?: 1 - Never  Diabetic? No   Interpreter Needed?: No      Activities of Daily Living In your present state of health, do you have any difficulty performing the following activities: 06/19/2021  Hearing? N  Vision? N  Difficulty concentrating or making decisions? N  Walking or climbing stairs? N  Dressing or bathing? N  Doing errands, shopping? N  Preparing Food and eating ? N  Using the Toilet? N  In the past six months, have you accidently leaked urine? N  Do you have problems with loss of bowel  control? N  Managing your Medications? N  Managing your Finances? N  Housekeeping or managing your Housekeeping? N  Some recent data might be hidden    Patient Care Team: Hoyt Koch, MD as PCP - General (Internal Medicine) Leeroy Cha, MD as Consulting Physician (Neurosurgery) Clydell Hakim, MD (Inactive) as Consulting Physician (Pain Medicine) Richmond Campbell, MD as Consulting Physician (Gastroenterology) [facility] (Vascular Surgery)  Indicate any recent Medical Services you may have received from other than [facility] providers in the past year (date may be approximate).     Assessment:   This is a routine wellness examination for [place].  Hearing/Vision screen No results found.  Dietary issues and exercise activities discussed: Current Exercise Habits: Home exercise routine, Type of exercise: walking, Time (Minutes): 15, Frequency (Times/Week): 4, Weekly Exercise (Minutes/Week): 60, Intensity: Mild, Exercise limited by: None identified   Goals Addressed   None    Depression Screen PHQ 2/9 Scores 06/19/2021 12/10/2020 11/08/2020 06/12/2020 04/10/2020 03/13/2020 10/11/2019  PHQ - 2 Score 0 0 0 0 0 0 0  PHQ- 9 Score - - - - - - -    Fall Risk Fall Risk  06/19/2021 12/10/2020 11/08/2020 06/12/2020 04/10/2020  Falls in the past year? 0 0 0 1 1  Number falls in past yr: 0 - - 0 1  Injury with Fall? 0 - - 0 0  Comment - - - - -  Risk for fall due to : - - - Other (Comment) -  Risk for fall due to: Comment - - - Was not paying attention -  Follow up - - - Falls prevention discussed Falls prevention discussed    FALL RISK PREVENTION PERTAINING TO THE HOME:  Any stairs in or around the home? Yes  If so, are there any without handrails? No  Home free of loose throw rugs in walkways, pet beds, electrical cords, etc? Yes  Adequate lighting in your home  to reduce risk of falls? Yes   ASSISTIVE DEVICES UTILIZED TO PREVENT FALLS:  Life alert? No  Use of a  cane, walker or w/c? No  Grab bars in the bathroom? Yes  Shower chair or bench in shower? Yes  Elevated toilet seat or a handicapped toilet? Yes   TIMED UP AND GO:  Was the test performed?  n/a .  Length of time to ambulate 10 feet: n/a  sec.     Cognitive Function:     6CIT Screen 06/19/2021 03/13/2020  What Year? 0 points 0 points  What month? 0 points 0 points  What time? 0 points 0 points  Count back from 20 0 points 0 points  Months in reverse 0 points 0 points  Repeat phrase 8 points 0 points  Total Score 8 0    Immunizations Immunization History  Administered Date(s) Administered   Fluad Quad(high Dose 65+) 06/11/2019   Influenza, High Dose Seasonal PF 07/27/2017, 06/29/2018, 06/18/2021   Influenza, Seasonal, Injecte, Preservative Fre 07/04/2014, 08/09/2015   Influenza-Unspecified 07/04/2014, 08/09/2015, 07/27/2017, 06/29/2018, 06/13/2019, 07/16/2020   Moderna SARS-COV2 Booster Vaccination 06/18/2021   Moderna Sars-Covid-2 Vaccination 11/28/2019, 12/26/2019, 05/21/2020, 01/08/2021   PNEUMOCOCCAL CONJUGATE-20 03/07/2021   Pneumococcal Conjugate-13 05/10/2014, 08/09/2015   Pneumococcal Polysaccharide-23 04/02/2017   Tdap 01/31/2013   Zoster Recombinat (Shingrix) 12/04/2016, 07/11/2019, 09/27/2019   Zoster, Live 07/04/2014    TDAP status: Up to date  Flu Vaccine status: Up to date  Pneumococcal vaccine status: Up to date  Covid-19 vaccine status: Completed vaccines  Qualifies for Shingles Vaccine? Yes   Zostavax completed Yes   Shingrix Completed?: Yes  Screening Tests Health Maintenance  Topic Date Due   MAMMOGRAM  10/24/2022   TETANUS/TDAP  02/01/2023   INFLUENZA VACCINE  Completed   DEXA SCAN  Completed   COVID-19 Vaccine  Completed   Hepatitis C Screening  Completed   Zoster Vaccines- Shingrix  Completed   HPV VACCINES  Aged Out    Health Maintenance  There are no preventive care reminders to display for this patient.   Colorectal cancer  screening: No longer required.   Mammogram status: Completed 10/24/2020. Repeat every year  Bone Density status: Completed 04/17/2017. Results reflect: Bone density results: OSTEOPENIA. Repeat every 2 years.  Lung Cancer Screening: (Low Dose CT Chest recommended if Age 70-80 years, 30 pack-year currently smoking OR have quit w/in 15years.) does qualify.   Lung Cancer Screening Referral: Defer to PCP  Additional Screening:  Hepatitis C Screening: does qualify; Completed   Vision Screening: Recommended annual ophthalmology exams for early detection of glaucoma and other disorders of the eye. Is the patient up to date with their annual eye exam?  Yes Who is the provider or what is the name of the office in which the patient attends annual eye exams? Dr. Truman Hayward  If pt is not established with a provider, would they like to be referred to a provider to establish care? No .   Dental Screening: Recommended annual dental exams for proper oral hygiene  Community Resource Referral / Chronic Care Management: CRR required this visit?  No   CCM required this visit?  No      Plan:     I have personally reviewed and noted the following in the patient's chart:   Medical and social history Use of alcohol, tobacco or illicit drugs  Current medications and supplements including opioid prescriptions.  Functional ability and status Nutritional status Physical activity Advanced directives List of other physicians  Hospitalizations, surgeries, and ER visits in previous 12 months Vitals Screenings to include cognitive, depression, and falls Referrals and appointments  In addition, I have reviewed and discussed with patient certain preventive protocols, quality metrics, and best practice recommendations. A written personalized care plan for preventive services as well as general preventive health recommendations were provided to patient.     Cannon Kettle, [place]   06/19/2021   Nurse Notes: 14 non  face to face   Ms. Siedschlag , Thank you for taking time to come for your Medicare Wellness Visit. I appreciate your ongoing commitment to your health goals. Please review the following plan we discussed and let me know if I can assist you in the future.   These are the goals we discussed:  Goals      Exercise 150 min/wk Moderate Activity     Have 3 meals a day        This is a list of the screening recommended for you and due dates:  Health Maintenance  Topic Date Due   Mammogram  10/24/2022   Tetanus Vaccine  02/01/2023   Flu Shot  Completed   DEXA scan (bone density measurement)  Completed   COVID-19 Vaccine  Completed   Hepatitis C Screening: USPSTF Recommendation to screen - Ages 81-79 yo.  Completed   Zoster (Shingles) Vaccine  Completed   HPV Vaccine  Aged Out

## 2021-07-23 DIAGNOSIS — Z049 Encounter for examination and observation for unspecified reason: Secondary | ICD-10-CM | POA: Diagnosis not present

## 2021-07-23 DIAGNOSIS — I700 Atherosclerosis of aorta: Secondary | ICD-10-CM | POA: Diagnosis not present

## 2021-07-23 DIAGNOSIS — Z720 Tobacco use: Secondary | ICD-10-CM | POA: Diagnosis not present

## 2021-07-23 DIAGNOSIS — R531 Weakness: Secondary | ICD-10-CM | POA: Diagnosis not present

## 2021-07-23 DIAGNOSIS — R918 Other nonspecific abnormal finding of lung field: Secondary | ICD-10-CM | POA: Diagnosis not present

## 2021-07-23 DIAGNOSIS — R519 Headache, unspecified: Secondary | ICD-10-CM | POA: Diagnosis not present

## 2021-07-23 DIAGNOSIS — R079 Chest pain, unspecified: Secondary | ICD-10-CM | POA: Diagnosis not present

## 2021-07-23 DIAGNOSIS — M549 Dorsalgia, unspecified: Secondary | ICD-10-CM | POA: Diagnosis not present

## 2021-08-01 DIAGNOSIS — M47817 Spondylosis without myelopathy or radiculopathy, lumbosacral region: Secondary | ICD-10-CM | POA: Diagnosis not present

## 2021-09-06 ENCOUNTER — Other Ambulatory Visit: Payer: Self-pay

## 2021-09-06 ENCOUNTER — Encounter: Payer: Self-pay | Admitting: Internal Medicine

## 2021-09-06 ENCOUNTER — Ambulatory Visit (INDEPENDENT_AMBULATORY_CARE_PROVIDER_SITE_OTHER): Payer: Medicare Other | Admitting: Internal Medicine

## 2021-09-06 DIAGNOSIS — I83893 Varicose veins of bilateral lower extremities with other complications: Secondary | ICD-10-CM

## 2021-09-06 DIAGNOSIS — Z636 Dependent relative needing care at home: Secondary | ICD-10-CM

## 2021-09-06 MED ORDER — DIAZEPAM 5 MG PO TABS: 5.0000 mg | ORAL_TABLET | Freq: Two times a day (BID) | ORAL | 1 refills | Status: DC | PRN

## 2021-09-06 MED ORDER — TRIAMCINOLONE ACETONIDE 0.1 % EX CREA: 1.0000 "application " | TOPICAL_CREAM | Freq: Two times a day (BID) | CUTANEOUS | 2 refills | Status: DC

## 2021-09-06 NOTE — Assessment & Plan Note (Signed)
Some inflammation of the sclerosed veins/varicose veins on the legs and ankles. Rx triamcinolone ointment to use on the irritation.

## 2021-09-06 NOTE — Patient Instructions (Addendum)
We have sent in a short term supply of the valium to help when needed. This is not meant to be long term and not meant to be taken daily.   If you are using more than 3 times a week let us know and it would be more appropriate for a daily medicine to help control the mood.  We have sent in a cream triamcinolone ointment to use twice a day on the ankle.

## 2021-09-06 NOTE — Progress Notes (Signed)
   Subjective:   Patient ID: [name], female    DOB: July 26, 1946, 75 y.o.   MRN: [id number]  HPI The patient is a 75 YO female coming in for follow up.  Review of Systems  Constitutional: Negative.   HENT: Negative.    Eyes: Negative.   Respiratory:  Negative for cough, chest tightness and shortness of breath.   Cardiovascular:  Negative for chest pain, palpitations and leg swelling.  Gastrointestinal:  Negative for abdominal distention, abdominal pain, constipation, diarrhea, nausea and vomiting.  Musculoskeletal:  Positive for myalgias.  Skin: Negative.   Neurological: Negative.   Psychiatric/Behavioral: Negative.     Objective:  Physical Exam Constitutional:      Appearance: She is well-developed.  HENT:     Head: Normocephalic and atraumatic.  Cardiovascular:     Rate and Rhythm: Normal rate and regular rhythm.  Pulmonary:     Effort: Pulmonary effort is normal. No respiratory distress.     Breath sounds: Normal breath sounds. No wheezing or rales.  Abdominal:     General: Bowel sounds are normal. There is no distension.     Palpations: Abdomen is soft.     Tenderness: There is no abdominal tenderness. There is no rebound.  Musculoskeletal:     Cervical back: Normal range of motion.  Skin:    General: Skin is warm and dry.     Comments: Multiple sclerosed veins on the feet bilateral and ankles, with tenderness to palpation left ankle  Neurological:     Mental Status: She is alert and oriented to person, place, and time.     Coordination: Coordination normal.    Vitals:   09/06/21 1103  BP: 130/70  Pulse: 84  Resp: 18  SpO2: 97%  Weight: 113 lb 6.4 oz (51.4 kg)  Height: 5\' 6"  (1.676 m)    This visit occurred during the SARS-CoV-2 public health emergency.  Safety protocols were in place, including screening questions prior to the visit, additional usage of staff PPE, and extensive cleaning of exam room while observing appropriate contact time as  indicated for disinfecting solutions.   Assessment & Plan:

## 2021-09-06 NOTE — Assessment & Plan Note (Signed)
Rx short supply valium per patient request. She is taking hydrocodone chronically and advised that these cannot be taken together and that this is not meant to be long term. If she is using other than rarely she will need alternative agent and she agrees.

## 2021-09-11 ENCOUNTER — Other Ambulatory Visit: Payer: Self-pay | Admitting: Internal Medicine

## 2021-09-11 DIAGNOSIS — G43009 Migraine without aura, not intractable, without status migrainosus: Secondary | ICD-10-CM

## 2021-09-16 ENCOUNTER — Other Ambulatory Visit: Payer: Self-pay

## 2021-09-16 DIAGNOSIS — I714 Abdominal aortic aneurysm, without rupture, unspecified: Secondary | ICD-10-CM

## 2021-09-17 ENCOUNTER — Telehealth: Payer: Self-pay | Admitting: Internal Medicine

## 2021-09-17 NOTE — Telephone Encounter (Signed)
Patient calling in  Says at her OV 12.09 she bought in a transportation voucher from [place] that she requested to be faxed.. patient says [employer] fax is currently down  Requesting form be mailed to her mailing address on file

## 2021-09-26 NOTE — Telephone Encounter (Signed)
I do not recall. If a form was faxed at visit typically we would give this back to patient or send to scan.

## 2021-09-26 NOTE — Telephone Encounter (Signed)
Called pt at [phone]. Unable to leave vm due to vm not beng setup.   If pt calls back please let her know that she never gave [place] a form to fill out, if she did we would have given the form back to her.

## 2021-10-01 ENCOUNTER — Other Ambulatory Visit: Payer: Self-pay

## 2021-10-01 ENCOUNTER — Ambulatory Visit (HOSPITAL_COMMUNITY)
Admission: RE | Admit: 2021-10-01 | Discharge: 2021-10-01 | Disposition: A | Discharge status: Home / Self Care | Payer: Medicare Other | Source: Ambulatory Visit | Attending: Vascular Surgery | Admitting: Vascular Surgery

## 2021-10-01 ENCOUNTER — Ambulatory Visit (INDEPENDENT_AMBULATORY_CARE_PROVIDER_SITE_OTHER): Payer: Commercial Managed Care - HMO | Admitting: Vascular Surgery

## 2021-10-01 ENCOUNTER — Encounter: Payer: Self-pay | Admitting: Vascular Surgery

## 2021-10-01 VITALS — BP 143/90 | HR 90 | Temp 98.0°F | Resp 16 | Ht 66.0 in | Wt 111.0 lb

## 2021-10-01 DIAGNOSIS — I7143 Infrarenal abdominal aortic aneurysm, without rupture: Secondary | ICD-10-CM

## 2021-10-01 DIAGNOSIS — I714 Abdominal aortic aneurysm, without rupture, unspecified: Secondary | ICD-10-CM | POA: Diagnosis not present

## 2021-10-01 NOTE — Progress Notes (Signed)
Patient name: [name] MRN: [id number] DOB: 1946-01-05 Sex: female  REASON FOR VISIT: 1 year follow-up for abdominal aortic aneurysm and celiac artery aneurysm  HPI: [name] is a 76 y.o. female with history of hyperlipidemia as well as tobacco abuse presents for ongoing surveillance of her abdominal aortic aneurysm and small celiac aneurysm.  She was previously followed by Dr. Kellie Simmering for a 2.7 cm AAA initially diagnosed in 2015.  Last seen in our clinic last year when her AAA was 3.9 cm and celiac aneurysm 1.76 cm.  She reports no significant changes to her health over the last year.  Still smoking 6-8 cigarettes a day.  She does have chronic back pain and feels this is at her baseline.  No new concerns today.  Her mother and grandmother died of AAA.   Past Medical History:  Diagnosis Date   AAA (abdominal aortic aneurysm)    saw Dr Kellie Simmering 11/2013.  2.7 cm   Arthritis    Chicken pox    COPD (chronic obstructive pulmonary disease) (HCC)    smokes 1/2ppd   DDD (degenerative disc disease), lumbar    GERD (gastroesophageal reflux disease)    diet controlled   History of blood transfusion    History of lumbar fusion 08/09/2014   Hyperlipidemia    IBS (irritable bowel syndrome)    diet contolled   Mild mitral regurgitation    RBBB    S/P arthroscopy of right shoulder 12/15/2017   Seasonal allergies    Varicose veins     Past Surgical History:  Procedure Laterality Date   APPENDECTOMY     BACK SURGERY N/A [phone]   5 back surgeries   BREAST BIOPSY  1989,05   lt br bx   BREAST EXCISIONAL BIOPSY Bilateral    CATARACT EXTRACTION W/ INTRAOCULAR LENS IMPLANT Bilateral 2018   CHOLECYSTECTOMY     EXCISION/RELEASE BURSA HIP Right 05/12/2013   Procedure: EXCISION RIGHT TROCHANTERIC BURSA/CALCIFICATION ;  Surgeon: Ninetta Lights, MD;  Location: [facility];  Service: Orthopedics;  Laterality: Right;   FASCIOTOMY Right 05/12/2013   Procedure: RIGHT  FASCIOTOMY HIP ANY TYPE;  Surgeon: Ninetta Lights, MD;  Location: [facility];  Service: Orthopedics;  Laterality: Right;   LARYNGOSCOPY AND BRONCHOSCOPY N/A 08/15/2019   Procedure: BRONCHOSCOPY/LARYNGOSCOPY;  Surgeon: Leta Baptist, MD;  Location: [facility];  Service: ENT;  Laterality: N/A;   NM [place]  04/13/2003   negative   SHOULDER ARTHROSCOPY WITH OPEN ROTATOR CUFF REPAIR Right 2018   skin cancer removed right leg     TONSILLECTOMY AND ADENOIDECTOMY  1950   TOTAL HIP ARTHROPLASTY Left 2006   lt total hip   TOTAL HIP ARTHROPLASTY Right 2011   TOTAL HIP ARTHROPLASTY Right 2016   second surgery due to recall   US ECHOCARDIOGRAPHY  04/13/2003   EF 65%, mild TR w/suggestion of mild pulmonary hypertension,thickened anterior leaflet,central mild MR.   VAGINAL HYSTERECTOMY  1993    Family History  Problem Relation Age of Onset   Early death Mother        aortic aneurysm   Vasculitis Mother        temporal arteritis   Aortic aneurysm Mother    Heart disease Mother        before age 61   Hyperlipidemia Mother    Hypertension Mother    COPD Sister    Hypertension Sister    Depression Sister  Heart disease Sister        before age 30   Post-traumatic stress disorder Sister    Bipolar disorder Sister    Brain cancer Maternal Aunt 84       brain   Heart disease Maternal Grandmother        aortic aneurysm   Aortic aneurysm Maternal Grandmother    Hypertension Sister    Throat cancer Maternal Grandfather    Breast cancer Neg Hx     SOCIAL HISTORY: Social History   Tobacco Use   Smoking status: Every Day    Packs/day: 0.50    Years: 30.00    Pack years: 15.00    Types: Cigarettes   Smokeless tobacco: Never  Substance Use Topics   Alcohol use: Not Currently    Alcohol/week: 0.0 standard drinks    Comment: very occasional, not monthly    Allergies  Allergen Reactions   Bee Venom     Other reaction(s): Redness   Cephalexin  Other (See Comments)    Headaches   Gabapentin Diarrhea    Current Outpatient Medications  Medication Sig Dispense Refill   atorvastatin (LIPITOR) 20 MG tablet Take 1 tablet (20 mg total) by mouth daily. 90 tablet 3   diazepam (VALIUM) 5 MG tablet Take 1 tablet (5 mg total) by mouth every 12 (twelve) hours as needed for anxiety. 30 tablet 1   HYDROcodone-acetaminophen (NORCO) 10-325 MG tablet Take 1 tablet by mouth 3 (three) times daily as needed.     ipratropium (ATROVENT) 0.06 % nasal spray Place 1-2 sprays into both nostrils in the morning and at bedtime.     levocetirizine (XYZAL) 5 MG tablet Take 5 mg by mouth at bedtime.     loratadine (CLARITIN) 10 MG tablet Take 10 mg by mouth daily.     montelukast (SINGULAIR) 10 MG tablet Take 1 tablet (10 mg total) by mouth at bedtime. 90 tablet 3   ondansetron (ZOFRAN-ODT) 4 MG disintegrating tablet Take 4 mg by mouth every 8 (eight) hours as needed for nausea or vomiting.     SUMAtriptan (IMITREX) 50 MG tablet TAKE 1 TABLET AT ONSET OF HEADACHE, MAY REPEAT ONCE IN 2 HOURS IF PERSISTS 10 tablet 3   triamcinolone cream (KENALOG) 0.1 % Apply 1 application topically 2 (two) times daily. 100 g 2   No current facility-administered medications for this visit.    REVIEW OF SYSTEMS:  [X]  denotes positive finding, [ ]  denotes negative finding Cardiac  Comments:  Chest pain or chest pressure:    Shortness of breath upon exertion:    Short of breath when lying flat:    Irregular heart rhythm:        Vascular    Pain in calf, thigh, or hip brought on by ambulation:    Pain in feet at night that wakes you up from your sleep:     Blood clot in your veins:    Leg swelling:         Pulmonary    Oxygen at home:    Productive cough:     Wheezing:         Neurologic    Sudden weakness in arms or legs:     Sudden numbness in arms or legs:     Sudden onset of difficulty speaking or slurred speech:    Temporary loss of vision in one eye:      Problems with dizziness:         Gastrointestinal  Blood in stool:     Vomited blood:         Genitourinary    Burning when urinating:     Blood in urine:        Psychiatric    Major depression:         Hematologic    Bleeding problems:    Problems with blood clotting too easily:        Skin    Rashes or ulcers:        Constitutional    Fever or chills:      PHYSICAL EXAM: Vitals:   10/01/21 0940  BP: (!) 143/90  Pulse: 90  Resp: 16  Temp: 98 F (36.7 C)  TempSrc: Temporal  SpO2: 93%  Weight: 111 lb (50.3 kg)  Height: 5\' 6"  (1.676 m)    GENERAL: The patient is a well-nourished female, in no acute distress. The vital signs are documented above. CARDIAC: There is a regular rate and rhythm.  VASCULAR:  2+ femoral pulses palpable bilaterally Right DP and left PT palpable PULMONARY: No respiratory distress. ABDOMEN:   No pain with deep palpation of her abdomen and no pain with palpation of aneurysm. MUSCULOSKELETAL: There are no major deformities or cyanosis. NEUROLOGIC: No focal weakness or paresthesias are detected. SKIN: There are no ulcers or rashes noted. PSYCHIATRIC: The patient has a normal affect.  DATA:   AAA duplex today suggest that her abdominal aortic aneurysm has grown from 3.9 cm to 4.26 cm over the past year in the distal abdominal aorta.  The celiac artery aneurysm stable at 1.76 cm.  There is a elevated velocity in the celiac artery 342 cm/s.  The right and left common iliac arteries are 1.4 and 1.6 cm.  Assessment/Plan:  76 year old female has been followed for known abdominal aortic aneurysm as well as small celiac artery aneurysm.  She presents for 1 year follow-up and interval surveillance today.  Her aneurysm has shown some growth over the last year and now measures 4.26 cm from 3.9 cm approximately 1 year ago.  She has no pain with palpation of her aneurysm in clinic.  Discussed that in the absence of symptoms guidelines are still to  repair AAA at greater than 5 cm in women.  Her small celiac artery aneurysm is also stable at 1.76 cm.  Discussed we could reasonably follow-up again in 1 year but given her family history of ruptured aneurysm in her mother and grandmother we agreed on 36-month interval surveillance.  I will plan to see her in 6 months with AAA duplex  Marty Heck, MD [facility] Office: [place]

## 2021-10-02 ENCOUNTER — Other Ambulatory Visit: Payer: Self-pay

## 2021-10-02 DIAGNOSIS — I7143 Infrarenal abdominal aortic aneurysm, without rupture: Secondary | ICD-10-CM

## 2021-10-08 DIAGNOSIS — Y939 Activity, unspecified: Secondary | ICD-10-CM | POA: Diagnosis not present

## 2021-10-08 DIAGNOSIS — Y929 Unspecified place or not applicable: Secondary | ICD-10-CM | POA: Diagnosis not present

## 2021-10-08 DIAGNOSIS — W0110XA Fall on same level from slipping, tripping and stumbling with subsequent striking against unspecified object, initial encounter: Secondary | ICD-10-CM | POA: Diagnosis not present

## 2021-10-08 DIAGNOSIS — S81812A Laceration without foreign body, left lower leg, initial encounter: Secondary | ICD-10-CM | POA: Diagnosis not present

## 2021-10-08 DIAGNOSIS — Z720 Tobacco use: Secondary | ICD-10-CM | POA: Diagnosis not present

## 2021-10-08 DIAGNOSIS — R296 Repeated falls: Secondary | ICD-10-CM | POA: Diagnosis not present

## 2021-10-08 DIAGNOSIS — R262 Difficulty in walking, not elsewhere classified: Secondary | ICD-10-CM | POA: Diagnosis not present

## 2021-10-10 DIAGNOSIS — M47817 Spondylosis without myelopathy or radiculopathy, lumbosacral region: Secondary | ICD-10-CM | POA: Diagnosis not present

## 2021-10-16 ENCOUNTER — Other Ambulatory Visit: Payer: Self-pay

## 2021-10-16 ENCOUNTER — Encounter: Payer: Self-pay | Admitting: Internal Medicine

## 2021-10-16 ENCOUNTER — Ambulatory Visit (INDEPENDENT_AMBULATORY_CARE_PROVIDER_SITE_OTHER): Payer: Medicare Other | Admitting: Internal Medicine

## 2021-10-16 DIAGNOSIS — M79605 Pain in left leg: Secondary | ICD-10-CM

## 2021-10-16 NOTE — Progress Notes (Signed)
° °  Subjective:   Patient ID: [name], female    DOB: 1946/08/26, 76 y.o.   MRN: [id number]  HPI The patient is a 76 YO female coming in for fall and left leg pain and wound. Closed with steri strips and they are in place.  Review of Systems  Constitutional: Negative.   HENT: Negative.    Eyes: Negative.   Respiratory:  Negative for cough, chest tightness and shortness of breath.   Cardiovascular:  Negative for chest pain, palpitations and leg swelling.  Gastrointestinal:  Negative for abdominal distention, abdominal pain, constipation, diarrhea, nausea and vomiting.  Musculoskeletal:  Positive for myalgias.  Skin:  Positive for wound.  Neurological: Negative.   Psychiatric/Behavioral: Negative.     Objective:  Physical Exam Constitutional:      Appearance: She is well-developed.  HENT:     Head: Normocephalic and atraumatic.  Cardiovascular:     Rate and Rhythm: Normal rate and regular rhythm.  Pulmonary:     Effort: Pulmonary effort is normal. No respiratory distress.     Breath sounds: Normal breath sounds. No wheezing or rales.  Abdominal:     General: Bowel sounds are normal. There is no distension.     Palpations: Abdomen is soft.     Tenderness: There is no abdominal tenderness. There is no rebound.  Musculoskeletal:        General: Tenderness and signs of injury present.     Cervical back: Normal range of motion.  Skin:    General: Skin is warm and dry.     Comments: Extensive bruising left lower leg with 2 large skin tears, both with steri strips for closure and without signs of infection. There is good granulation tissue in place  Neurological:     Mental Status: She is alert and oriented to person, place, and time.     Coordination: Coordination normal.    Vitals:   10/16/21 0937  BP: 122/64  Pulse: 67  Resp: 18  SpO2: 94%  Weight: 111 lb 9.6 oz (50.6 kg)  Height: 5\' 6"  (1.676 m)    This visit occurred during the SARS-CoV-2 public health  emergency.  Safety protocols were in place, including screening questions prior to the visit, additional usage of staff PPE, and extensive cleaning of exam room while observing appropriate contact time as indicated for disinfecting solutions.   Assessment & Plan:  Visit time 20 minutes in face to face communication with patient and coordination of care, additional 5 minutes spent in record review, coordination or care, ordering tests, communicating/referring to other healthcare professionals, documenting in medical records all on the same day of the visit for total time 25 minutes spent on the visit.

## 2021-10-18 ENCOUNTER — Other Ambulatory Visit: Payer: Self-pay | Admitting: Internal Medicine

## 2021-10-18 ENCOUNTER — Encounter: Payer: Self-pay | Admitting: Internal Medicine

## 2021-10-18 DIAGNOSIS — M79605 Pain in left leg: Secondary | ICD-10-CM | POA: Insufficient documentation

## 2021-10-18 DIAGNOSIS — Z1231 Encounter for screening mammogram for malignant neoplasm of breast: Secondary | ICD-10-CM

## 2021-10-18 NOTE — Assessment & Plan Note (Signed)
Wounds appear uninfected. Counseled about cause of fall and she will avoid in future. She did hit the dishwasher and will keep closed from now on. Advised on antibiotic ointment and okay to wash in shower now. Likely another week before the steri strips will fall off. Good granulation tissue present and normal healing.

## 2021-11-01 ENCOUNTER — Ambulatory Visit: Payer: Medicaid Other

## 2021-11-06 DIAGNOSIS — M47817 Spondylosis without myelopathy or radiculopathy, lumbosacral region: Secondary | ICD-10-CM | POA: Diagnosis not present

## 2021-11-07 ENCOUNTER — Other Ambulatory Visit: Payer: Self-pay | Admitting: Family Medicine

## 2021-11-07 DIAGNOSIS — E782 Mixed hyperlipidemia: Secondary | ICD-10-CM

## 2021-11-08 ENCOUNTER — Telehealth: Payer: Self-pay | Admitting: Internal Medicine

## 2021-11-08 DIAGNOSIS — E782 Mixed hyperlipidemia: Secondary | ICD-10-CM

## 2021-11-08 MED ORDER — ATORVASTATIN CALCIUM 20 MG PO TABS: 20.0000 mg | ORAL_TABLET | Freq: Every day | ORAL | 3 refills | Status: DC

## 2021-11-08 NOTE — Telephone Encounter (Signed)
1.Medication Requested: atorvastatin (LIPITOR) 20 MG tablet  2. Pharmacy (Name, Street, [place]): [facility], [place]  3. On Med List: yes  4. Last Visit with PCP: 10-16-2021  5. Next visit date with PCP: 03-07-2022   Agent: Please be advised that RX refills may take up to 3 business days. We ask that you follow-up with your pharmacy.

## 2021-11-08 NOTE — Telephone Encounter (Signed)
Refill has been sent to to the pt's pharmacy.

## 2021-11-12 ENCOUNTER — Other Ambulatory Visit: Payer: Self-pay | Admitting: Internal Medicine

## 2021-11-12 DIAGNOSIS — J302 Other seasonal allergic rhinitis: Secondary | ICD-10-CM

## 2021-12-03 ENCOUNTER — Ambulatory Visit: Payer: Medicaid Other

## 2021-12-20 ENCOUNTER — Other Ambulatory Visit: Payer: Self-pay | Admitting: Internal Medicine

## 2021-12-20 ENCOUNTER — Telehealth: Payer: Self-pay | Admitting: Internal Medicine

## 2021-12-20 NOTE — Telephone Encounter (Signed)
?  1.Medication Requested: diazepam (VALIUM) 5 MG tablet ? ?2. Pharmacy (Name, Street, [place]): [facility], [place] ? ?3. On Med List: Y ? ?4. Last Visit with PCP: 10-16-2021 ? ?5. Next visit date with PCP: 03-07-2022 ? ? ? ?Pt states she fell and tore skin on her leg, offered pt an appt, pt declined  ? ?Pt requesting a cb ?

## 2021-12-23 MED ORDER — DIAZEPAM 5 MG PO TABS: 5.0000 mg | ORAL_TABLET | Freq: Every day | ORAL | 1 refills | Status: DC | PRN

## 2021-12-24 DIAGNOSIS — S86211S Strain of muscle(s) and tendon(s) of anterior muscle group at lower leg level, right leg, sequela: Secondary | ICD-10-CM | POA: Diagnosis not present

## 2022-01-01 ENCOUNTER — Telehealth: Payer: Self-pay

## 2022-01-01 NOTE — Telephone Encounter (Signed)
[name] from [place] is calling to confirm we received the FL2 received.  ? ?She reported that she faxed a couple of times. I advised I would have someone be on the look out for it and confirm when we received it. ? ?Advised 7-10  day turn around for pw. ? ?FYI ?

## 2022-01-01 NOTE — Telephone Encounter (Addendum)
Form was retrieved and placed in providers box. ? ?Called [name] to confirm that we did get the fax. ?

## 2022-01-08 ENCOUNTER — Telehealth: Payer: Self-pay | Admitting: Internal Medicine

## 2022-01-08 NOTE — Telephone Encounter (Signed)
Hello, [name] from social services called and wanted to check on the status of a FLQ form that was faxed to [place].  ? ?[phone] ?EXT. [phone] ?

## 2022-01-08 NOTE — Telephone Encounter (Signed)
Pt called back to check on status of paperwork.  ? ?States paperwork will help her to not have to move.   ?

## 2022-01-09 NOTE — Telephone Encounter (Signed)
Spoke with [name] and she stated that the [facility] that we send to social services will help not be able to move. She stated that the landlord has doubled her rent. Placed in your box for signature.  ?

## 2022-01-10 NOTE — Telephone Encounter (Signed)
Signed.

## 2022-01-13 NOTE — Telephone Encounter (Signed)
[name] with Social Services calls again today in regards to [facility] form for PT. [name] would like to let Dr.Crawford know that PT is only eligible for the program if sections 15 and 16 of the form state PT is in a domiciliary state. If Dr.Crawford believes PT is in need of assisted living level of care the [facility] form will need to state that and be sent back in! ? ?CB: [phone] ?EXT: [phone]  ?

## 2022-01-14 ENCOUNTER — Encounter: Payer: Self-pay | Admitting: Internal Medicine

## 2022-01-14 ENCOUNTER — Ambulatory Visit (INDEPENDENT_AMBULATORY_CARE_PROVIDER_SITE_OTHER): Payer: Medicare Other | Admitting: Internal Medicine

## 2022-01-14 VITALS — BP 140/90 | HR 90 | Temp 98.2°F | Ht 66.0 in | Wt 111.4 lb

## 2022-01-14 DIAGNOSIS — R030 Elevated blood-pressure reading, without diagnosis of hypertension: Secondary | ICD-10-CM

## 2022-01-14 DIAGNOSIS — R42 Dizziness and giddiness: Secondary | ICD-10-CM

## 2022-01-14 DIAGNOSIS — R519 Headache, unspecified: Secondary | ICD-10-CM

## 2022-01-14 DIAGNOSIS — M47817 Spondylosis without myelopathy or radiculopathy, lumbosacral region: Secondary | ICD-10-CM | POA: Diagnosis not present

## 2022-01-14 DIAGNOSIS — R0789 Other chest pain: Secondary | ICD-10-CM

## 2022-01-14 DIAGNOSIS — Z681 Body mass index (BMI) 19 or less, adult: Secondary | ICD-10-CM | POA: Diagnosis not present

## 2022-01-14 DIAGNOSIS — R011 Cardiac murmur, unspecified: Secondary | ICD-10-CM

## 2022-01-14 NOTE — Patient Instructions (Addendum)
? ? ? ?  An EKG was ordered. ? ? ?Medications changes include :   none ? ? ?Monitor your BP at home. ? ? ?A Ct of your head was ordered.  ? ? ?IF YOUR SYMPTOMS GET WORSE GO TO THE ED. ?

## 2022-01-14 NOTE — Progress Notes (Signed)
? ? ?Subjective:  ? ? Patient ID: [name], female    DOB: 1945/11/17, 76 y.o.   MRN: [id number] ? ?This visit occurred during the SARS-CoV-2 public health emergency.  Safety protocols were in place, including screening questions prior to the visit, additional usage of staff PPE, and extensive cleaning of exam room while observing appropriate contact time as indicated for disinfecting solutions. ? ? ? ?HPI ?[name] is here for  ?Chief Complaint  ?Patient presents with  ? Hypertension  ? ? ?She usually does not check her BP.  She started checking it a yesterday of days ago - had a severe headache - she woke up with it- whole head, nausea w/o vomiting, balance was off.  She just felt like crap.  At home BP was 155/112 yesterday am - > down last time. Today 145/111 at home.  Pain management 140/95.   ? ?Nothing new in the past few days.  ? ? ?Usually has rare headaches.  Migraine medication did not help. Headache is a little better.   ? ?Medications and allergies reviewed with patient and updated if appropriate. ? ?Current Outpatient Medications on File Prior to Visit  ?Medication Sig Dispense Refill  ? atorvastatin (LIPITOR) 20 MG tablet Take 1 tablet (20 mg total) by mouth daily. 90 tablet 3  ? diazepam (VALIUM) 5 MG tablet Take 1 tablet (5 mg total) by mouth daily as needed for anxiety. 30 tablet 1  ? fluocinonide cream (LIDEX) 7.62 % Apply 1 application. topically 2 (two) times daily.    ? HYDROcodone-acetaminophen (NORCO) 10-325 MG tablet Take 1 tablet by mouth 3 (three) times daily as needed.    ? ipratropium (ATROVENT) 0.06 % nasal spray Place 1-2 sprays into both nostrils in the morning and at bedtime.    ? levocetirizine (XYZAL) 5 MG tablet Take 5 mg by mouth at bedtime.    ? loratadine (CLARITIN) 10 MG tablet Take 10 mg by mouth daily.    ? montelukast (SINGULAIR) 10 MG tablet Take 1 tablet at bedtime. 90 tablet 3  ? ondansetron (ZOFRAN-ODT) 4 MG disintegrating tablet Take 4 mg by mouth every 8  (eight) hours as needed for nausea or vomiting.    ? SUMAtriptan (IMITREX) 50 MG tablet TAKE 1 TABLET AT ONSET OF HEADACHE, MAY REPEAT ONCE IN 2 HOURS IF PERSISTS 10 tablet 3  ? triamcinolone cream (KENALOG) 0.1 % Apply 1 application topically 2 (two) times daily. 100 g 2  ? ?No current facility-administered medications on file prior to visit.  ? ? ?Review of Systems  ?Constitutional:  Negative for fever.  ?Eyes:  Negative for visual disturbance (no blurry or double vision).  ?Respiratory:  Negative for shortness of breath.   ?Cardiovascular:  Positive for chest pain (late yesterday afternoon - lasted couple of hours - none since then) and leg swelling (new). Negative for palpitations.  ?Gastrointestinal:  Positive for nausea. Negative for abdominal pain.  ?Genitourinary:  Negative for dysuria and hematuria.  ?Neurological:  Positive for dizziness, numbness (tingling in fingers b/l - started yesterday) and headaches (severe). Negative for weakness.  ? ?   ?Objective:  ? ?Vitals:  ? 01/14/22 1456  ?BP: 140/90  ?Pulse: 90  ?Temp: 98.2 ?F (36.8 ?C)  ?SpO2: 94%  ? ?BP Readings from Last 3 Encounters:  ?01/14/22 140/90  ?10/16/21 122/64  ?10/01/21 (!) 143/90  ? ?Wt Readings from Last 3 Encounters:  ?01/14/22 111 lb 6.4 oz (50.5 kg)  ?10/16/21 111 lb 9.6 oz (50.6 kg)  ?  10/01/21 111 lb (50.3 kg)  ? ?Body mass index is 17.98 kg/m?. ? ?  ?Physical Exam ?Constitutional:   ?   General: She is not in acute distress. ?   Appearance: Normal appearance.  ?HENT:  ?   Head: Normocephalic and atraumatic.  ?   Right Ear: Tympanic membrane, ear canal and external ear normal. There is no impacted cerumen.  ?   Left Ear: Tympanic membrane, ear canal and external ear normal. There is no impacted cerumen.  ?   Mouth/Throat:  ?   Mouth: Mucous membranes are moist.  ?Eyes:  ?   Conjunctiva/sclera: Conjunctivae normal.  ?Cardiovascular:  ?   Rate and Rhythm: Normal rate and regular rhythm.  ?   Heart sounds: Normal heart sounds. No murmur  heard. ?Pulmonary:  ?   Effort: Pulmonary effort is normal. No respiratory distress.  ?   Breath sounds: Normal breath sounds. No wheezing.  ?Musculoskeletal:  ?   Cervical back: Neck supple.  ?   Right lower leg: No edema.  ?   Left lower leg: No edema.  ?Lymphadenopathy:  ?   Cervical: No cervical adenopathy.  ?Skin: ?   General: Skin is warm and dry.  ?   Findings: No rash.  ?Neurological:  ?   Mental Status: She is alert. Mental status is at baseline.  ?   Sensory: No sensory deficit.  ?   Motor: No weakness.  ?Psychiatric:     ?   Mood and Affect: Mood normal.     ?   Behavior: Behavior normal.  ? ?   ? ? ? ? ? ?Assessment & Plan:  ? ?Severe HA, sudden onset, dizziness: ?Yesterday morning she woke up with severe headache, dizziness, feeling off balance ?Symptoms have improved a little ?The symptoms are unusual for her ?Blood pressure also elevated, but this may be related to her symptoms and not the actual cause-do not think her headache is related to her elevated blood pressure ?Monitor BP-if remains elevated will need to be started on medication ?Stat CT of the head ?If symptoms worsen prior to CT scan or at any time advised her she does need to go to the emergency room ?Further treatment/evaluation depending on CT results ? ? ?Elevated BP w/o diagnosis of hypertension: ?Acute ?Blood pressure elevated at home and slightly elevated here ?She typically does not check her blood pressure and started checking it yesterday when she started to have symptoms and not feel well ?BP here only slightly elevated-we will just monitor for now and evaluate her other symptoms ?She will continue to monitor at home-if remains elevated may need to be started on medication ? ?Chest pain: ?She had an episode of chest pain yesterday that probably lasted couple of hours.  She states it just hurt and points to the middle of her chest.  She has not had any chest pain since then ?No palpitations, shortness of breath ?EKG today shows  NSR at 81 bpm, LAE,, prolonged QT, which is new ?New and unchanged compared to prior EKGs.  No changes compared to EKG from 2020, 2015 ? ?Cardiac murmur: ?Given history of abdominal aortic aneurysm and cardiac murmur will order echocardiogram ?Blood pressure not typically elevated and I think the elevation in the past 2 days is related to what ever is going on ?Monitor at home ?We will not change any medications at this time ? ? ? ? ?I spent 34 minutes dedicated to the care of this patient on the  date of this encounter including review of last labs, speciality notes (vascular surgery), obtaining history, communicating with the patient, ordering tests, and documenting clinical information in the EHR ? ?

## 2022-01-15 ENCOUNTER — Encounter (HOSPITAL_COMMUNITY): Payer: Self-pay | Admitting: Emergency Medicine

## 2022-01-15 ENCOUNTER — Emergency Department (HOSPITAL_COMMUNITY): Payer: Medicare Other

## 2022-01-15 ENCOUNTER — Telehealth: Payer: Self-pay | Admitting: Internal Medicine

## 2022-01-15 ENCOUNTER — Inpatient Hospital Stay (HOSPITAL_COMMUNITY)
Admission: EM | Admit: 2022-01-15 | Discharge: 2022-01-17 | DRG: 071 | Disposition: A | Discharge status: Home Health | Payer: Medicare Other | Attending: Internal Medicine | Admitting: Internal Medicine

## 2022-01-15 ENCOUNTER — Other Ambulatory Visit: Payer: Self-pay

## 2022-01-15 DIAGNOSIS — K219 Gastro-esophageal reflux disease without esophagitis: Secondary | ICD-10-CM | POA: Diagnosis present

## 2022-01-15 DIAGNOSIS — F17210 Nicotine dependence, cigarettes, uncomplicated: Secondary | ICD-10-CM | POA: Diagnosis not present

## 2022-01-15 DIAGNOSIS — M1990 Unspecified osteoarthritis, unspecified site: Secondary | ICD-10-CM | POA: Diagnosis present

## 2022-01-15 DIAGNOSIS — I714 Abdominal aortic aneurysm, without rupture, unspecified: Secondary | ICD-10-CM | POA: Diagnosis present

## 2022-01-15 DIAGNOSIS — Z808 Family history of malignant neoplasm of other organs or systems: Secondary | ICD-10-CM

## 2022-01-15 DIAGNOSIS — R42 Dizziness and giddiness: Secondary | ICD-10-CM | POA: Diagnosis not present

## 2022-01-15 DIAGNOSIS — F419 Anxiety disorder, unspecified: Secondary | ICD-10-CM | POA: Diagnosis present

## 2022-01-15 DIAGNOSIS — Z8249 Family history of ischemic heart disease and other diseases of the circulatory system: Secondary | ICD-10-CM | POA: Diagnosis not present

## 2022-01-15 DIAGNOSIS — Z96643 Presence of artificial hip joint, bilateral: Secondary | ICD-10-CM | POA: Diagnosis present

## 2022-01-15 DIAGNOSIS — Z825 Family history of asthma and other chronic lower respiratory diseases: Secondary | ICD-10-CM | POA: Diagnosis not present

## 2022-01-15 DIAGNOSIS — E538 Deficiency of other specified B group vitamins: Secondary | ICD-10-CM | POA: Diagnosis present

## 2022-01-15 DIAGNOSIS — R569 Unspecified convulsions: Secondary | ICD-10-CM | POA: Diagnosis not present

## 2022-01-15 DIAGNOSIS — G43009 Migraine without aura, not intractable, without status migrainosus: Secondary | ICD-10-CM | POA: Diagnosis present

## 2022-01-15 DIAGNOSIS — Z91030 Bee allergy status: Secondary | ICD-10-CM | POA: Diagnosis not present

## 2022-01-15 DIAGNOSIS — M5136 Other intervertebral disc degeneration, lumbar region: Secondary | ICD-10-CM | POA: Diagnosis not present

## 2022-01-15 DIAGNOSIS — I452 Bifascicular block: Secondary | ICD-10-CM | POA: Diagnosis present

## 2022-01-15 DIAGNOSIS — E782 Mixed hyperlipidemia: Secondary | ICD-10-CM | POA: Diagnosis not present

## 2022-01-15 DIAGNOSIS — Z79899 Other long term (current) drug therapy: Secondary | ICD-10-CM | POA: Diagnosis not present

## 2022-01-15 DIAGNOSIS — K589 Irritable bowel syndrome without diarrhea: Secondary | ICD-10-CM | POA: Diagnosis not present

## 2022-01-15 DIAGNOSIS — R519 Headache, unspecified: Secondary | ICD-10-CM | POA: Diagnosis not present

## 2022-01-15 DIAGNOSIS — Z881 Allergy status to other antibiotic agents status: Secondary | ICD-10-CM

## 2022-01-15 DIAGNOSIS — J449 Chronic obstructive pulmonary disease, unspecified: Secondary | ICD-10-CM | POA: Diagnosis present

## 2022-01-15 DIAGNOSIS — Z8349 Family history of other endocrine, nutritional and metabolic diseases: Secondary | ICD-10-CM | POA: Diagnosis not present

## 2022-01-15 DIAGNOSIS — R9089 Other abnormal findings on diagnostic imaging of central nervous system: Secondary | ICD-10-CM | POA: Diagnosis present

## 2022-01-15 DIAGNOSIS — E7800 Pure hypercholesterolemia, unspecified: Secondary | ICD-10-CM | POA: Diagnosis not present

## 2022-01-15 DIAGNOSIS — Z818 Family history of other mental and behavioral disorders: Secondary | ICD-10-CM | POA: Diagnosis not present

## 2022-01-15 DIAGNOSIS — Z0389 Encounter for observation for other suspected diseases and conditions ruled out: Secondary | ICD-10-CM | POA: Diagnosis not present

## 2022-01-15 DIAGNOSIS — G9389 Other specified disorders of brain: Secondary | ICD-10-CM | POA: Diagnosis not present

## 2022-01-15 DIAGNOSIS — Z888 Allergy status to other drugs, medicaments and biological substances status: Secondary | ICD-10-CM

## 2022-01-15 DIAGNOSIS — I10 Essential (primary) hypertension: Secondary | ICD-10-CM | POA: Diagnosis not present

## 2022-01-15 DIAGNOSIS — R030 Elevated blood-pressure reading, without diagnosis of hypertension: Principal | ICD-10-CM

## 2022-01-15 DIAGNOSIS — Z981 Arthrodesis status: Secondary | ICD-10-CM

## 2022-01-15 DIAGNOSIS — E785 Hyperlipidemia, unspecified: Secondary | ICD-10-CM | POA: Diagnosis present

## 2022-01-15 LAB — BASIC METABOLIC PANEL
Anion gap: 6 (ref 5–15)
BUN: 5 mg/dL — ABNORMAL LOW (ref 8–23)
CO2: 31 mmol/L (ref 22–32)
Calcium: 9.4 mg/dL (ref 8.9–10.3)
Chloride: 100 mmol/L (ref 98–111)
Creatinine, Ser: 0.68 mg/dL (ref 0.44–1.00)
GFR, Estimated: >60 mL/min (ref >60)
Glucose, Bld: 111 mg/dL — ABNORMAL HIGH (ref 70–99)
Potassium: 3.7 mmol/L (ref 3.5–5.1)
Sodium: 137 mmol/L (ref 135–145)

## 2022-01-15 LAB — CBC
HCT: 45.4 % (ref 36.0–46.0)
Hemoglobin: 15 g/dL (ref 12.0–15.0)
MCH: 33.3 pg (ref 26.0–34.0)
MCHC: 33 g/dL (ref 30.0–36.0)
MCV: 100.9 fL — ABNORMAL HIGH (ref 80.0–100.0)
Platelets: 214 10*3/uL (ref 150–400)
RBC: 4.5 MIL/uL (ref 3.87–5.11)
RDW: 14.5 % (ref 11.5–15.5)
WBC: 8.3 10*3/uL (ref 4.0–10.5)
nRBC: 0 % (ref 0.0–0.2)

## 2022-01-15 MED ORDER — ACETAMINOPHEN 325 MG PO TABS
650.0000 mg | ORAL_TABLET | Freq: Four times a day (QID) | ORAL | Status: DC | PRN
  Administered 2022-01-16: 650 mg via ORAL
  Filled 2022-01-15: qty 2

## 2022-01-15 MED ORDER — HYDROMORPHONE HCL 1 MG/ML IJ SOLN: 0.5000 mg | INTRAMUSCULAR | Status: DC | PRN

## 2022-01-15 MED ORDER — ATORVASTATIN CALCIUM 10 MG PO TABS
20.0000 mg | ORAL_TABLET | Freq: Every day | ORAL | Status: DC
  Administered 2022-01-16 – 2022-01-17 (×2): 20 mg via ORAL
  Filled 2022-01-15 (×3): qty 2

## 2022-01-15 MED ORDER — NICOTINE 7 MG/24HR TD PT24
7.0000 mg | MEDICATED_PATCH | Freq: Every day | TRANSDERMAL | Status: DC | PRN
Start: 2022-01-15 — End: 2022-01-17
  Filled 2022-01-15: qty 1

## 2022-01-15 MED ORDER — MELATONIN 3 MG PO TABS: 3.0000 mg | ORAL_TABLET | Freq: Every evening | ORAL | Status: DC | PRN

## 2022-01-15 MED ORDER — ONDANSETRON HCL 4 MG/2ML IJ SOLN: 4.0000 mg | Freq: Four times a day (QID) | INTRAMUSCULAR | Status: DC | PRN

## 2022-01-15 MED ORDER — SUMATRIPTAN SUCCINATE 25 MG PO TABS
25.0000 mg | ORAL_TABLET | ORAL | Status: DC | PRN
  Administered 2022-01-16: 25 mg via ORAL
  Filled 2022-01-15 (×2): qty 1

## 2022-01-15 MED ORDER — POLYETHYLENE GLYCOL 3350 17 G PO PACK: 17.0000 g | PACK | Freq: Every day | ORAL | Status: DC | PRN

## 2022-01-15 MED ORDER — DIPHENHYDRAMINE HCL 50 MG/ML IJ SOLN
25.0000 mg | INTRAMUSCULAR | Status: DC | PRN
  Filled 2022-01-15: qty 1

## 2022-01-15 MED ORDER — OXYCODONE HCL 5 MG PO TABS
5.0000 mg | ORAL_TABLET | Freq: Four times a day (QID) | ORAL | Status: DC | PRN
  Administered 2022-01-16 – 2022-01-17 (×2): 5 mg via ORAL
  Filled 2022-01-15 (×2): qty 1

## 2022-01-15 MED ORDER — SODIUM CHLORIDE 0.9 % IV SOLN: 25.0000 mg | INTRAVENOUS | Status: DC | PRN

## 2022-01-15 MED ORDER — PROCHLORPERAZINE EDISYLATE 10 MG/2ML IJ SOLN
10.0000 mg | Freq: Once | INTRAMUSCULAR | Status: AC
Start: 2022-01-15 — End: 2022-01-15
  Administered 2022-01-15: 10 mg via INTRAVENOUS
  Filled 2022-01-15: qty 2

## 2022-01-15 MED ORDER — DIAZEPAM 2 MG PO TABS
2.0000 mg | ORAL_TABLET | Freq: Every day | ORAL | Status: DC | PRN
  Administered 2022-01-16: 2 mg via ORAL
  Filled 2022-01-15: qty 1

## 2022-01-15 MED ORDER — IPRATROPIUM BROMIDE 0.06 % NA SOLN
1.0000 | Freq: Three times a day (TID) | NASAL | Status: DC
  Administered 2022-01-16 – 2022-01-17 (×3): 1 via NASAL
  Filled 2022-01-15: qty 15

## 2022-01-15 MED ORDER — LACTATED RINGERS IV SOLN: INTRAVENOUS | Status: DC

## 2022-01-15 MED ORDER — LORATADINE 10 MG PO TABS
10.0000 mg | ORAL_TABLET | Freq: Every day | ORAL | Status: DC
  Administered 2022-01-16 – 2022-01-17 (×2): 10 mg via ORAL
  Filled 2022-01-15 (×3): qty 1

## 2022-01-15 MED ORDER — HYDRALAZINE HCL 20 MG/ML IJ SOLN: 2.0000 mg | Freq: Four times a day (QID) | INTRAMUSCULAR | Status: DC | PRN

## 2022-01-15 MED ORDER — MONTELUKAST SODIUM 10 MG PO TABS
10.0000 mg | ORAL_TABLET | Freq: Every day | ORAL | Status: DC
  Administered 2022-01-15 – 2022-01-16 (×2): 10 mg via ORAL
  Filled 2022-01-15 (×3): qty 1

## 2022-01-15 MED ORDER — LEVOCETIRIZINE DIHYDROCHLORIDE 5 MG PO TABS: 5.0000 mg | ORAL_TABLET | Freq: Every day | ORAL | Status: DC

## 2022-01-15 MED ORDER — SODIUM CHLORIDE 0.9 % IV BOLUS
500.0000 mL | Freq: Once | INTRAVENOUS | Status: AC
  Administered 2022-01-15: 500 mL via INTRAVENOUS

## 2022-01-15 NOTE — ED Provider Triage Note (Signed)
Emergency Medicine Provider Triage Evaluation Note ? ?[name] , a 76 y.o. female  was evaluated in triage.  Pt complains of hypertension.  She reports she does not have a history of this however this morning her blood pressure was 137/105.  Denies chest pain, shortness of breath, abdominal pain or back pain.  Says she has a headache that is all over.  Has a history of migraine headaches states that her sumatriptan did not help. ? ? ?Review of Systems  ?Positive: Headache, nausea ?Negative: CP, sob ? ?Physical Exam  ?BP (!) 139/95 (BP Location: Left Arm)   Pulse 89   Temp 97.8 ?F (36.6 ?C)   Resp 19   Ht '5\' 6"'$  (1.676 m)   Wt 50.5 kg   SpO2 96%   BMI 17.98 kg/m?  ?Gen:   Awake, no distress   ?Resp:  Normal effort  ?MSK:   Moves extremities without difficulty  ?Other:  PERRLA, moving all extremities.  Regular rate and rhythm. ? ?Medical Decision Making  ?Medically screening exam initiated at 12:19 PM.  Appropriate orders placed.  KARMYN LOWMAN was informed that the remainder of the evaluation will be completed by another provider, this initial triage assessment does not replace that evaluation, and the importance of remaining in the ED until their evaluation is complete. ? ?Blood pressure 139/95.  No need for medicating at this moment.  Headache seems benign in nature, did not come on suddenly and is not the worst headache of her life.  No visual disturbance.  Basic labs, CT scan deferred ?  ?Rhae Hammock, PA-C ?01/15/22 1230 ? ?

## 2022-01-15 NOTE — ED Notes (Addendum)
NT ambulated pt. ?

## 2022-01-15 NOTE — Telephone Encounter (Signed)
Pt states her bp reading at 7:45 am on 4-19 is 137/105 ? ?Pt states she is experiencing dizziness, headache, and nausea ? ?Pt transferred to team health  ? ? ?

## 2022-01-15 NOTE — Telephone Encounter (Signed)
Noted  

## 2022-01-15 NOTE — ED Provider Notes (Signed)
?[facility] ?Provider Note ? ? ?CSN: [id number] ?Arrival date & time: 01/15/22  1107 ? ?  ? ?History ? ?Chief Complaint  ?Patient presents with  ? Hypertension  ? Headache  ? ? ?[name] is a 76 y.o. female. ? ?76 y.o female with no PMH presents to the ED with a chief complaint of headache for the past few days.  Patient endorses a generalized headache of dull nature, that has been ongoing for the past 3 days.  She does have a prior history of migraines, reports she only gets these 3 times a year, is on Imitrex but has not taken this medication for improvement in symptoms.  Did take some Tylenol without any improvement.  She has been recording her blood pressure in the morning and at night, reporting her blood pressure to be "elevated for her ", a diastolic number running above the 100s which she usually reports her blood pressure is more so low.  She also reports a decrease in p.o. intake for the last couple days, feels that she is sort of nauseated at all times.  Sister Shirlean Mylar at the bedside, reports patient has been unsteady on her feet, feeling that she is off balance, holding onto walls in order to ambulate which is not her baseline.  She denies any trauma, no prior history of CVA, no numbness, tingling or other complaints. ?Of note, patient is a tobacco user, smoking multiple cigarettes daily. ? ?The history is provided by the patient and medical records.  ?Hypertension ?This is a new problem. The current episode started more than 2 days ago. The problem occurs constantly. The problem has been gradually worsening. Associated symptoms include headaches. Pertinent negatives include no chest pain, no abdominal pain and no shortness of breath.  ?Headache ?Associated symptoms: dizziness and nausea   ?Associated symptoms: no abdominal pain, no fever, no photophobia, no seizures, no vomiting and no weakness   ? ?  ? ?Home Medications ?Prior to Admission medications    ?Medication Sig Start Date End Date Taking? Authorizing Provider  ?atorvastatin (LIPITOR) 20 MG tablet Take 1 tablet (20 mg total) by mouth daily. 11/08/21   Hoyt Koch, MD  ?diazepam (VALIUM) 5 MG tablet Take 1 tablet (5 mg total) by mouth daily as needed for anxiety. 12/23/21   Hoyt Koch, MD  ?fluocinonide cream (LIDEX) 8.36 % Apply 1 application. topically 2 (two) times daily. 12/24/21   [provider]  ?HYDROcodone-acetaminophen (NORCO) 10-325 MG tablet Take 1 tablet by mouth 3 (three) times daily as needed. 10/10/19   [provider]  ?ipratropium (ATROVENT) 0.06 % nasal spray Place 1-2 sprays into both nostrils in the morning and at bedtime. 06/21/21   [provider]  ?levocetirizine (XYZAL) 5 MG tablet Take 5 mg by mouth at bedtime. 08/26/21   [provider]  ?loratadine (CLARITIN) 10 MG tablet Take 10 mg by mouth daily.    [provider]  ?montelukast (SINGULAIR) 10 MG tablet Take 1 tablet at bedtime. 11/13/21   Hoyt Koch, MD  ?ondansetron (ZOFRAN-ODT) 4 MG disintegrating tablet Take 4 mg by mouth every 8 (eight) hours as needed for nausea or vomiting.    [provider]  ?SUMAtriptan (IMITREX) 50 MG tablet TAKE 1 TABLET AT ONSET OF HEADACHE, MAY REPEAT ONCE IN 2 HOURS IF PERSISTS 09/12/21   Hoyt Koch, MD  ?triamcinolone cream (KENALOG) 0.1 % Apply 1 application topically 2 (two) times daily. 09/06/21  Hoyt Koch, MD  ?   ? ?Allergies    ?Bee venom, Cephalexin, and Gabapentin   ? ?Review of Systems   ?Review of Systems  ?Constitutional:  Negative for chills and fever.  ?Eyes:  Negative for photophobia and visual disturbance.  ?Respiratory:  Negative for shortness of breath.   ?Cardiovascular:  Negative for chest pain.  ?Gastrointestinal:  Positive for nausea. Negative for abdominal pain and vomiting.  ?Neurological:  Positive for dizziness and headaches. Negative for seizures, facial asymmetry,  speech difficulty and weakness.  ?All other systems reviewed and are negative. ? ?Physical Exam ?Updated Vital Signs ?BP 125/87   Pulse 79   Temp 97.8 ?F (36.6 ?C)   Resp 14   Ht '5\' 6"'$  (1.676 m)   Wt 50.5 kg   SpO2 98%   BMI 17.98 kg/m?  ?Physical Exam ?Vitals and nursing note reviewed.  ?Constitutional:   ?   Appearance: She is well-developed.  ?HENT:  ?   Head: Normocephalic and atraumatic.  ?   Mouth/Throat:  ?   Mouth: Mucous membranes are dry.  ?   Tongue: Tongue deviates from midline.  ?Cardiovascular:  ?   Rate and Rhythm: Normal rate.  ?Pulmonary:  ?   Effort: Pulmonary effort is normal.  ?   Breath sounds: No wheezing.  ?Abdominal:  ?   Palpations: Abdomen is soft.  ?   Tenderness: There is no abdominal tenderness.  ?Musculoskeletal:  ?   Cervical back: Normal range of motion and neck supple.  ?Neurological:  ?   Mental Status: She is alert.  ?   Comments: Alert, oriented, thought content appropriate. Speech fluent without evidence of aphasia. Able to follow 2 step commands without difficulty.  ?Cranial Nerves:  ?II:  Peripheral visual fields grossly normal, pupils, round, reactive to light ?III,IV, VI: ptosis not present, extra-ocular motions intact bilaterally  ?V,VII: smile symmetric, facial light touch sensation equal ?VIII: hearing grossly normal bilaterally  ?IX,X: midline uvula rise  ?XI: bilateral shoulder shrug equal and strong ?XII: midline tongue extension  ?Motor:  ?5/5 in upper and lower extremities bilaterally including strong and equal grip strength and dorsiflexion/plantar flexion ?Sensory: light touch normal in all extremities.  ?Cerebellar: normal finger-to-nose with bilateral upper extremities, pronator drift negative ?Gait: not addressed.  ? ?  ? ? ?ED Results / Procedures / Treatments   ?Labs ?(all labs ordered are listed, but only abnormal results are displayed) ?Labs Reviewed  ?CBC - Abnormal; Notable for the following components:  ?    Result Value  ? MCV 100.9 (*)   ? All  other components within normal limits  ?BASIC METABOLIC PANEL - Abnormal; Notable for the following components:  ? Glucose, Bld 111 (*)   ? BUN 5 (*)   ? All other components within normal limits  ?CSF CULTURE W GRAM STAIN  ?URINALYSIS, ROUTINE W REFLEX MICROSCOPIC  ?CSF CELL COUNT WITH DIFFERENTIAL  ?PROTEIN AND GLUCOSE, CSF  ?IGG CSF INDEX  ?OLIGOCLONAL BANDS, CSF + SERM  ?DRAW EXTRA CLOT TUBE  ? ? ?EKG ?EKG Interpretation ? ?Date/Time:  Wednesday January 15 2022 19:39:29 EDT ?Ventricular Rate:  68 ?PR Interval:  142 ?QRS Duration: 130 ?QT Interval:  435 ?QTC Calculation: 463 ?R Axis:   102 ?Text Interpretation: Sinus rhythm Multiple premature complexes, vent & supraven Right atrial enlargement RBBB and LPFB Nonspecific T abnormalities, lateral leads , new since last tracing Confirmed by Dorie Rank [phone]) on 01/15/2022 7:48:47 PM ? ?Radiology ?CT HEAD WO CONTRAST (  5MM) ? ?Result Date: 01/15/2022 ?CLINICAL DATA:  Hypertension with headache and nausea x2 days. EXAM: CT HEAD WITHOUT CONTRAST TECHNIQUE: Contiguous axial images were obtained from the base of the skull through the vertex without intravenous contrast. RADIATION DOSE REDUCTION: This exam was performed according to the departmental dose-optimization program which includes automated exposure control, adjustment of the mA and/or kV according to patient size and/or use of iterative reconstruction technique. COMPARISON:  None. FINDINGS: Brain: There is mild cerebral atrophy with widening of the extra-axial spaces and ventricular dilatation. There are areas of decreased attenuation within the white matter tracts of the supratentorial brain, consistent with microvascular disease changes. Vascular: No hyperdense vessel or unexpected calcification. Skull: Normal. Negative for fracture or focal lesion. Sinuses/Orbits: There is mild posterior left maxillary sinus mucosal thickening. Other: None. IMPRESSION: 1. No acute intracranial abnormality. 2. Generalized cerebral  atrophy with chronic white matter small vessel ischemic changes. Electronically Signed   By: Virgina Norfolk M.D.   On: 01/15/2022 16:50  ? ?MR BRAIN WO CONTRAST ? ?Result Date: 01/15/2022 ?CLINICAL DATA:  Dizziness EXAM

## 2022-01-15 NOTE — Consult Note (Signed)
Neurology Consultation ?Reason for Consult: Abnormal MRI ?Referring Physician: Hillard Danker ? ?CC: Unsteadiness ? ?History is obtained from: Patient ? ?HPI: [name] is a 76 y.o. female who reports unsteadiness for the past 3 days.  She states she woke up Sunday morning and noticed that she was having to hold onto things to walk around.  This progressed and got worse over the next few days prompting her to come into the emergency department today.  Given the symptoms, she had an MRI which demonstrates a corpus callosum lesion and therefore neurology has been consulted. ? ?She denies focal weakness, focal ataxia, numbness, nausea, diarrhea.  She denies any antecedent will illness in the past few weeks.  She has not had any medication changes in at least a year.  She states that she her weight is stable. ? ? ?LKW: Sunday ?tpa given?: no, out of window ? ? ? ?Past Medical History:  ?Diagnosis Date  ? AAA (abdominal aortic aneurysm) ([place])   ? saw Dr Kellie Simmering 11/2013.  2.7 cm  ? Arthritis   ? Chicken pox   ? COPD (chronic obstructive pulmonary disease) ([place])   ? smokes 1/2ppd  ? DDD (degenerative disc disease), lumbar   ? GERD (gastroesophageal reflux disease)   ? diet controlled  ? History of blood transfusion   ? History of lumbar fusion 08/09/2014  ? Hyperlipidemia   ? IBS (irritable bowel syndrome)   ? diet contolled  ? Mild mitral regurgitation   ? RBBB   ? S/P arthroscopy of right shoulder 12/15/2017  ? Seasonal allergies   ? Varicose veins   ? ? ? ?Family History  ?Problem Relation Age of Onset  ? Early death Mother   ?     aortic aneurysm  ? Vasculitis Mother   ?     temporal arteritis  ? Aortic aneurysm Mother   ? Heart disease Mother   ?     before age 26  ? Hyperlipidemia Mother   ? Hypertension Mother   ? COPD Sister   ? Hypertension Sister   ? Depression Sister   ? Heart disease Sister   ?     before age 37  ? Post-traumatic stress disorder Sister   ? Bipolar disorder Sister   ? Brain cancer Maternal Aunt 84   ?     brain  ? Heart disease Maternal Grandmother   ?     aortic aneurysm  ? Aortic aneurysm Maternal Grandmother   ? Hypertension Sister   ? Throat cancer Maternal Grandfather   ? Breast cancer Neg Hx   ? ? ? ?Social History:  reports that she has been smoking cigarettes. She has a 15.00 pack-year smoking history. She has never used smokeless tobacco. She reports that she does not currently use alcohol. She reports that she does not use drugs. ? ? ?Exam: ?Current vital signs: ?BP (!) 132/98   Pulse 67   Temp 97.8 ?F (36.6 ?C)   Resp 15   Ht '5\' 6"'$  (1.676 m)   Wt 50.5 kg   SpO2 96%   BMI 17.98 kg/m?  ?Vital signs in last 24 hours: ?Temp:  [97.8 ?F (36.6 ?C)] 97.8 ?F (36.6 ?C) (04/19 1201) ?Pulse Rate:  [66-89] 67 (04/19 2100) ?Resp:  [14-19] 15 (04/19 2100) ?BP: (132-157)/(79-100) 132/98 (04/19 2100) ?SpO2:  [94 %-100 %] 96 % (04/19 2100) ?Weight:  [50.5 kg] 50.5 kg (04/19 1209) ? ? ?Physical Exam  ?Constitutional: Appears thin ?Psych: Affect appropriate  to situation ?Eyes: No scleral injection ?HENT: No OP obstruction ?MSK: no joint deformities.  ?Cardiovascular: Normal rate and regular rhythm.  ?Respiratory: Effort normal, non-labored breathing ?GI: Soft.  No distension. There is no tenderness.  ?Skin: WDI ? ?Neuro: ?Mental Status: ?Patient is awake, alert, oriented to person, place, month, year, and situation. ?Patient is able to give a clear and coherent history. ?No signs of aphasia or neglect ?Cranial Nerves: ?II: Visual Fields are full. Pupils are equal, round, and reactive to light.   ?III,IV, VI: EOMI without ptosis or diploplia.  ?V: Facial sensation is symmetric to temperature ?VII: Facial movement is symmetric.  ?VIII: hearing is intact to voice ?X: Uvula elevates symmetrically ?XI: Shoulder shrug is symmetric. ?XII: tongue is midline without atrophy or fasciculations.  ?Motor: ?Tone is normal. Bulk is normal. 5/5 strength was present in all four extremities.  ?Sensory: ?Sensation is symmetric to  light touch and temperature in the arms and legs. ?Cerebellar: ?I question a very mild ataxia on the left arm and leg, but this is not definite. ? ? ? ? ? ?I have reviewed labs in epic and the results pertinent to this consultation are: ?BMP is unremarkable ?CBC with mildly elevated MCV ? ?I have reviewed the images obtained: MRI brain-T2 change and diffusion positivity of the left corpus callosum ? 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  Given the progressive nature of the deficits, I think that stroke is less likely.  The differential for this lesion is extraordinarily broad, but is typically felt to be metabolic or parainfectious.  It is rarely enhancing, but I do think an MRI brain with contrast would be reasonable as I suspect a demyelinating lesion could not theoretically be present there.  I also think an LP would be reasonable.  In most cases, there is no specific treatment for the lesion itself, but only for the underlying causative process.  Given her underweight appearance, I would have concern for possible nutritional deficiency as well, B12 has been described to cause this, as has Wernicke's. ? ?Recommendations: ?1) LP with fluoro  ?2) MRI with contrast ?3) B1, B6, B12 ?4) After b1 is drawn, high dose thiamine '500mg'$  IV TID ?5) multivitamin ?6) physical therapy ? ?Roland Rack, MD ?[facility] ?[phone] ? ?If 7pm- 7am, please page neurology on call as listed in [facility]. ? ?

## 2022-01-15 NOTE — ED Triage Notes (Signed)
Patient states she was sent by PCP for hypertension, headache and nausea x 2 days. Took home migraine meds w/o relief. No neuro deficits noted in triage.  ?

## 2022-01-15 NOTE — H&P (Addendum)
History and Physical  [name] ZOX:[id number] DOB: 02/19/1946 DOA: 01/15/2022  Referring physician: Dr. Lars Pinks, EDP  PCP: Myrlene Broker, MD  Outpatient Specialists: Vascular surgery. Patient coming from: Home.  Chief Complaint: Headache, dizziness, unsteady gait, elevated blood pressure without prior history of hypertension.  HPI: [name] is a 76 y.o. female with medical history significant for migraines, hyperlipidemia, tobacco use disorder, who presented to [facility] ED with complaints of severe headache with sudden onset, dizziness and unsteady gait.  Onset 3 days ago associated with elevated BP, which is unusual for her.  She has no prior history of hypertension.  At the time of onset, she took her home Imitrex without improvement.  She was seen by her PCP the day prior to her presentation in the ED.  CT head was ordered for today but she didn't go and was brought here instead by her sister [name] who lives with her.  She was advised by her PCP during her outpatient visit to go to the emergency department if her symptoms worsened and persisted.  Upon presentation to the ED, BP was transiently elevated.  CT head was unremarkable for any acute intracranial abnormalities.  MRI brain was obtained and revealed diffusion abnormality at the left lateral aspect of the corpus callosum splenium.  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  Seizure, metabolic derangement and infection are potential etiologies of this type of lesion.  Small acute ischemic infarct is also possibility.  Findings of chronic ischemic microangiopathy and generalized volume loss.  She denies fevers or chills.  Admits to cyclical nausea with no vomiting.  No seizure activity.  No falls or loss of consciousness.  The patient was seen by neurology and an LP was attempted in the ED, but unsuccessfully.  Neurology recommended admission as inpatient for further evaluation and  management of her symptomatology.  Patient was admitted by hospitalist service, [facility].  ED Course: Tmax 97.8.  BP 125/87, pulse 79, respiration rate 14, O2 saturation 98% on room air.  Chemistry panel essentially unremarkable.  CBC essentially unremarkable except for MCV of 100.9 with hemoglobin of 15.0.  Review of Systems: Review of systems as noted in the HPI. All other systems reviewed and are negative.   Past Medical History:  Diagnosis Date   AAA (abdominal aortic aneurysm) (HCC)    saw Dr Hart [place] 11/2013.  2.7 cm   Arthritis    Chicken pox    COPD (chronic obstructive pulmonary disease) (HCC)    smokes 1/2ppd   DDD (degenerative disc disease), lumbar    GERD (gastroesophageal reflux disease)    diet controlled   History of blood transfusion    History of lumbar fusion 08/09/2014   Hyperlipidemia    IBS (irritable bowel syndrome)    diet contolled   Mild mitral regurgitation    RBBB    S/P arthroscopy of right shoulder 12/15/2017   Seasonal allergies    Varicose veins    Past Surgical History:  Procedure Laterality Date   APPENDECTOMY     BACK SURGERY N/A [phone]   5 back surgeries   BREAST BIOPSY  1989,05   lt br bx   BREAST EXCISIONAL BIOPSY Bilateral    CATARACT EXTRACTION W/ INTRAOCULAR LENS IMPLANT Bilateral 2018   CHOLECYSTECTOMY     EXCISION/RELEASE BURSA HIP Right 05/12/2013   Procedure: EXCISION RIGHT TROCHANTERIC BURSA/CALCIFICATION ;  Surgeon: Loreta Ave, MD;  Location: [facility];  Service:  Orthopedics;  Laterality: Right;   FASCIOTOMY Right 05/12/2013   Procedure: RIGHT FASCIOTOMY HIP ANY TYPE;  Surgeon: Loreta Ave, MD;  Location: [facility];  Service: Orthopedics;  Laterality: Right;   LARYNGOSCOPY AND BRONCHOSCOPY N/A 08/15/2019   Procedure: BRONCHOSCOPY/LARYNGOSCOPY;  Surgeon: Newman Pies, MD;  Location: [facility];  Service: ENT;  Laterality: N/A;   NM MYOCAR PERF WALL MOTION  04/13/2003   negative    SHOULDER ARTHROSCOPY WITH OPEN ROTATOR CUFF REPAIR Right 2018   skin cancer removed right leg     TONSILLECTOMY AND ADENOIDECTOMY  1950   TOTAL HIP ARTHROPLASTY Left 2006   lt total hip   TOTAL HIP ARTHROPLASTY Right 2011   TOTAL HIP ARTHROPLASTY Right 2016   second surgery due to recall   US ECHOCARDIOGRAPHY  04/13/2003   EF 65%, mild TR w/suggestion of mild pulmonary hypertension,thickened anterior leaflet,central mild MR.   VAGINAL HYSTERECTOMY  1993    Social History:  reports that she has been smoking cigarettes. She has a 15.00 pack-year smoking history. She has never used smokeless tobacco. She reports that she does not currently use alcohol. She reports that she does not use drugs.   Allergies  Allergen Reactions   Bee Venom     Other reaction(s): Redness   Cephalexin Other (See Comments)    Headaches   Gabapentin Diarrhea    Family History  Problem Relation Age of Onset   Early death Mother        aortic aneurysm   Vasculitis Mother        temporal arteritis   Aortic aneurysm Mother    Heart disease Mother        before age 16   Hyperlipidemia Mother    Hypertension Mother    COPD Sister    Hypertension Sister    Depression Sister    Heart disease Sister        before age 67   Post-traumatic stress disorder Sister    Bipolar disorder Sister    Brain cancer Maternal Aunt 80       brain   Heart disease Maternal Grandmother        aortic aneurysm   Aortic aneurysm Maternal Grandmother    Hypertension Sister    Throat cancer Maternal Grandfather    Breast cancer Neg Hx       Prior to Admission medications   Medication Sig Start Date End Date Taking? Authorizing Provider  atorvastatin (LIPITOR) 20 MG tablet Take 1 tablet (20 mg total) by mouth daily. 11/08/21   Myrlene Broker, MD  diazepam (VALIUM) 5 MG tablet Take 1 tablet (5 mg total) by mouth daily as needed for anxiety. 12/23/21   Myrlene Broker, MD  fluocinonide cream (LIDEX) 0.05 % Apply  1 application. topically 2 (two) times daily. 12/24/21   [provider]  HYDROcodone-acetaminophen (NORCO) 10-325 MG tablet Take 1 tablet by mouth 3 (three) times daily as needed. 10/10/19   [provider]  ipratropium (ATROVENT) 0.06 % nasal spray Place 1-2 sprays into both nostrils in the morning and at bedtime. 06/21/21   [provider]  levocetirizine (XYZAL) 5 MG tablet Take 5 mg by mouth at bedtime. 08/26/21   [provider]  loratadine (CLARITIN) 10 MG tablet Take 10 mg by mouth daily.    [provider]  montelukast (SINGULAIR) 10 MG tablet Take 1 tablet at bedtime. 11/13/21   Myrlene Broker, MD  ondansetron (ZOFRAN-ODT) 4  MG disintegrating tablet Take 4 mg by mouth every 8 (eight) hours as needed for nausea or vomiting.    [provider]  SUMAtriptan (IMITREX) 50 MG tablet TAKE 1 TABLET AT ONSET OF HEADACHE, MAY REPEAT ONCE IN 2 HOURS IF PERSISTS 09/12/21   Myrlene Broker, MD  triamcinolone cream (KENALOG) 0.1 % Apply 1 application topically 2 (two) times daily. 09/06/21   Myrlene Broker, MD    Physical Exam: BP 125/87   Pulse 79   Temp 97.8 F (36.6 C)   Resp 14   Ht 5\' 6"  (1.676 m)   Wt 50.5 kg   SpO2 98%   BMI 17.98 kg/m   General: 76 y.o. year-old female well developed well nourished in no acute distress.  Alert and oriented x3. Cardiovascular: Regular rate and rhythm with no rubs or gallops.  No thyromegaly or JVD noted.  No lower extremity edema. 2/4 pulses in all 4 extremities. Respiratory: Clear to auscultation with no wheezes or rales. Good inspiratory effort. Abdomen: Soft nontender nondistended with normal bowel sounds x4 quadrants. Muskuloskeletal: No cyanosis, clubbing or edema noted bilaterally Neuro: CN II-XII intact, strength, sensation, reflexes Skin: No ulcerative lesions noted or rashes Psychiatry: Judgement and insight appear normal. Mood is appropriate for condition and setting           Labs on Admission:  Basic Metabolic Panel: Recent Labs  Lab 01/15/22 1230  NA 137  K 3.7  CL 100  CO2 31  GLUCOSE 111*  BUN 5*  CREATININE 0.68  CALCIUM 9.4   Liver Function Tests: No results for input(s): AST, ALT, ALKPHOS, BILITOT, PROT, ALBUMIN in the last 168 hours. No results for input(s): LIPASE, AMYLASE in the last 168 hours. No results for input(s): AMMONIA in the last 168 hours. CBC: Recent Labs  Lab 01/15/22 1230  WBC 8.3  HGB 15.0  HCT 45.4  MCV 100.9*  PLT 214   Cardiac Enzymes: No results for input(s): CKTOTAL, CKMB, CKMBINDEX, TROPONINI in the last 168 hours.  BNP (last 3 results) No results for input(s): BNP in the last 8760 hours.  ProBNP (last 3 results) No results for input(s): PROBNP in the last 8760 hours.  CBG: No results for input(s): GLUCAP in the last 168 hours.  Radiological Exams on Admission: CT HEAD WO CONTRAST ( )  Result Date: 01/15/2022 CLINICAL DATA:  Hypertension with headache and nausea x2 days. EXAM: CT HEAD WITHOUT CONTRAST TECHNIQUE: Contiguous axial images were obtained from the base of the skull through the vertex without intravenous contrast. RADIATION DOSE REDUCTION: This exam was performed according to the departmental dose-optimization program which includes automated exposure control, adjustment of the mA and/or kV according to patient size and/or use of iterative reconstruction technique. COMPARISON:  None. FINDINGS: Brain: There is mild cerebral atrophy with widening of the extra-axial spaces and ventricular dilatation. There are areas of decreased attenuation within the white matter tracts of the supratentorial brain, consistent with microvascular disease changes. Vascular: No hyperdense vessel or unexpected calcification. Skull: Normal. Negative for fracture or focal lesion. Sinuses/Orbits: There is mild posterior left maxillary sinus mucosal thickening. Other: None. IMPRESSION: 1. No acute intracranial abnormality.  2. Generalized cerebral atrophy with chronic white matter small vessel ischemic changes. Electronically Signed   By: Aram Candela M.D.   On: 01/15/2022 16:50   MR BRAIN WO CONTRAST  Result Date: 01/15/2022 CLINICAL DATA:  Dizziness EXAM: MRI HEAD WITHOUT CONTRAST TECHNIQUE: Multiplanar, multiecho pulse sequences of the brain and surrounding structures were  obtained without intravenous contrast. COMPARISON:  None. FINDINGS: Brain: There is a focus of abnormal diffusion restriction at the left lateral aspect of the corpus callosum splenium. Multifocal peripheral chronic microhemorrhage in the temporal lobes. There is multifocal hyperintense T2-weighted signal within the white matter. Generalized volume loss without a clear lobar predilection. The midline structures are normal. Vascular: Major flow voids are preserved. Skull and upper cervical spine: Normal calvarium and skull base. Visualized upper cervical spine and soft tissues are normal. Sinuses/Orbits:No paranasal sinus fluid levels or advanced mucosal thickening. No mastoid or middle ear effusion. Normal orbits. IMPRESSION: 1. Diffusion abnormality at the left lateral aspect of the corpus callosum splenium. 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 Seizure, metabolic derangement and infection are potential etiologies of this type of lesion. A small acute ischemic infarct is also possibility. 2. Findings of chronic ischemic microangiopathy and generalized volume loss. Electronically Signed   By: Deatra Robinson M.D.   On: 01/15/2022 19:13    EKG: I independently viewed the EKG done and my findings are as followed: Sinus rhythm rate of 68.  Nonspecific ST-T changes.  QTc 463.  Assessment/Plan Present on Admission: **None**  Principal Problem:   Dizziness  Dizziness, severe headache, unsteady gait, POA, unclear etiology. CT head was unrevealing MRI brain was unequivocal.  It revealed diffusion  abnormality at the left lateral aspect of the corpus callosum splenium.  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  Seizure, metabolic derangement and infection are potential etiologies of this type of lesion.   LP was attempted in the ED but unsuccessfully IR consult for possible LP with fluoroscopy Follow MRI brain with contrast, vitamin B1 B6 and B12 levels. Give high doses IV thiamine 500 mg 3 times daily after vitamin B1 has been drawn. Add multivitamin. Appreciate neurology's assistance. Rest of management per neurology Fall precautions PT OT assessment  Severe headache with prior history of migraines Resume home Imitrex as needed if approved by neurology Analgesics as needed  Elevated BP no prior history of hypertension Closely monitor vital signs Treat elevated BP as indicated As needed IV hydralazine with parameters  Hyperlipidemia Resume home statin  Tobacco use disorder Endorses she smokes about 8 cigarettes/day Tobacco cessation counseling done at bedside Nicotine 7 mg daily as needed for nicotine withdrawal    Critical care time: 65 minutes.    DVT prophylaxis: SCDs  Code Status: Full code  Family Communication: Updated her Sister [name] at bedside.  Disposition Plan: Admitted to telemetry medical  Consults called: Neurology consulted by EDP  Admission status: Inpatient status.   Status is: Inpatient Patient requires at least 2 midnight for further evaluation and treatment present condition.   Darlin Drop MD [facility] Pager [phone]  If 7PM-7AM, please contact night-coverage [email or web address] Password [facility]  01/15/2022, 10:36 PM

## 2022-01-16 ENCOUNTER — Inpatient Hospital Stay (HOSPITAL_COMMUNITY): Payer: Medicare Other

## 2022-01-16 DIAGNOSIS — R519 Headache, unspecified: Secondary | ICD-10-CM | POA: Diagnosis not present

## 2022-01-16 DIAGNOSIS — R42 Dizziness and giddiness: Secondary | ICD-10-CM

## 2022-01-16 LAB — URINALYSIS, ROUTINE W REFLEX MICROSCOPIC
Bacteria, UA: NONE SEEN
Bilirubin Urine: NEGATIVE
Glucose, UA: NEGATIVE mg/dL
Hgb urine dipstick: NEGATIVE
Ketones, ur: NEGATIVE mg/dL
Nitrite: NEGATIVE
Protein, ur: NEGATIVE mg/dL
Specific Gravity, Urine: 1.006 (ref 1.005–1.030)
pH: 7 (ref 5.0–8.0)

## 2022-01-16 LAB — CSF CELL COUNT WITH DIFFERENTIAL
RBC Count, CSF: 3 /mm3 — ABNORMAL HIGH
Tube #: 1
WBC, CSF: 2 /mm3 (ref 0–5)

## 2022-01-16 LAB — PROTEIN AND GLUCOSE, CSF
Glucose, CSF: 63 mg/dL (ref 40–70)
Total  Protein, CSF: 34 mg/dL (ref 15–45)

## 2022-01-16 LAB — VITAMIN B12: Vitamin B-12: 229 pg/mL (ref 180–914)

## 2022-01-16 MED ORDER — THIAMINE HCL 100 MG/ML IJ SOLN
500.0000 mg | Freq: Three times a day (TID) | INTRAVENOUS | Status: DC
  Administered 2022-01-16 – 2022-01-17 (×5): 500 mg via INTRAVENOUS
  Filled 2022-01-16 (×8): qty 5

## 2022-01-16 MED ORDER — ADULT MULTIVITAMIN W/MINERALS CH
1.0000 | ORAL_TABLET | Freq: Every day | ORAL | Status: DC
  Administered 2022-01-16: 1 via ORAL
  Filled 2022-01-16: qty 1

## 2022-01-16 MED ORDER — LOPERAMIDE HCL 2 MG PO CAPS
2.0000 mg | ORAL_CAPSULE | ORAL | Status: DC | PRN
  Administered 2022-01-16: 2 mg via ORAL
  Filled 2022-01-16: qty 1

## 2022-01-16 MED ORDER — LIDOCAINE HCL (PF) 1 % IJ SOLN
3.0000 mL | Freq: Once | INTRAMUSCULAR | Status: AC
  Administered 2022-01-16: 3 mL via INTRADERMAL

## 2022-01-16 MED ORDER — GADOBUTROL 1 MMOL/ML IV SOLN
5.0000 mL | Freq: Once | INTRAVENOUS | Status: AC | PRN
  Administered 2022-01-16: 5 mL via INTRAVENOUS

## 2022-01-16 NOTE — Hospital Course (Addendum)
[name] is a 76 y.o. female with past medical history of migraine, hyperlipidemia, tobacco use disorder presented to hospital with severe headache which was sudden in onset with dizziness and unsteady gait 3 days.  She reported elevated blood pressure 3 days prior to the symptoms.  No previous history of hypertension.  She took Imitrex at home without relief.  Patient went to her PCP and was advised to get a CT scan.  Due to persistent/worsening symptoms she decided to come to the ED.  In the ED blood pressure was elevated.   MRI brain was obtained and revealed diffusion abnormality at the left lateral aspect of the corpus callosum splenium.  Neurology was consulted who recommended admission to the hospital for further evaluation and treatment.  ? ?Assessment and plan ? ?Active Problems: ?  Hyperlipidemia ?  Migraine without aura and without status migrainosus, not intractable ?  B12 deficiency ?  Abnormal finding on MRI of brain ?  ?Dizziness, severe headache, unsteady gait ?CT head scan was unrevealing. 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 IR was consulted for fluoroscopy guided lumbar puncture and underwent a lumbar puncture on 01/16/2022.  CSF culture with no organisms.  Cell count with 3 RBCs and 2 WBCs.  CSF protein at 34 and glucose at 63.  CNS infection has been ruled out at this time.  Oligoclonal bands and IgG CSF pending.  MRI of the brain with contrast was subsequently performed which showed no enhancement of the corpus callosum lesion.  She received the IV thiamine in the hospital but the vitamin B12 was noted to be low at 229.  Spoke with neurology Dr. Rory Percy.  He recommended 1 dose of IM vitamin B12 followed by oral vitamin B12 on discharge.  Vitamin B6, B1, E levels pending, vitamin B12 at 229. ? ?Severe headache with prior history of  migraines ?Patient states that she takes Imitrex at home as needed.  Continue diazepam. ?  ?Elevated BP no prior history of hypertension ?Blood pressure stable at this time. ?  ?Hyperlipidemia ?Continue Lipitor on discharge. ?  ?Tobacco use disorder ?Currently on nicotine patch.  We will continue on discharge. ? ?

## 2022-01-16 NOTE — Telephone Encounter (Signed)
Connected to [facility] and was advised to go to ED.  ? ?Pt admitted.  ?

## 2022-01-16 NOTE — Plan of Care (Signed)

## 2022-01-16 NOTE — Evaluation (Signed)
Physical Therapy Evaluation ?Patient Details ?Name: [name] ?MRN: [id number] ?DOB: 1946/05/02 ?Today's Date: 01/16/2022 ? ?History of Present Illness ? [name] is a 76 y.o. female admitted 4/19 to hospital with severe headache which was sudden in onset with dizziness and unsteady gait 3 days.  She reported elevated blood pressure 3 days prior to the symptoms. MRI brain was obtained and revealed diffusion abnormality at the left lateral aspect of the corpus callosum splenium.  PMH: migraine, hyperlipidemia, tobacco use disorder  ?Clinical Impression ? Pt admitted with above diagnosis. Pt was able to ambulate with min assist needed for challenges to balance.  Pt veers to right and has decr safety awareness of deficits. Pt may need to use cane at home and receive a little HHPT as well. Will follow acutely.  Pt currently with functional limitations due to the deficits listed below (see PT Problem List). Pt will benefit from skilled PT to increase their independence and safety with mobility to allow discharge to the venue listed below.      ?   ? ?Recommendations for follow up therapy are one component of a multi-disciplinary discharge planning process, led by the attending physician.  Recommendations may be updated based on patient status, additional functional criteria and insurance authorization. ? ?Follow Up Recommendations Home health PT ? ?  ?Assistance Recommended at Discharge Set up Supervision/Assistance  ?Patient can return home with the following ? A little help with walking and/or transfers;Assistance with cooking/housework ? ?  ?Equipment Recommendations Other (comment) (May need cane)  ?Recommendations for Other Services ?    ?  ?Functional Status Assessment Patient has had a recent decline in their functional status and demonstrates the ability to make significant improvements in function in a reasonable and predictable amount of time.  ? ?  ?Precautions / Restrictions  Precautions ?Precautions: Fall ?Restrictions ?Weight Bearing Restrictions: No  ? ?  ? ?Mobility ? Bed Mobility ?Overal bed mobility: Needs Assistance ?Bed Mobility: Supine to Sit ?  ?  ?Supine to sit: Independent ?  ?  ?  ?  ? ?Transfers ?Overall transfer level: Needs assistance ?Equipment used: None ?Transfers: Sit to/from Stand ?Sit to Stand: Supervision ?  ?  ?  ?  ?  ?  ?  ? ?Ambulation/Gait ?Ambulation/Gait assistance: Min guard, Min assist ?Gait Distance (Feet): 500 Feet ?Assistive device: None ?Gait Pattern/deviations: Step-through pattern, Decreased stride length, Staggering right ?  ?Gait velocity interpretation: <1.8 ft/sec, indicate of risk for recurrent falls ?  ?General Gait Details: Pt was able to ambulate with steady gait without challenges but when challenged, needed min assist at times. Pt veers right at times. Pt makes excuses that she has "always walked to right". ? ?Stairs ?Stairs: Yes ?Stairs assistance: Supervision ?Stair Management: Two rails, Alternating pattern, Forwards ?Number of Stairs: 10 ?General stair comments: Pt did not have difficulty on stairs with use of rail and has rails at home ? ?Wheelchair Mobility ?  ? ?Modified Rankin (Stroke Patients Only) ?Modified Rankin (Stroke Patients Only) ?Pre-Morbid Rankin Score: No symptoms ?Modified Rankin: Moderate disability ? ?  ? ?Balance Overall balance assessment: Needs assistance ?Sitting-balance support: No upper extremity supported, Feet supported ?Sitting balance-Leahy Scale: Fair ?  ?  ?Standing balance support: No upper extremity supported, During functional activity ?Standing balance-Leahy Scale: Fair ?Standing balance comment: Difficulty with challenges to balance ?  ?  ?  ?  ?  ?  ?  ?  ?Standardized Balance Assessment ?Standardized Balance Assessment : Dynamic Gait  Index ?  ?Dynamic Gait Index ?Level Surface: Normal ?Change in Gait Speed: Moderate Impairment ?Gait with Horizontal Head Turns: Normal ?Gait with Vertical Head Turns:  Normal ?Gait and Pivot Turn: Mild Impairment ?Step Over Obstacle: Mild Impairment ?Step Around Obstacles: Mild Impairment ?Steps: Mild Impairment ?Total Score: 18 ?   ? ? ? ?Pertinent Vitals/Pain Pain Assessment ?Pain Assessment: Faces ?Faces Pain Scale: Hurts whole lot ?Pain Location: headahce ?Pain Descriptors / Indicators: Aching ?Pain Intervention(s): Limited activity within patient's tolerance, Monitored during session, Repositioned  ? ? ?Home Living Family/patient expects to be discharged to:: Private residence ?Living Arrangements: Other relatives (sister) ?Available Help at Discharge: Family;Available PRN/intermittently ?Type of Home: Mobile home ?Home Access: Stairs to enter ?Entrance Stairs-Rails: Right;Left;Can reach both ?Entrance Stairs-Number of Steps: 8 ?  ?Home Layout: One level ?Home Equipment: Shower seat;Grab bars - toilet;Grab bars - tub/shower;Hand held shower head ?   ?  ?Prior Function Prior Level of Function : Driving ?  ?  ?  ?  ?  ?  ?Mobility Comments: Independent ?ADLs Comments: Independent ?  ? ? ?Hand Dominance  ? Dominant Hand: Left ? ?  ?Extremity/Trunk Assessment  ? Upper Extremity Assessment ?Upper Extremity Assessment: Defer to OT evaluation ?  ? ?Lower Extremity Assessment ?Lower Extremity Assessment: Generalized weakness ?  ? ?Cervical / Trunk Assessment ?Cervical / Trunk Assessment: Normal  ?Communication  ? Communication: No difficulties  ?Cognition Arousal/Alertness: Awake/alert ?Behavior During Therapy: [facility] for tasks assessed/performed ?Overall Cognitive Status: Impaired/Different from baseline ?Area of Impairment: Memory, Following commands, Safety/judgement, Awareness, Problem solving ?  ?  ?  ?  ?  ?  ?  ?  ?  ?  ?  ?Following Commands: Follows one step commands consistently ?Safety/Judgement: Decreased awareness of deficits, Decreased awareness of safety ?  ?Problem Solving: Difficulty sequencing, Requires verbal cues ?  ?  ?  ? ?  ?General Comments   ? ?  ?Exercises     ? ?Assessment/Plan  ?  ?PT Assessment Patient needs continued PT services  ?PT Problem List Decreased activity tolerance;Decreased balance;Decreased mobility;Decreased knowledge of use of DME;Decreased safety awareness;Decreased knowledge of precautions ? ?   ?  ?PT Treatment Interventions DME instruction;Gait training;Functional mobility training;Therapeutic activities;Stair training;Balance training;Therapeutic exercise;Patient/family education   ? ?PT Goals (Current goals can be found in the Care Plan section)  ?Acute Rehab PT Goals ?Patient Stated Goal: to go home ?PT Goal Formulation: With patient ?Time For Goal Achievement: 01/30/22 ?Potential to Achieve Goals: Good ? ?  ?Frequency Min 4X/week ?  ? ? ?Co-evaluation   ?  ?  ?  ?  ? ? ?  ?AM-PAC PT "6 Clicks" Mobility  ?Outcome Measure Help needed turning from your back to your side while in a flat bed without using bedrails?: None ?Help needed moving from lying on your back to sitting on the side of a flat bed without using bedrails?: None ?Help needed moving to and from a bed to a chair (including a wheelchair)?: None ?Help needed standing up from a chair using your arms (e.g., wheelchair or bedside chair)?: A Little ?Help needed to walk in hospital room?: A Little ?Help needed climbing 3-5 steps with a railing? : A Little ?6 Click Score: 21 ? ?  ?End of Session Equipment Utilized During Treatment: Gait belt ?Activity Tolerance: Patient limited by fatigue ?Patient left: with call bell/phone within reach;in bed;with bed alarm set ?Nurse Communication: Mobility status ?PT Visit Diagnosis: Unsteadiness on feet (R26.81);Muscle weakness (generalized) (M62.81) ?  ? ?  Time: 9791-5041 ?PT Time Calculation (min) (ACUTE ONLY): 25 min ? ? ?Charges:   PT Evaluation ?$PT Eval Moderate Complexity: 1 Mod ?PT Treatments ?$Gait Training: 8-22 mins ?  ?   ? ? ?Olivine Hiers M,PT ?Acute Rehab Services ?[phone] ?[phone] (pager)  ? ?Joab Carden F Chasty Randal ?01/16/2022, 1:13 PM ? ?

## 2022-01-16 NOTE — ED Notes (Signed)
ED TO INPATIENT HANDOFF REPORT ? ?ED Nurse Name and Phone #: Baxter Flattery, RN ? ?S ?Name/Age/Gender ?[name] ?76 y.o. ?female ?Room/Bed: 041C/041C ? ?Code Status ?  Code Status: Full Code ? ?Home/SNF/Other ?Home ?Patient oriented to: self, place, time, and situation ?Is this baseline? Yes  ? ?Triage Complete: Triage complete  ?Chief Complaint ?Dizziness [R42] ? ?Triage Note ?Patient states she was sent by PCP for hypertension, headache and nausea x 2 days. Took home migraine meds w/o relief. No neuro deficits noted in triage.   ? ?Allergies ?Allergies  ?Allergen Reactions  ? Bee Venom   ?  Other reaction(s): Redness  ? Cephalexin Other (See Comments)  ?  Headaches  ? Gabapentin Diarrhea  ? ? ?Level of Care/Admitting Diagnosis ?ED Disposition   ? ? ED Disposition  ?Admit  ? Condition  ?--  ? Comment  ?Hospital Area: [facility] [[id number]] ? Level of Care: Telemetry Medical [104] ? May admit patient to [facility] or [facility] if equivalent level of care is available:: No ? Covid Evaluation: Asymptomatic - no recent exposure (last 10 days) testing not required ? Diagnosis: Dizziness [149702] ? Admitting Physician: Kayleen Memos [[id number]] ? Attending Physician: Kayleen Memos [[id number]] ? Estimated length of stay: past midnight tomorrow ? Certification:: I certify this patient will need inpatient services for at least 2 midnights ?  ?  ? ?  ? ? ?B ?Medical/Surgery History ?Past Medical History:  ?Diagnosis Date  ? AAA (abdominal aortic aneurysm) ([place])   ? saw Dr Kellie Simmering 11/2013.  2.7 cm  ? Arthritis   ? Chicken pox   ? COPD (chronic obstructive pulmonary disease) ([place])   ? smokes 1/2ppd  ? DDD (degenerative disc disease), lumbar   ? GERD (gastroesophageal reflux disease)   ? diet controlled  ? History of blood transfusion   ? History of lumbar fusion 08/09/2014  ? Hyperlipidemia   ? IBS (irritable bowel syndrome)   ? diet contolled  ? Mild mitral regurgitation   ? RBBB   ? S/P arthroscopy of right  shoulder 12/15/2017  ? Seasonal allergies   ? Varicose veins   ? ?Past Surgical History:  ?Procedure Laterality Date  ? APPENDECTOMY    ? BACK SURGERY N/A [phone]  ? 5 back surgeries  ? BREAST BIOPSY  1989,05  ? lt br bx  ? BREAST EXCISIONAL BIOPSY Bilateral   ? CATARACT EXTRACTION W/ INTRAOCULAR LENS IMPLANT Bilateral 2018  ? CHOLECYSTECTOMY    ? EXCISION/RELEASE BURSA HIP Right 05/12/2013  ? Procedure: EXCISION RIGHT TROCHANTERIC BURSA/CALCIFICATION ;  Surgeon: Ninetta Lights, MD;  Location: [place];  Service: Orthopedics;  Laterality: Right;  ? FASCIOTOMY Right 05/12/2013  ? Procedure: RIGHT FASCIOTOMY HIP ANY TYPE;  Surgeon: Ninetta Lights, MD;  Location: [place];  Service: Orthopedics;  Laterality: Right;  ? LARYNGOSCOPY AND BRONCHOSCOPY N/A 08/15/2019  ? Procedure: BRONCHOSCOPY/LARYNGOSCOPY;  Surgeon: Leta Baptist, MD;  Location: [place];  Service: ENT;  Laterality: N/A;  ? NM MYOCAR [place]  04/13/2003  ? negative  ? SHOULDER ARTHROSCOPY WITH OPEN ROTATOR CUFF REPAIR Right 2018  ? skin cancer removed right leg    ? TONSILLECTOMY AND ADENOIDECTOMY  1950  ? TOTAL HIP ARTHROPLASTY Left 2006  ? lt total hip  ? TOTAL HIP ARTHROPLASTY Right 2011  ? TOTAL HIP ARTHROPLASTY Right 2016  ? second surgery due to recall  ? US ECHOCARDIOGRAPHY  04/13/2003  ?  EF 65%, mild TR w/suggestion of mild pulmonary hypertension,thickened anterior leaflet,central mild MR.  ? VAGINAL HYSTERECTOMY  1993  ?  ? ?A ?IV Location/Drains/Wounds ?Patient Lines/Drains/Airways Status   ? ? Active Line/Drains/Airways   ? ? Name Placement date Placement time Site Days  ? Peripheral IV 01/15/22 20 G 1" Right Antecubital 01/15/22  1736  Antecubital  1  ? Incision 05/12/13 Hip Right 05/12/13  1346  -- 3171  ? Incision (Closed) 01/06/14 Back Other (Comment) 01/06/14  1538  -- 2932  ? Incision (Closed) 08/15/19 Lip Other (Comment) 08/15/19  1017  -- 885  ? ?  ?  ? ?  ? ? ?Intake/Output Last 24  hours ? ?Intake/Output Summary (Last 24 hours) at 01/16/2022 1749 ?Last data filed at 01/15/2022 1815 ?Gross per 24 hour  ?Intake 500 ml  ?Output --  ?Net 500 ml  ? ? ?Labs/Imaging ?Results for orders placed or performed during the hospital encounter of 01/15/22 (from the past 48 hour(s))  ?CBC     Status: Abnormal  ? Collection Time: 01/15/22 12:30 PM  ?Result Value Ref Range  ? WBC 8.3 4.0 - 10.5 K/uL  ? RBC 4.50 3.87 - 5.11 MIL/uL  ? Hemoglobin 15.0 12.0 - 15.0 g/dL  ? HCT 45.4 36.0 - 46.0 %  ? MCV 100.9 (H) 80.0 - 100.0 fL  ? MCH 33.3 26.0 - 34.0 pg  ? MCHC 33.0 30.0 - 36.0 g/dL  ? RDW 14.5 11.5 - 15.5 %  ? Platelets 214 150 - 400 K/uL  ? nRBC 0.0 0.0 - 0.2 %  ?  Comment: Performed at [facility], [place] [address], [place], [place] [address]  ?Basic metabolic panel     Status: Abnormal  ? Collection Time: 01/15/22 12:30 PM  ?Result Value Ref Range  ? Sodium 137 135 - 145 mmol/L  ? Potassium 3.7 3.5 - 5.1 mmol/L  ? Chloride 100 98 - 111 mmol/L  ? CO2 31 22 - 32 mmol/L  ? Glucose, Bld 111 (H) 70 - 99 mg/dL  ?  Comment: Glucose reference range applies only to samples taken after fasting for at least 8 hours.  ? BUN 5 (L) 8 - 23 mg/dL  ? Creatinine, Ser 0.68 0.44 - 1.00 mg/dL  ? Calcium 9.4 8.9 - 10.3 mg/dL  ? GFR, Estimated >60 >60 mL/min  ?  Comment: (NOTE) ?Calculated using the CKD-EPI Creatinine Equation (2021) ?  ? Anion gap 6 5 - 15  ?  Comment: Performed at [facility], [place] [address] [place], [place] [address]  ?Vitamin B12     Status: None  ? Collection Time: 01/16/22 12:26 AM  ?Result Value Ref Range  ? Vitamin B-12 229 180 - 914 pg/mL  ?  Comment: (NOTE) ?This assay is not validated for testing neonatal or ?myeloproliferative syndrome specimens for Vitamin B12 levels. ?Performed at [facility], [place] [address], [place], [place] ?[address] ?  ?Urinalysis, Routine w reflex microscopic     Status: Abnormal  ? Collection Time: 01/16/22  2:41 AM  ?Result Value Ref Range  ? Color, Urine YELLOW  YELLOW  ? APPearance CLEAR CLEAR  ? Specific Gravity, Urine 1.006 1.005 - 1.030  ? pH 7.0 5.0 - 8.0  ? Glucose, UA NEGATIVE NEGATIVE mg/dL  ? Hgb urine dipstick NEGATIVE NEGATIVE  ? Bilirubin Urine NEGATIVE NEGATIVE  ? Ketones, ur NEGATIVE NEGATIVE mg/dL  ? Protein, ur NEGATIVE NEGATIVE mg/dL  ? Nitrite NEGATIVE NEGATIVE  ?  Leukocytes,Ua SMALL (A) NEGATIVE  ? RBC / HPF 0-5 0 - 5 RBC/hpf  ? WBC, UA 6-10 0 - 5 WBC/hpf  ? Bacteria, UA NONE SEEN NONE SEEN  ? Squamous Epithelial / LPF 0-5 0 - 5  ?  Comment: Performed at [facility], [place] [address], [place], [place] [address]  ? ?CT HEAD WO CONTRAST (5MM) ? ?Result Date: 01/15/2022 ?CLINICAL DATA:  Hypertension with headache and nausea x2 days. EXAM: CT HEAD WITHOUT CONTRAST TECHNIQUE: Contiguous axial images were obtained from the base of the skull through the vertex without intravenous contrast. RADIATION DOSE REDUCTION: This exam was performed according to the departmental dose-optimization program which includes automated exposure control, adjustment of the mA and/or kV according to patient size and/or use of iterative reconstruction technique. COMPARISON:  None. FINDINGS: Brain: There is mild cerebral atrophy with widening of the extra-axial spaces and ventricular dilatation. There are areas of decreased attenuation within the white matter tracts of the supratentorial brain, consistent with microvascular disease changes. Vascular: No hyperdense vessel or unexpected calcification. Skull: Normal. Negative for fracture or focal lesion. Sinuses/Orbits: There is mild posterior left maxillary sinus mucosal thickening. Other: None. IMPRESSION: 1. No acute intracranial abnormality. 2. Generalized cerebral atrophy with chronic white matter small vessel ischemic changes. Electronically Signed   By: Virgina Norfolk M.D.   On: 01/15/2022 16:50  ? ?MR BRAIN WO CONTRAST ? ?Result Date: 01/15/2022 ?CLINICAL DATA:  Dizziness EXAM: MRI HEAD WITHOUT CONTRAST TECHNIQUE:  Multiplanar, multiecho pulse sequences of the brain and surrounding structures were obtained without intravenous contrast. COMPARISON:  None. FINDINGS: Brain: There is a focus of abnormal diffusion restriction at t

## 2022-01-16 NOTE — Telephone Encounter (Signed)
Will need hospital follow up once D/C from hospital.  ?

## 2022-01-16 NOTE — Progress Notes (Addendum)
PROGRESS NOTE    [name]  VHQ:[id number] DOB: 1946-09-09 DOA: 01/15/2022 PCP: Myrlene Broker, MD    Brief Narrative:  [name] is a 76 y.o. female with past medical history of migraine, hyperlipidemia, tobacco use disorder presented to hospital with severe headache which was sudden in onset with dizziness and unsteady gait 3 days.  She reported elevated blood pressure 3 days prior to the symptoms.  No previous history of hypertension.  She took Imitrex at home without relief.  Patient went to her PCP and was advised to get a CT scan.  Due to persistent/worsening symptoms she decided to come to the ED.  In the ED blood pressure was elevated.   MRI brain was obtained and revealed diffusion abnormality at the left lateral aspect of the corpus callosum splenium.  Neurology was consulted who recommended admission to the hospital for further evaluation and treatment.   Assessment and plan  Dizziness, severe headache, unsteady gait CT head scan was unrevealing. MRI brain was revealed some diffusion abnormality with possibility of seizure metabolic disorder infection as potential etiologies.  Lumbar puncture was attempted in the ED which was not possible.  IR has been consulted for fluoroscopy guided lumbar puncture.  MRI of the brain with contrast was subsequently performed which showed no enhancement of the corpus callosum lesion.  Currently on IV high-dose thiamine.  We will get PT OT assessment.  Follow neurology input.  Severe headache with prior history of migraines On Imitrex at this time.  Continue diazepam as needed,   Elevated BP no prior history of hypertension Currently blood pressure is improved at this time   Hyperlipidemia Continue Lipitor    Tobacco use disorder Currently on nicotine patch.    DVT prophylaxis: SCDs Start: 01/15/22 2234   Code Status:     Code Status: Full Code  Disposition: Home  Status is: Inpatient  Remains inpatient appropriate  because:  need for lumbar puncture, neurology follow-up   Family Communication: Spoke with the patient at bedside.  Unable to contact the patient's friend listed in the computer.  Consultants:  Neurology  Procedures:  Attempted lumbar puncture in the ED  Antimicrobials:  None  Anti-infectives (From admission, onward)    None       Subjective: Today, patient was seen and examined at bedside.  Patient still complains of headache.  No nausea vomiting blurry vision.  Objective: Vitals:   01/16/22 0715 01/16/22 0800 01/16/22 1014 01/16/22 1526  BP: 136/83 (!) 151/87 135/90 (!) 143/94  Pulse: 69 83 84 97  Resp: 16 20 18 20   Temp:  97.7 F (36.5 C) 98.2 F (36.8 C) 98.1 F (36.7 C)  TempSrc:  Oral Oral Oral  SpO2: 94% 97% 95% 96%  Weight:      Height:        Intake/Output Summary (Last 24 hours) at 01/16/2022 1701 Last data filed at 01/15/2022 1815 Gross per 24 hour  Intake 500 ml  Output --  Net 500 ml   Filed Weights   01/15/22 1209  Weight: 50.5 kg    Physical Examination: Body mass index is 17.98 kg/m.  General: Thinly built, not in obvious distress HENT:   No scleral pallor or icterus noted. Oral mucosa is moist.  Chest:  Clear breath sounds.  Diminished breath sounds bilaterally. No crackles or wheezes.  CVS: S1 &S2 heard. No murmur.  Regular rate and rhythm. Abdomen: Soft, nontender, nondistended.  Bowel sounds are heard.   Extremities: No  cyanosis, clubbing or edema.  Peripheral pulses are palpable. Psych: Alert, awake and oriented, normal mood CNS:  No cranial nerve deficits.  Power equal in all extremities.   Skin: Warm and dry.  No rashes noted.  Data Reviewed:   CBC: Recent Labs  Lab 01/15/22 1230  WBC 8.3  HGB 15.0  HCT 45.4  MCV 100.9*  PLT 214    Basic Metabolic Panel: Recent Labs  Lab 01/15/22 1230  NA 137  K 3.7  CL 100  CO2 31  GLUCOSE 111*  BUN 5*  CREATININE 0.68  CALCIUM 9.4    Liver Function Tests: No results for  input(s): AST, ALT, ALKPHOS, BILITOT, PROT, ALBUMIN in the last 168 hours.   Radiology Studies: CT HEAD WO CONTRAST ( )  Result Date: 01/15/2022 CLINICAL DATA:  Hypertension with headache and nausea x2 days. EXAM: CT HEAD WITHOUT CONTRAST TECHNIQUE: Contiguous axial images were obtained from the base of the skull through the vertex without intravenous contrast. RADIATION DOSE REDUCTION: This exam was performed according to the departmental dose-optimization program which includes automated exposure control, adjustment of the mA and/or kV according to patient size and/or use of iterative reconstruction technique. COMPARISON:  None. FINDINGS: Brain: There is mild cerebral atrophy with widening of the extra-axial spaces and ventricular dilatation. There are areas of decreased attenuation within the white matter tracts of the supratentorial brain, consistent with microvascular disease changes. Vascular: No hyperdense vessel or unexpected calcification. Skull: Normal. Negative for fracture or focal lesion. Sinuses/Orbits: There is mild posterior left maxillary sinus mucosal thickening. Other: None. IMPRESSION: 1. No acute intracranial abnormality. 2. Generalized cerebral atrophy with chronic white matter small vessel ischemic changes. Electronically Signed   By: Aram Candela M.D.   On: 01/15/2022 16:50   MR BRAIN WO CONTRAST  Result Date: 01/15/2022 CLINICAL DATA:  Dizziness EXAM: MRI HEAD WITHOUT CONTRAST TECHNIQUE: Multiplanar, multiecho pulse sequences of the brain and surrounding structures were obtained without intravenous contrast. COMPARISON:  None. FINDINGS: Brain: There is a focus of abnormal diffusion restriction at the left lateral aspect of the corpus callosum splenium. Multifocal peripheral chronic microhemorrhage in the temporal lobes. There is multifocal hyperintense T2-weighted signal within the white matter. Generalized volume loss without a clear lobar predilection. The midline  structures are normal. Vascular: Major flow voids are preserved. Skull and upper cervical spine: Normal calvarium and skull base. Visualized upper cervical spine and soft tissues are normal. Sinuses/Orbits:No paranasal sinus fluid levels or advanced mucosal thickening. No mastoid or middle ear effusion. Normal orbits. IMPRESSION: 1. Diffusion abnormality at the left lateral aspect of the corpus callosum splenium. 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 Seizure, metabolic derangement and infection are potential etiologies of this type of lesion. A small acute ischemic infarct is also possibility. 2. Findings of chronic ischemic microangiopathy and generalized volume loss. Electronically Signed   By: Deatra Robinson M.D.   On: 01/15/2022 19:13   MR BRAIN W CONTRAST  Result Date: 01/16/2022 CLINICAL DATA:  Possible vasculitis. EXAM: MRI HEAD WITH CONTRAST TECHNIQUE: Multiplanar, multiecho pulse sequences of the brain and surrounding structures were obtained with intravenous contrast. CONTRAST:  5mL GADAVIST GADOBUTROL 1 MMOL/ML IV SOLN COMPARISON:  Brain MRI without contrast 01/15/2022 FINDINGS: There is no abnormal contrast enhancement at the site of the lesion of the left corpus callosum splenium or elsewhere in the brain. IMPRESSION: No enhancement of the corpus callosum lesion. Electronically Signed   By: Chrisandra Netters.D.  On: 01/16/2022 02:43   DG FL GUIDED LUMBAR PUNCTURE  Result Date: 01/16/2022 CLINICAL DATA:  cytotoxic lesions of the corpus callosum, Headache, dizziness, unsteady gait EXAM: DIAGNOSTIC LUMBAR PUNCTURE UNDER FLUOROSCOPIC GUIDANCE COMPARISON:  None FLUOROSCOPY: Radiation Exposure Index (as provided by the fluoroscopic device): 0.4 mGy PROCEDURE: Informed consent was obtained from the patient prior to the procedure, including potential complications of headache, allergy, and pain. With the patient prone, the lower back was prepped with  Betadine. 1% Lidocaine was used for local anesthesia. Lumbar puncture was performed at the L4-L5 level using a 22g gauge needle with return of clear CSF with an opening pressure of 19 cm water. 17 ml of CSF were obtained for laboratory studies. Closing pressure was 12 cm H2O. The patient tolerated the procedure well and there were no apparent complications. IMPRESSION: Technically successful lumbar puncture as described above. Read and performed by: Kaylyn Layer, PA-C Electronically Signed   By: Rudie Meyer M.D.   On: 01/16/2022 12:37      LOS: 1 day    Joycelyn Das, MD [facility] Available via Epic secure chat 7am-7pm After these hours, please refer to coverage provider listed on [email or web address] 01/16/2022, 5:01 PM

## 2022-01-16 NOTE — ED Notes (Signed)
Pt to MRI

## 2022-01-16 NOTE — TOC Initial Note (Signed)
Transition of Care (TOC) - Initial/Assessment Note  ? ? ?Patient Details  ?Name: [name] ?MRN: [id number] ?Date of Birth: 12-02-1945 ? ?Transition of Care (TOC) CM/SW Contact:    ?Pollie Friar, RN ?Phone Number: ?01/16/2022, 4:00 PM ? ?Clinical Narrative:                 ?Patient is from home with her sister who can provide supervision.  ?Pt has PCP. She denies any issues with her home medications. She drives self and has friends that can assist if needed.  ?Recommendations for home therapy. Pt currently is refusing home therapy.  ?TOC following. ? ?Expected Discharge Plan: Home/Self Care ?Barriers to Discharge: Continued Medical Work up ? ? ?Patient Goals and CMS Choice ?  ?  ?Choice offered to / list presented to : Patient ? ?Expected Discharge Plan and Services ?Expected Discharge Plan: Home/Self Care ?  ?  ?  ?Living arrangements for the past 2 months: [place] ?                ?  ?  ?  ?  ?  ?  ?  ?  ?  ?  ? ?Prior Living Arrangements/Services ?Living arrangements for the past 2 months: [place] ?Lives with:: Siblings ?Patient language and need for interpreter reviewed:: Yes ?Do you feel safe going back to the place where you live?: Yes      ?  ?Care giver support system in place?: Yes (comment) ?Current home services: DME (cane/ walker/ grab bars/ shower seat) ?Criminal Activity/Legal Involvement Pertinent to Current Situation/Hospitalization: No - Comment as needed ? ?Activities of Daily Living ?  ?  ? ?Permission Sought/Granted ?  ?  ?   ?   ?   ?   ? ?Emotional Assessment ?Appearance:: Appears stated age ?Attitude/Demeanor/Rapport: Engaged ?Affect (typically observed): Accepting ?Orientation: : Oriented to Self, Oriented to Place, Oriented to  Time, Oriented to Situation ?  ?Psych Involvement: No (comment) ? ?Admission diagnosis:  Dizziness [R42] ?Elevated blood pressure reading [R03.0] ?Bad headache [R51.9] ?Patient Active Problem List  ? Diagnosis Date Noted  ? Dizziness  01/15/2022  ? Left leg pain 10/18/2021  ? Caregiver stress 09/06/2021  ? Aortic atherosclerosis ([place]) 03/07/2021  ? Opioid dependence ([place]) 03/07/2021  ? Migraine without aura and without status migrainosus, not intractable 04/10/2020  ? Hyperlipidemia   ? AAA (abdominal aortic aneurysm) without rupture ([place]) 08/25/2016  ? Varicose veins of bilateral lower extremities with other complications 61/68/3729  ? Chronic pain syndrome 08/09/2014  ? Vitamin D deficiency 05/10/2014  ? Lumbar degenerative disc disease 01/06/2014  ? Arthralgia 02/07/2013  ? Tobacco abuse 01/31/2013  ? ?PCP:  Hoyt Koch, MD ?Pharmacy:   ?[facility] [address] ?[place] [address] ?Phone: [phone] Fax: [phone] ? ? ? ? ?Social Determinants of Health (SDOH) Interventions ?  ? ?Readmission Risk Interventions ?   ? View : No data to display.  ?  ?  ?  ? ? ? ?

## 2022-01-16 NOTE — Progress Notes (Signed)
Pending LP and labs. ?MRI with contrast with no abnormal enhancement. ?General treatment of the cytotoxic lesions of the corpus callosum is supportive care unless the LP shows any infectious cause or points to an autoimmune etiology but might need further treatment with antibiotics or steroids. ?I will follow the results with you. ? ? ?-- ?Amie Portland, MD ?Neurologist ?[facility] ?Pager: [phone] ? ?

## 2022-01-17 DIAGNOSIS — E782 Mixed hyperlipidemia: Secondary | ICD-10-CM

## 2022-01-17 DIAGNOSIS — R9089 Other abnormal findings on diagnostic imaging of central nervous system: Secondary | ICD-10-CM

## 2022-01-17 DIAGNOSIS — G43009 Migraine without aura, not intractable, without status migrainosus: Secondary | ICD-10-CM

## 2022-01-17 DIAGNOSIS — R42 Dizziness and giddiness: Secondary | ICD-10-CM | POA: Diagnosis not present

## 2022-01-17 DIAGNOSIS — E538 Deficiency of other specified B group vitamins: Secondary | ICD-10-CM

## 2022-01-17 LAB — BASIC METABOLIC PANEL
Anion gap: 6 (ref 5–15)
BUN: 10 mg/dL (ref 8–23)
CO2: 26 mmol/L (ref 22–32)
Calcium: 8.6 mg/dL — ABNORMAL LOW (ref 8.9–10.3)
Chloride: 105 mmol/L (ref 98–111)
Creatinine, Ser: 0.67 mg/dL (ref 0.44–1.00)
GFR, Estimated: >60 mL/min (ref >60)
Glucose, Bld: 101 mg/dL — ABNORMAL HIGH (ref 70–99)
Potassium: 3.2 mmol/L — ABNORMAL LOW (ref 3.5–5.1)
Sodium: 137 mmol/L (ref 135–145)

## 2022-01-17 LAB — MAGNESIUM: Magnesium: 2 mg/dL (ref 1.7–2.4)

## 2022-01-17 LAB — CBC
HCT: 37 % (ref 36.0–46.0)
Hemoglobin: 12.7 g/dL (ref 12.0–15.0)
MCH: 33.5 pg (ref 26.0–34.0)
MCHC: 34.3 g/dL (ref 30.0–36.0)
MCV: 97.6 fL (ref 80.0–100.0)
Platelets: 177 10*3/uL (ref 150–400)
RBC: 3.79 MIL/uL — ABNORMAL LOW (ref 3.87–5.11)
RDW: 14.3 % (ref 11.5–15.5)
WBC: 9.1 10*3/uL (ref 4.0–10.5)
nRBC: 0 % (ref 0.0–0.2)

## 2022-01-17 MED ORDER — CYANOCOBALAMIN 1000 MCG/ML IJ SOLN
1000.0000 ug | Freq: Once | INTRAMUSCULAR | Status: AC
  Administered 2022-01-17: 1000 ug via INTRAMUSCULAR
  Filled 2022-01-17: qty 1

## 2022-01-17 MED ORDER — VITAMIN B-12 1000 MCG PO TABS: 1000.0000 ug | ORAL_TABLET | Freq: Every day | ORAL | 2 refills | Status: AC

## 2022-01-17 MED ORDER — THIAMINE HCL 100 MG PO TABS: 100.0000 mg | ORAL_TABLET | Freq: Every day | ORAL | 0 refills | Status: DC

## 2022-01-17 MED ORDER — POTASSIUM CHLORIDE CRYS ER 20 MEQ PO TBCR
40.0000 meq | EXTENDED_RELEASE_TABLET | Freq: Once | ORAL | Status: AC
  Administered 2022-01-17: 40 meq via ORAL
  Filled 2022-01-17: qty 2

## 2022-01-17 MED ORDER — NICOTINE 7 MG/24HR TD PT24: 7.0000 mg | MEDICATED_PATCH | Freq: Every day | TRANSDERMAL | 0 refills | Status: DC | PRN

## 2022-01-17 NOTE — Progress Notes (Signed)
Physical Therapy Treatment ?Patient Details ?Name: [name] ?MRN: [id number] ?DOB: 25-Oct-1945 ?Today's Date: 01/17/2022 ? ? ?History of Present Illness [name] is a 76 y.o. female admitted 4/19 to hospital with severe headache which was sudden in onset with dizziness and unsteady gait 3 days.  She reported elevated blood pressure 3 days prior to the symptoms. MRI brain was obtained and revealed diffusion abnormality at the left lateral aspect of the corpus callosum splenium.  PMH: migraine, hyperlipidemia, tobacco use disorder ? ?  ?PT Comments  ? ? Pt demonstrates mod I bed mobility and transfers. Supervision for ambulation in room. Pt declining any further PT intervention, including follow up services. She states her gait is a little off due to 2 previous hip replacements, but has managed for 5 years and has no concerns going forward. PT signing off. ?  ?Recommendations for follow up therapy are one component of a multi-disciplinary discharge planning process, led by the attending physician.  Recommendations may be updated based on patient status, additional functional criteria and insurance authorization. ? ?Follow Up Recommendations ? No PT follow up ?  ?  ?Assistance Recommended at Discharge Set up Supervision/Assistance  ?Patient can return home with the following   ?  ?Equipment Recommendations ? None recommended by PT  ?  ?Recommendations for Other Services   ? ? ?  ?Precautions / Restrictions Precautions ?Precautions: Fall  ?  ? ?Mobility ? Bed Mobility ?Overal bed mobility: Independent ?  ?  ?  ?  ?  ?  ?  ?  ? ?Transfers ?Overall transfer level: Independent ?Equipment used: None ?  ?  ?  ?  ?  ?  ?  ?  ?  ? ?Ambulation/Gait ?Ambulation/Gait assistance: Supervision ?  ?Assistive device: None ?Gait Pattern/deviations: Step-through pattern ?  ?  ?  ?General Gait Details: supervision in room ambulation. Pt declining hallway amb. ? ? ?Stairs ?  ?  ?  ?  ?  ? ? ?Wheelchair Mobility ?  ? ?Modified  Rankin (Stroke Patients Only) ?Modified Rankin (Stroke Patients Only) ?Pre-Morbid Rankin Score: No symptoms ?Modified Rankin: Moderate disability ? ? ?  ?Balance Overall balance assessment: Needs assistance ?Sitting-balance support: No upper extremity supported, Feet supported ?Sitting balance-Leahy Scale: Good ?  ?  ?Standing balance support: No upper extremity supported, During functional activity ?Standing balance-Leahy Scale: Fair ?  ?  ?  ?  ?  ?  ?  ?  ?  ?  ?  ?  ?  ? ?  ?Cognition Arousal/Alertness: Awake/alert ?Behavior During Therapy: [facility] for tasks assessed/performed (mildly irritated) ?Overall Cognitive Status: Within Functional Limits for tasks assessed ?  ?  ?  ?  ?  ?  ?  ?  ?  ?  ?  ?  ?  ?  ?  ?  ?  ?  ?  ? ?  ?Exercises   ? ?  ?General Comments   ?  ?  ? ?Pertinent Vitals/Pain Pain Assessment ?Pain Assessment: No/denies pain  ? ? ?Home Living   ?  ?  ?  ?  ?  ?  ?  ?  ?  ?   ?  ?Prior Function    ?  ?  ?   ? ?PT Goals (current goals can now be found in the care plan section) Acute Rehab PT Goals ?Patient Stated Goal: home today ?PT Goal Formulation: All assessment and education complete, DC therapy ?Progress towards PT goals: Goals  met/education completed, patient discharged from PT ? ?  ?Frequency ? ? ? Min 4X/week ? ? ? ?  ?PT Plan Discharge plan needs to be updated;Equipment recommendations need to be updated  ? ? ?Co-evaluation   ?  ?  ?  ?  ? ?  ?AM-PAC PT "6 Clicks" Mobility   ?Outcome Measure ? Help needed turning from your back to your side while in a flat bed without using bedrails?: None ?Help needed moving from lying on your back to sitting on the side of a flat bed without using bedrails?: None ?Help needed moving to and from a bed to a chair (including a wheelchair)?: None ?Help needed standing up from a chair using your arms (e.g., wheelchair or bedside chair)?: None ?Help needed to walk in hospital room?: A Little ?Help needed climbing 3-5 steps with a railing? : A Little ?6 Click  Score: 22 ? ?  ?End of Session   ?Activity Tolerance: Other (comment) (Pt declining out of room.) ?Patient left: in bed;with call bell/phone within reach;with family/visitor present ?Nurse Communication: Mobility status ?PT Visit Diagnosis: Unsteadiness on feet (R26.81);Muscle weakness (generalized) (M62.81) ?  ? ? ?Time: 8159-4707 ?PT Time Calculation (min) (ACUTE ONLY): 10 min ? ?Charges:  $Gait Training: 8-22 mins          ?          ? ?Lorrin Goodell, PT  ?Office # [phone] ?Pager [phone] ? ? ? ?Lorriane Shire ?01/17/2022, 9:49 AM ? ?

## 2022-01-17 NOTE — TOC Transition Note (Signed)
Transition of Care (TOC) - CM/SW Discharge Note ? ? ?Patient Details  ?Name: [name] ?MRN: [id number] ?Date of Birth: Jul 12, 1946 ? ?Transition of Care (TOC) CM/SW Contact:  ?Pollie Friar, RN ?Phone Number: ?01/17/2022, 10:46 AM ? ? ?Clinical Narrative:    ?Patient discharging home with self care. Pt has refused home health services.  ?Pt has transportation home. ? ? ?Final next level of care: Home/Self Care ?Barriers to Discharge: No Barriers Identified ? ? ?Patient Goals and CMS Choice ?  ?  ?Choice offered to / list presented to : Patient ? ?Discharge Placement ?  ?           ?  ?  ?  ?  ? ?Discharge Plan and Services ?  ?  ?           ?  ?  ?  ?  ?  ?  ?  ?  ?  ?  ? ?Social Determinants of Health (SDOH) Interventions ?  ? ? ?Readmission Risk Interventions ?   ? View : No data to display.  ?  ?  ?  ? ? ? ? ? ?

## 2022-01-17 NOTE — Care Management Important Message (Signed)
Important Message ? ?Patient Details  ?Name: [name] ?MRN: [id number] ?Date of Birth: [birth year] ? ? ?Medicare Important Message Given:  Yes ? ?Patient left prior to IM delivery will mail document to the patient home address.  ? ? ? ?[name] ?01/17/2022, 3:05 PM ?

## 2022-01-17 NOTE — Progress Notes (Signed)
LP results with normal glucose, protein. Normal cell count. ?Not concerning for infectious or autoimmune process. ?CLOCCs are usually associated with toxic and metabolic derangements. ?In her case, no clear cause is apparent. ?Her B12 is 299 -I would recommend keeping the levels above 400. ?She should get parenteral B12 in the hospital and follow-up with primary care for weekly shots till the levels are sustainably over 400-500. ?No further work-up ?Plan discussed with primary hospitalist via secure chat ? ?-- ?Amie Portland, MD ?Neurologist ?[facility] ?Pager: [phone] ? ?

## 2022-01-17 NOTE — Progress Notes (Signed)
AVS given and reviewed with pt. Medications discussed. All questions answered to satisfaction. Pt verbalized understanding of information given. Pt escorted off the unit with all belongings via wheelchair by volunteer services.  

## 2022-01-17 NOTE — Discharge Summary (Signed)
?Physician Discharge Summary ?  ?Patient: [name] MRN: [id number] DOB: March 29, 1946  ?Admit date:     01/15/2022  ?Discharge date: 01/17/22  ?Discharge Physician: Corrie Mckusick Tracer Gutridge  ? ?PCP: Hoyt Koch, MD  ? ?Recommendations at discharge:  ? ?Follow-up with your primary care physician in 1 week.  ?Patient did have a corpus callosal abnormality on the MRI which could be present in vitamin B12 deficiency.  She has been prescribed vitamin B12 which will be continued on discharge.  Goal of vitamin B12 is more than 400. ?Pending labs to follow include vitamin B1 level, B6 level and oligoclonal bands on the CSF analysis. ? ?Discharge Diagnoses: ?Active Problems: ?  Hyperlipidemia ?  Migraine without aura and without status migrainosus, not intractable ?  B12 deficiency ?  Abnormal finding on MRI of brain ? ?Principal Problem (Resolved): ?  Dizziness ? ?Hospital Course: ?[name] is a 76 y.o. female with past medical history of migraine, hyperlipidemia, tobacco use disorder presented to hospital with severe headache which was sudden in onset with dizziness and unsteady gait 3 days.  She reported elevated blood pressure 3 days prior to the symptoms.  No previous history of hypertension.  She took Imitrex at home without relief.  Patient went to her PCP and was advised to get a CT scan.  Due to persistent/worsening symptoms she decided to come to the ED.  In the ED blood pressure was elevated.   MRI brain was obtained and revealed diffusion abnormality at the left lateral aspect of the corpus callosum splenium.  Neurology was consulted who recommended admission to the hospital for further evaluation and treatment.  ? ?Assessment and plan ? ?Active Problems: ?  Hyperlipidemia ?  Migraine without aura and without status migrainosus, not intractable ?  B12 deficiency ?  Abnormal finding on MRI of brain ?  ?Dizziness, severe headache, unsteady gait ?CT head scan was unrevealing. 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 IR was consulted for fluoroscopy guided lumbar puncture and underwent a lumbar puncture on 01/16/2022.  CSF culture with no organisms.  Cell count with 3 RBCs and 2 WBCs.  CSF protein at 34 and glucose at 63.  CNS infection has been ruled out at this time.  Oligoclonal bands and IgG CSF pending.  MRI of the brain with contrast was subsequently performed which showed no enhancement of the corpus callosum lesion.  She received the IV thiamine in the hospital but the vitamin B12 was noted to be low at 229.  Spoke with neurology Dr. Rory Percy.  He recommended 1 dose of IM vitamin B12 followed by oral vitamin B12 on discharge.  Vitamin B6, B1, E levels pending, vitamin B12 at 229. ? ?Severe headache with prior history of migraines ?Patient states that she takes Imitrex at home as needed.  Continue diazepam. ?  ?Elevated BP no prior history of hypertension ?Blood pressure stable at this time. ?  ?Hyperlipidemia ?Continue Lipitor on discharge. ?  ?Tobacco use disorder ?Currently on nicotine patch.  We will continue on discharge. ? ? ?Consultants:  ?Neurology ? ?Procedures performed: Fluoroscopic guided lumbar puncture on 01/16/2022 ? ?Disposition: Home.  Spoke with the patient's friend at bedside. ? ?Diet recommendation:  ?Discharge Diet Orders (From admission, onward)  ? ?  Start     Ordered  ? 01/17/22 0000  Diet general       ?  01/17/22 0945  ? ?  ?  ? ?  ? ?Regular diet ?DISCHARGE MEDICATION: ?Allergies as of 01/17/2022   ? ?   Reactions  ? Bee Venom   ? Other reaction(s): Redness  ? Cephalexin Other (See Comments)  ? Headaches  ? Gabapentin Diarrhea  ? ?  ? ?  ?Medication List  ?  ? ?TAKE these medications   ? ?atorvastatin 20 MG tablet ?Commonly known as: LIPITOR ?Take 1 tablet (20 mg total) by mouth daily. ?  ?diazepam 5 MG tablet ?Commonly known as:  VALIUM ?Take 1 tablet (5 mg total) by mouth daily as needed for anxiety. ?  ?fluocinonide cream 0.05 % ?Commonly known as: LIDEX ?Apply 1 application. topically 2 (two) times daily. ?  ?HYDROcodone-acetaminophen 10-325 MG tablet ?Commonly known as: NORCO ?Take 1 tablet by mouth 3 (three) times daily as needed for moderate pain. ?  ?ipratropium 0.06 % nasal spray ?Commonly known as: ATROVENT ?Place 1-2 sprays into both nostrils in the morning and at bedtime. ?  ?levocetirizine 5 MG tablet ?Commonly known as: XYZAL ?Take 5 mg by mouth at bedtime. ?  ?loratadine 10 MG tablet ?Commonly known as: CLARITIN ?Take 10 mg by mouth daily. ?  ?montelukast 10 MG tablet ?Commonly known as: SINGULAIR ?Take 1 tablet at bedtime. ?  ?nicotine 7 mg/24hr patch ?Commonly known as: NICODERM CQ - dosed in mg/24 hr ?Place 1 patch (7 mg total) onto the skin daily as needed (nicotine withdrawal). ?  ?ondansetron 4 MG disintegrating tablet ?Commonly known as: ZOFRAN-ODT ?Take 4 mg by mouth every 8 (eight) hours as needed for nausea or vomiting. ?  ?SUMAtriptan 50 MG tablet ?Commonly known as: IMITREX ?TAKE 1 TABLET AT ONSET OF HEADACHE, MAY REPEAT ONCE IN 2 HOURS IF PERSISTS ?What changed: See the new instructions. ?  ?thiamine 100 MG tablet ?Take 1 tablet (100 mg total) by mouth daily. ?  ?triamcinolone cream 0.1 % ?Commonly known as: KENALOG ?Apply 1 application topically 2 (two) times daily. ?  ?vitamin B-12 1000 MCG tablet ?Commonly known as: CYANOCOBALAMIN ?Take 1 tablet (1,000 mcg total) by mouth daily. ?  ? ?  ? ?  Subjective ?Today, patient was seen and examined at bedside.  States that she feels well.  Able to ambulate without any issues.  Fully oriented.  Denies any headache dizziness confusion. ? ?Discharge Exam: ?Filed Weights  ? 01/15/22 1209  ?Weight: 50.5 kg  ? ? ?  01/17/2022  ?  8:46 AM 01/17/2022  ?  2:59 AM 01/16/2022  ? 11:37 PM  ?Vitals with BMI  ?Systolic 700 174 944  ?Diastolic 86 93 81  ?Pulse 95 76 89  ? Body mass index  is 17.98 kg/m?.  ?General: Thinly built, not in obvious distress ?HENT:   No scleral pallor or icterus noted. Oral mucosa is moist.  ?Chest:  Clear breath sounds.  Diminished breath sounds bilaterally. No crackles or wheezes.  ?CVS: S1 &S2 heard. No murmur.  Regular rate and rhythm. ?Abdomen: Soft, nontender, nondistended.  Bowel sounds are heard.   ?Extremities: No cyanosis, clubbing or edema.  Peripheral pulses are palpable. ?Psych: Alert, awake and oriented, normal mood ?CNS:  No cranial nerve deficits.  Power equal in all extremities.   ?Skin: Warm and dry.  No rashes noted. ? ? ?Condition at discharge: good ? ?The results of significant diagnostics from this hospitalization (including imaging, microbiology, ancillary and laboratory) are listed below for reference.  ? ?Imaging Studies: ?CT HEAD WO CONTRAST (5MM) ? ?  Result Date: 01/15/2022 ?CLINICAL DATA:  Hypertension with headache and nausea x2 days. EXAM: CT HEAD WITHOUT CONTRAST TECHNIQUE: Contiguous axial images were obtained from the base of the skull through the vertex without intravenous contrast. RADIATION DOSE REDUCTION: This exam was performed according to the departmental dose-optimization program which includes automated exposure control, adjustment of the mA and/or kV according to patient size and/or use of iterative reconstruction technique. COMPARISON:  None. FINDINGS: Brain: There is mild cerebral atrophy with widening of the extra-axial spaces and ventricular dilatation. There are areas of decreased attenuation within the white matter tracts of the supratentorial brain, consistent with microvascular disease changes. Vascular: No hyperdense vessel or unexpected calcification. Skull: Normal. Negative for fracture or focal lesion. Sinuses/Orbits: There is mild posterior left maxillary sinus mucosal thickening. Other: None. IMPRESSION: 1. No acute intracranial abnormality. 2. Generalized cerebral atrophy with chronic white matter small vessel  ischemic changes. Electronically Signed   By: Virgina Norfolk M.D.   On: 01/15/2022 16:50  ? ?MR BRAIN WO CONTRAST ? ?Result Date: 01/15/2022 ?CLINICAL DATA:  Dizziness EXAM: MRI HEAD WITHOUT CONTRAST TECHNIQUE: Mu

## 2022-01-17 NOTE — Plan of Care (Signed)
  Problem: Activity: Goal: Risk for activity intolerance will decrease Outcome: Progressing   Problem: Nutrition: Goal: Adequate nutrition will be maintained Outcome: Progressing   Problem: Pain Managment: Goal: General experience of comfort will improve Outcome: Progressing   Problem: Safety: Goal: Ability to remain free from injury will improve Outcome: Progressing   Problem: Skin Integrity: Goal: Risk for impaired skin integrity will decrease Outcome: Progressing   

## 2022-01-18 LAB — VITAMIN E
Vitamin E (Alpha Tocopherol): 7.7 mg/L — ABNORMAL LOW (ref 9.0–29.0)
Vitamin E(Gamma Tocopherol): 0.5 mg/L (ref 0.5–4.9)

## 2022-01-19 LAB — VITAMIN B6: Vitamin B6: 4.1 ug/L (ref 3.4–65.2)

## 2022-01-19 LAB — CSF CULTURE W GRAM STAIN: Culture: NO GROWTH

## 2022-01-20 ENCOUNTER — Telehealth: Payer: Self-pay

## 2022-01-20 LAB — IGG CSF INDEX
Albumin CSF-mCnc: 20 mg/dL (ref 10–46)
Albumin: 4.4 g/dL (ref 3.7–4.7)
CSF IgG Index: 0.5 (ref 0.0–0.7)
IgG (Immunoglobin G), Serum: 779 mg/dL (ref 586–1602)
IgG, CSF: 1.6 mg/dL (ref 0.0–6.7)
IgG/Alb Ratio, CSF: 0.08 (ref 0.00–0.25)

## 2022-01-20 LAB — VITAMIN B1: Vitamin B1 (Thiamine): 134.9 nmol/L (ref 66.5–200.0)

## 2022-01-20 LAB — METHYLMALONIC ACID, SERUM: Methylmalonic Acid, Quantitative: 187 nmol/L (ref 0–378)

## 2022-01-20 NOTE — Telephone Encounter (Addendum)
Transition Care Management Unsuccessful Follow-up Telephone Call ? ?Date of discharge and from where:  [place] 01-17-22 Dx: dizziness ? ?Attempts:  1st Attempt ? ?Reason for unsuccessful TCM follow-up call:  Left voice message ? ?Transition Care Management Follow-up Telephone Call ?Date of discharge and from where: [place] 01-17-22 Dx: dizziness ?How have you been since you were released from the hospital? Doing a lot better  ?Any questions or concerns? Yes- BP was high in the hospital and pt is concerned that she was not sent home on any medication- when she left the hospital diastolic was still 876- as of now her BP is 148/90 ? ?Items Reviewed: ?Did the pt receive and understand the discharge instructions provided? Yes  ?Medications obtained and verified? Yes  ?Other? No  ?Any new allergies since your discharge? No  ?Dietary orders reviewed? Yes ?Do you have support at home? Yes  ? ?Home Care and Equipment/Supplies: ?Were home health services ordered? Yes PT ?If so, what is the name of the agency? Na-   ?Has the agency set up a time to come to the patient's home? No pt refused  ?Were any new equipment or medical supplies ordered?  No ?What is the name of the medical supply agency? na ?Were you able to get the supplies/equipment? not applicable ?Do you have any questions related to the use of the equipment or supplies? No ? ?Functional Questionnaire: (I = Independent and D = Dependent) ?ADLs: I ? ?Bathing/Dressing- I ? ?Meal Prep- I ? ?Eating- I ? ?Maintaining continence- I ? ?Transferring/Ambulation- I ? ?Managing Meds- I ? ?Follow up appointments reviewed: ? ?PCP Hospital f/u appt confirmed? Yes  Scheduled to see Dr Sharlet Salina on 01-29-22 @ 11am. ?[facility] f/u appt confirmed? No  . ?Are transportation arrangements needed? No  ?If their condition worsens, is the pt aware to call PCP or go to the Emergency Dept.? Yes ?Was the patient provided with contact information for the PCP's office or  ED? Yes ?Was to pt encouraged to call back with questions or concerns? Yes  ? ?  ?

## 2022-01-20 NOTE — Telephone Encounter (Signed)
[name] called back and states they received one FL2, but it did not have requested level of care filled out, and was told it would be resent. However, states they have not received anything.   ? ?Callback #[id number] [phone]  ?Fax #- [phone]  ?

## 2022-01-21 LAB — OLIGOCLONAL BANDS, CSF + SERM

## 2022-01-21 NOTE — Telephone Encounter (Signed)
[name] from social services has called again and claims she still has not received the updated FL2 form. She specifically needs sections 15 & 16 marked to make sure pt get special assistance. This a time sensitive matter and needs top be completed asap so the pt's application doesn't expire.  ? ?[name]  ?Phone: [phone] Ext. [phone] ?Fax: [phone] ?

## 2022-01-21 NOTE — Telephone Encounter (Signed)
Patient was in the hospital and missed visit to assess I had directed she would need follow up visit to fill out this form for appropriateness.  ?

## 2022-01-29 ENCOUNTER — Encounter: Payer: Self-pay | Admitting: Internal Medicine

## 2022-01-29 ENCOUNTER — Ambulatory Visit (INDEPENDENT_AMBULATORY_CARE_PROVIDER_SITE_OTHER): Payer: Medicare Other | Admitting: Internal Medicine

## 2022-01-29 DIAGNOSIS — E876 Hypokalemia: Secondary | ICD-10-CM

## 2022-01-29 DIAGNOSIS — E538 Deficiency of other specified B group vitamins: Secondary | ICD-10-CM | POA: Diagnosis not present

## 2022-01-29 NOTE — Patient Instructions (Signed)
You do not need any labs today and we will check them in June.  ?

## 2022-01-29 NOTE — Progress Notes (Signed)
? ?  Subjective:  ? ?Patient ID: [name], female    DOB: 10-Jun-1946, 76 y.o.   MRN: [id number] ? ?HPI ?The patient is a 76 YO female coming in for hospital follow up.  ? ?Review of Systems  ?Constitutional: Negative.   ?HENT: Negative.    ?Eyes: Negative.   ?Respiratory:  Negative for cough, chest tightness and shortness of breath.   ?Cardiovascular:  Negative for chest pain, palpitations and leg swelling.  ?Gastrointestinal:  Negative for abdominal distention, abdominal pain, constipation, diarrhea, nausea and vomiting.  ?Musculoskeletal:  Positive for arthralgias.  ?Skin: Negative.   ?Neurological: Negative.   ?Psychiatric/Behavioral: Negative.    ? ?Objective:  ?Physical Exam ?Constitutional:   ?   Appearance: She is well-developed.  ?HENT:  ?   Head: Normocephalic and atraumatic.  ?Cardiovascular:  ?   Rate and Rhythm: Normal rate and regular rhythm.  ?Pulmonary:  ?   Effort: Pulmonary effort is normal. No respiratory distress.  ?   Breath sounds: Normal breath sounds. No wheezing or rales.  ?Abdominal:  ?   General: Bowel sounds are normal. There is no distension.  ?   Palpations: Abdomen is soft.  ?   Tenderness: There is no abdominal tenderness. There is no rebound.  ?Musculoskeletal:     ?   General: Tenderness present.  ?   Cervical back: Normal range of motion.  ?Skin: ?   General: Skin is warm and dry.  ?Neurological:  ?   Mental Status: She is alert and oriented to person, place, and time.  ?   Coordination: Coordination normal.  ? ? ?Vitals:  ? 01/29/22 1053  ?BP: 116/68  ?Pulse: (!) 59  ?Resp: 18  ?SpO2: 99%  ?Weight: 110 lb (49.9 kg)  ?Height: '5\' 6"'$  (1.676 m)  ? ? ?This visit occurred during the SARS-CoV-2 public health emergency.  Safety protocols were in place, including screening questions prior to the visit, additional usage of staff PPE, and extensive cleaning of exam room while observing appropriate contact time as indicated for disinfecting solutions.  ? ?Assessment & Plan:  ? ?

## 2022-01-30 ENCOUNTER — Telehealth: Payer: Self-pay | Admitting: Internal Medicine

## 2022-01-30 NOTE — Telephone Encounter (Signed)
[name] with [employer] called to check on an FL2 that was supposed to be faxed on 5/3...states they have not received the form and if they do not have it by tomorrow patient's application will be denied. ?

## 2022-01-31 DIAGNOSIS — E876 Hypokalemia: Secondary | ICD-10-CM | POA: Insufficient documentation

## 2022-01-31 NOTE — Telephone Encounter (Signed)
[facility] called back and said that they really need this faxed today or patient will be ineligible for their services. ?

## 2022-01-31 NOTE — Telephone Encounter (Signed)
[name] calls back regarding FL2. [name] states that FL2 needs to state domiciliary for the form to be accepted. She stated that if original form had it marked that she could accept it with the addition of the providers initials, or she would need a whole new form with the domiciliary status checked.   ?

## 2022-01-31 NOTE — Telephone Encounter (Signed)
Updated FL2 has been faxed to [facility].  ?

## 2022-01-31 NOTE — Assessment & Plan Note (Signed)
Given a dose of IM B12 in the hospital which can be given monthly if desires. She wishes to take oral B12 and we will recheck the level when she returns in June to see if this is adequate.  ?

## 2022-01-31 NOTE — Assessment & Plan Note (Signed)
Potassium was low when leaving hospital and offered repeat today which she declines would rather wait until physical in June for labs. Feels well and denies weakness, muscle pain or cramps or tingling.  ?

## 2022-01-31 NOTE — Telephone Encounter (Signed)
Pt called in to express importance of form being filled out- because it will expire.  ? ?Informed pt that [employer] just called and that form will be completed ASAP.  ?

## 2022-02-04 DIAGNOSIS — M47817 Spondylosis without myelopathy or radiculopathy, lumbosacral region: Secondary | ICD-10-CM | POA: Diagnosis not present

## 2022-02-27 ENCOUNTER — Ambulatory Visit (HOSPITAL_COMMUNITY): Payer: Medicare Other | Attending: Cardiology

## 2022-02-27 DIAGNOSIS — R011 Cardiac murmur, unspecified: Secondary | ICD-10-CM | POA: Diagnosis not present

## 2022-02-27 LAB — ECHOCARDIOGRAM COMPLETE
AR max vel: 1.55 cm2
AV Area VTI: 1.63 cm2
AV Area mean vel: 1.51 cm2
AV Mean grad: 10 mmHg
AV Peak grad: 17.4 mmHg
Ao pk vel: 2.09 m/s
Area-P 1/2: 3.72 cm2
S' Lateral: 2.6 cm

## 2022-02-28 ENCOUNTER — Encounter: Payer: Self-pay | Admitting: Internal Medicine

## 2022-02-28 DIAGNOSIS — I340 Nonrheumatic mitral (valve) insufficiency: Secondary | ICD-10-CM | POA: Insufficient documentation

## 2022-03-06 ENCOUNTER — Other Ambulatory Visit: Payer: Self-pay | Admitting: Family Medicine

## 2022-03-07 ENCOUNTER — Encounter: Payer: Self-pay | Admitting: Internal Medicine

## 2022-03-07 ENCOUNTER — Ambulatory Visit (INDEPENDENT_AMBULATORY_CARE_PROVIDER_SITE_OTHER): Payer: Medicare Other | Admitting: Internal Medicine

## 2022-03-07 ENCOUNTER — Other Ambulatory Visit: Payer: Self-pay | Admitting: Internal Medicine

## 2022-03-07 DIAGNOSIS — I340 Nonrheumatic mitral (valve) insufficiency: Secondary | ICD-10-CM

## 2022-03-07 DIAGNOSIS — E538 Deficiency of other specified B group vitamins: Secondary | ICD-10-CM | POA: Diagnosis not present

## 2022-03-07 DIAGNOSIS — Z720 Tobacco use: Secondary | ICD-10-CM | POA: Diagnosis not present

## 2022-03-07 NOTE — Assessment & Plan Note (Signed)
Counseled on need to take B12 either separate or multivitamin.

## 2022-03-07 NOTE — Assessment & Plan Note (Signed)
She is not able to cut back currently. Counseled about risk/harm from smoking.

## 2022-03-07 NOTE — Patient Instructions (Signed)
It is okay to take the multivitamin

## 2022-03-07 NOTE — Progress Notes (Signed)
   Subjective:   Patient ID: [name], female    DOB: 05-03-1946, 76 y.o.   MRN: [id number]  HPI The patient is a 76 YO female coming in for follow up.   Review of Systems  Constitutional: Negative.   HENT: Negative.    Eyes: Negative.   Respiratory:  Negative for cough, chest tightness and shortness of breath.   Cardiovascular:  Negative for chest pain, palpitations and leg swelling.  Gastrointestinal:  Negative for abdominal distention, abdominal pain, constipation, diarrhea, nausea and vomiting.  Musculoskeletal: Negative.   Skin: Negative.   Neurological: Negative.   Psychiatric/Behavioral: Negative.      Objective:  Physical Exam Constitutional:      Appearance: She is well-developed.     Comments: thin  HENT:     Head: Normocephalic and atraumatic.  Cardiovascular:     Rate and Rhythm: Normal rate and regular rhythm.  Pulmonary:     Effort: Pulmonary effort is normal. No respiratory distress.     Breath sounds: Normal breath sounds. No wheezing or rales.  Abdominal:     General: Bowel sounds are normal. There is no distension.     Palpations: Abdomen is soft.     Tenderness: There is no abdominal tenderness. There is no rebound.  Musculoskeletal:     Cervical back: Normal range of motion.  Skin:    General: Skin is warm and dry.  Neurological:     Mental Status: She is alert and oriented to person, place, and time.     Coordination: Coordination normal.     Vitals:   03/07/22 1351  BP: 124/76  Pulse: (!) 106  Resp: 18  SpO2: 95%  Weight: 111 lb 6.4 oz (50.5 kg)  Height: '5\' 6"'$  (1.676 m)    Assessment & Plan:  Visit time 15 minutes in face to face communication with patient and coordination of care, additional 5 minutes spent in record review, coordination or care, ordering tests, communicating/referring to other healthcare professionals, documenting in medical records all on the same day of the visit for total time 20 minutes spent on the visit.

## 2022-03-07 NOTE — Assessment & Plan Note (Signed)
Discussed significance with her and plan for repeat echo in 2-3 years or sooner for new/change in SOB type symptoms.

## 2022-03-17 DIAGNOSIS — M47817 Spondylosis without myelopathy or radiculopathy, lumbosacral region: Secondary | ICD-10-CM | POA: Diagnosis not present

## 2022-03-17 DIAGNOSIS — Z681 Body mass index (BMI) 19 or less, adult: Secondary | ICD-10-CM | POA: Diagnosis not present

## 2022-03-19 ENCOUNTER — Ambulatory Visit: Payer: Medicare Other

## 2022-03-24 ENCOUNTER — Ambulatory Visit: Payer: Medicare Other

## 2022-04-04 ENCOUNTER — Ambulatory Visit: Payer: Medicare Other

## 2022-04-14 ENCOUNTER — Other Ambulatory Visit: Payer: Self-pay | Admitting: Internal Medicine

## 2022-04-14 DIAGNOSIS — G43009 Migraine without aura, not intractable, without status migrainosus: Secondary | ICD-10-CM

## 2022-04-21 ENCOUNTER — Other Ambulatory Visit: Payer: Self-pay | Admitting: Internal Medicine

## 2022-05-13 DIAGNOSIS — M47817 Spondylosis without myelopathy or radiculopathy, lumbosacral region: Secondary | ICD-10-CM | POA: Diagnosis not present

## 2022-05-16 ENCOUNTER — Ambulatory Visit (INDEPENDENT_AMBULATORY_CARE_PROVIDER_SITE_OTHER): Payer: Medicare Other | Admitting: Internal Medicine

## 2022-05-16 ENCOUNTER — Encounter: Payer: Self-pay | Admitting: Internal Medicine

## 2022-05-16 VITALS — BP 142/72 | HR 106 | Temp 98.6°F | Ht 66.0 in | Wt 111.0 lb

## 2022-05-16 DIAGNOSIS — G43009 Migraine without aura, not intractable, without status migrainosus: Secondary | ICD-10-CM

## 2022-05-16 MED ORDER — METHYLPREDNISOLONE ACETATE 40 MG/ML IJ SUSP
40.0000 mg | Freq: Once | INTRAMUSCULAR | Status: AC
  Administered 2022-05-16: 40 mg via INTRAMUSCULAR

## 2022-05-16 NOTE — Assessment & Plan Note (Signed)
Suspect headache cluster. Given depo-medrol 40 mg IM today. She has tried imitrex for pain relief with partial relief. BP elevated today likely due to pain as never previously elevated. Will monitor BP closely.

## 2022-05-16 NOTE — Patient Instructions (Signed)
We have given you the steroid shot today to help break up the headache. Let us know Monday how you are doing.

## 2022-05-16 NOTE — Progress Notes (Signed)
   Subjective:   Patient ID: [name], female    DOB: 1946-06-08, 76 y.o.   MRN: [id number]  HPI The patient is a 76 YO female coming in for headache last few weeks. Overall feels well otherwise.  Review of Systems  Constitutional: Negative.   HENT: Negative.    Eyes: Negative.   Respiratory:  Negative for cough, chest tightness and shortness of breath.   Cardiovascular:  Negative for chest pain, palpitations and leg swelling.  Gastrointestinal:  Negative for abdominal distention, abdominal pain, constipation, diarrhea, nausea and vomiting.  Musculoskeletal: Negative.   Skin: Negative.   Neurological:  Positive for headaches.  Psychiatric/Behavioral: Negative.      Objective:  Physical Exam Constitutional:      Appearance: She is well-developed.  HENT:     Head: Normocephalic and atraumatic.  Cardiovascular:     Rate and Rhythm: Normal rate and regular rhythm.  Pulmonary:     Effort: Pulmonary effort is normal. No respiratory distress.     Breath sounds: Normal breath sounds. No wheezing or rales.  Abdominal:     General: Bowel sounds are normal. There is no distension.     Palpations: Abdomen is soft.     Tenderness: There is no abdominal tenderness. There is no rebound.  Musculoskeletal:     Cervical back: Normal range of motion.  Skin:    General: Skin is warm and dry.  Neurological:     Mental Status: She is alert and oriented to person, place, and time.     Coordination: Coordination normal.     Vitals:   05/16/22 1550 05/16/22 1558 05/16/22 1609  BP: (!) 150/100 (!) 150/100 (!) 142/72  Pulse: (!) 106    Temp: 98.6 F (37 C)    TempSrc: Oral    SpO2: 93%    Weight: 111 lb (50.3 kg)    Height: '5\' 6"'$  (1.676 m)      Assessment & Plan:  Depo-medrol 40 mg IM given at visit

## 2022-05-19 ENCOUNTER — Telehealth: Payer: Self-pay | Admitting: Internal Medicine

## 2022-05-19 NOTE — Telephone Encounter (Signed)
Is headache intensity improving/worsening/same and frequency of headaches more/less/same?

## 2022-05-19 NOTE — Telephone Encounter (Signed)
Pt was seen by Dr Sharlet Salina on 05/16/22. She was advised to call and let us know how the steroid shot worked. Pt stated the steroid shot did not work.

## 2022-05-21 ENCOUNTER — Ambulatory Visit: Payer: Medicare Other | Admitting: Internal Medicine

## 2022-05-21 NOTE — Telephone Encounter (Signed)
Pt states the headache has not improved at all. The headache is still bat the back of her head.  Pt made an appointment to be seen tomorrow 05/22/2022 at 10:40 and is requesting an EKG be done at this apptmnt.

## 2022-05-22 ENCOUNTER — Telehealth: Payer: Self-pay | Admitting: Internal Medicine

## 2022-05-22 ENCOUNTER — Ambulatory Visit (INDEPENDENT_AMBULATORY_CARE_PROVIDER_SITE_OTHER): Payer: Medicare Other | Admitting: Internal Medicine

## 2022-05-22 ENCOUNTER — Encounter: Payer: Self-pay | Admitting: Internal Medicine

## 2022-05-22 VITALS — BP 136/88 | HR 83 | Ht 66.0 in | Wt 110.6 lb

## 2022-05-22 DIAGNOSIS — I700 Atherosclerosis of aorta: Secondary | ICD-10-CM

## 2022-05-22 DIAGNOSIS — G43009 Migraine without aura, not intractable, without status migrainosus: Secondary | ICD-10-CM

## 2022-05-22 DIAGNOSIS — R9431 Abnormal electrocardiogram [ECG] [EKG]: Secondary | ICD-10-CM

## 2022-05-22 DIAGNOSIS — E876 Hypokalemia: Secondary | ICD-10-CM | POA: Diagnosis not present

## 2022-05-22 DIAGNOSIS — E538 Deficiency of other specified B group vitamins: Secondary | ICD-10-CM

## 2022-05-22 LAB — VITAMIN D 25 HYDROXY (VIT D DEFICIENCY, FRACTURES): VITD: 44.26 ng/mL (ref 30.00–100.00)

## 2022-05-22 LAB — COMPREHENSIVE METABOLIC PANEL
ALT: 17 U/L (ref 0–35)
AST: 21 U/L (ref 0–37)
Albumin: 4.6 g/dL (ref 3.5–5.2)
Alkaline Phosphatase: 65 U/L (ref 39–117)
BUN: 8 mg/dL (ref 6–23)
CO2: 30 mEq/L (ref 19–32)
Calcium: 9.4 mg/dL (ref 8.4–10.5)
Chloride: 95 mEq/L — ABNORMAL LOW (ref 96–112)
Creatinine, Ser: 0.7 mg/dL (ref 0.40–1.20)
GFR: 83.17 mL/min (ref >60.00)
Glucose, Bld: 90 mg/dL (ref 70–99)
Potassium: 3.6 mEq/L (ref 3.5–5.1)
Sodium: 134 mEq/L — ABNORMAL LOW (ref 135–145)
Total Bilirubin: 0.7 mg/dL (ref 0.2–1.2)
Total Protein: 7.3 g/dL (ref 6.0–8.3)

## 2022-05-22 LAB — T4, FREE: Free T4: 1.07 ng/dL (ref 0.60–1.60)

## 2022-05-22 LAB — CBC
HCT: 43.9 % (ref 36.0–46.0)
Hemoglobin: 14.9 g/dL (ref 12.0–15.0)
MCHC: 33.9 g/dL (ref 30.0–36.0)
MCV: 100.2 fl — ABNORMAL HIGH (ref 78.0–100.0)
Platelets: 234 10*3/uL (ref 150.0–400.0)
RBC: 4.39 Mil/uL (ref 3.87–5.11)
RDW: 14.5 % (ref 11.5–15.5)
WBC: 8.5 10*3/uL (ref 4.0–10.5)

## 2022-05-22 LAB — VITAMIN B12: Vitamin B-12: 337 pg/mL (ref 211–911)

## 2022-05-22 LAB — TSH: TSH: 0.56 u[IU]/mL (ref 0.35–5.50)

## 2022-05-22 MED ORDER — CYCLOBENZAPRINE HCL 5 MG PO TABS: 5.0000 mg | ORAL_TABLET | Freq: Three times a day (TID) | ORAL | 1 refills | Status: DC | PRN

## 2022-05-22 NOTE — Telephone Encounter (Signed)
Patient called and states that Dr. Sharlet Salina changed her anti-nausea medication from Zofran to Phenergan and the pharmacy needs to speak with someone from the office regarding the change.  [name] [phone]

## 2022-05-22 NOTE — Progress Notes (Signed)
   Subjective:   Patient ID: [name], female    DOB: 01-14-1946, 76 y.o.   MRN: [id number]  HPI The patient is a 76 YO female coming in for ongoing headache and feeling poorly.   Review of Systems  Constitutional: Negative.   HENT: Negative.    Eyes: Negative.   Respiratory:  Negative for cough, chest tightness and shortness of breath.   Cardiovascular:  Negative for chest pain, palpitations and leg swelling.  Gastrointestinal:  Negative for abdominal distention, abdominal pain, constipation, diarrhea, nausea and vomiting.  Musculoskeletal: Negative.   Skin: Negative.   Neurological:  Positive for headaches.  Psychiatric/Behavioral: Negative.      Objective:  Physical Exam Constitutional:      Appearance: She is well-developed.  HENT:     Head: Normocephalic and atraumatic.  Cardiovascular:     Rate and Rhythm: Normal rate and regular rhythm.  Pulmonary:     Effort: Pulmonary effort is normal. No respiratory distress.     Breath sounds: Normal breath sounds. No wheezing or rales.  Abdominal:     General: Bowel sounds are normal. There is no distension.     Palpations: Abdomen is soft.     Tenderness: There is no abdominal tenderness. There is no rebound.  Musculoskeletal:     Cervical back: Normal range of motion.  Skin:    General: Skin is warm and dry.  Neurological:     Mental Status: She is alert and oriented to person, place, and time.     Coordination: Coordination normal.     Vitals:   05/22/22 1102  BP: 136/88  Pulse: 83  SpO2: 94%  Weight: 110 lb 9.6 oz (50.2 kg)  Height: '5\' 6"'$  (1.676 m)   EKG: Rate 83, axis normal, interval long Qtc @ 502, sinus, stable st or t wave changes, biatrial enlargement, no significant change compared to prior 12/2021 except QTc prolongation new   Assessment & Plan:

## 2022-05-22 NOTE — Patient Instructions (Addendum)
We will check the labs today. The EKG is normal except the long QT interval so we are checking the labs.  We have sent in cyclobenzaprine to use for headache.  I have sent in phenergan to help the nausea instead of the zofran due to the QT interval.

## 2022-05-23 ENCOUNTER — Other Ambulatory Visit: Payer: Self-pay

## 2022-05-23 DIAGNOSIS — R9431 Abnormal electrocardiogram [ECG] [EKG]: Secondary | ICD-10-CM | POA: Insufficient documentation

## 2022-05-23 MED ORDER — PROMETHAZINE HCL 12.5 MG PO TABS: 12.5000 mg | ORAL_TABLET | Freq: Three times a day (TID) | ORAL | 0 refills | Status: DC | PRN

## 2022-05-23 NOTE — Assessment & Plan Note (Signed)
We did stop zofran and substitute lower dose promethazine due to new mild QTc prolongation. Suspect this is due to using zofran more lately for nausea.

## 2022-05-23 NOTE — Assessment & Plan Note (Signed)
Checking CMP with new QTc prolongation.

## 2022-05-23 NOTE — Assessment & Plan Note (Signed)
Checking B12 level today.

## 2022-05-23 NOTE — Telephone Encounter (Signed)
Patient called and states that the pharmacy filled Zofran instead of phenergan. She would like the Phenergan called in to [place], [place] as soon as possible.

## 2022-05-23 NOTE — Telephone Encounter (Signed)
Pt aware.

## 2022-05-23 NOTE — Assessment & Plan Note (Signed)
Unclear etiology although could be analgesic rebound. She is on chronic hydrocodone/apap TID. She had steroid IM about 1 week ago and has not had improvement. Rx flexeril for pain to try. We tried sumatriptan which did not help at all. Her chronic hydrocodone/apap does not help.

## 2022-05-23 NOTE — Telephone Encounter (Signed)
Have resent thanks

## 2022-05-23 NOTE — Assessment & Plan Note (Addendum)
EKG done for monitoring and feeling poorly lately which is stable except mild QTc prolongation. Checking CBC, CMP, thyroid to assess for causes. Only new medication is zofran which can contribute which was stopped today.

## 2022-06-23 ENCOUNTER — Ambulatory Visit (INDEPENDENT_AMBULATORY_CARE_PROVIDER_SITE_OTHER): Payer: Medicare Other

## 2022-06-23 ENCOUNTER — Other Ambulatory Visit: Payer: Self-pay | Admitting: Internal Medicine

## 2022-06-23 VITALS — Ht 66.0 in | Wt 108.0 lb

## 2022-06-23 DIAGNOSIS — Z0000 Encounter for general adult medical examination without abnormal findings: Secondary | ICD-10-CM

## 2022-06-23 NOTE — Progress Notes (Signed)
Subjective:   [name] is a 76 y.o. female who presents for Medicare Annual (Subsequent) preventive examination.   Virtual Visit via Telephone Note  I connected with  [name] on 06/23/22 at 11:15 AM EDT by telephone and verified that I am speaking with the correct person using two identifiers.  Location: Patient: home  Provider: [facility]  Persons participating in the virtual visit: patient/Nurse Health Advisor   I discussed the limitations, risks, security and privacy concerns of performing an evaluation and management service by telephone and the availability of in person appointments. The patient expressed understanding and agreed to proceed.  Interactive audio and video telecommunications were attempted between this nurse and patient, however failed, due to patient having technical difficulties OR patient did not have access to video capability.  We continued and completed visit with audio only.  Some vital signs may be absent or patient reported.   [name], LPN  Review of Systems     Cardiac Risk Factors include: advanced age (>81mn, >>79women)     Objective:    Today's Vitals   06/23/22 1124  Weight: 108 lb (49 kg)  Height: '5\' 6"'$  (1.676 m)   Body mass index is 17.43 kg/m.     06/23/2022   11:28 AM 01/15/2022   12:12 PM 06/19/2021    5:25 PM 03/13/2020    8:23 AM 08/15/2019    7:47 AM 08/09/2019   11:54 AM 12/23/2017    1:18 PM  Advanced Directives  Does Patient Have a Medical Advance Directive? Yes Yes Yes Yes Yes Yes Yes  Type of AParamedicof AMoyockLiving will [place]Living will HAssumptionLiving will;Out of facility DNR (pink MOST or yellow form) HGreenwoodLiving will HMilledgevilleLiving will HShokanLiving will   Does patient want to make changes to medical advance directive?    No - Patient declined No - Patient  declined No - Patient declined   Copy of [place]ake Worthin Chart? No - copy requested  No - copy requested  No - copy requested No - copy requested     Current Medications (verified) Outpatient Encounter Medications as of 06/23/2022  Medication Sig   atorvastatin (LIPITOR) 20 MG tablet Take 1 tablet (20 mg total) by mouth daily.   cyclobenzaprine (FLEXERIL) 5 MG tablet Take 1 tablet (5 mg total) by mouth 3 (three) times daily as needed for muscle spasms.   diazepam (VALIUM) 5 MG tablet TAKE ONE TABLET DAILY AS NEEDED FOR ANXIETY   HYDROcodone-acetaminophen (NORCO) 10-325 MG tablet Take 1 tablet by mouth 3 (three) times daily as needed for moderate pain.   ipratropium (ATROVENT) 0.06 % nasal spray Place 1-2 sprays into both nostrils in the morning and at bedtime.   levocetirizine (XYZAL) 5 MG tablet Take 5 mg by mouth at bedtime.   montelukast (SINGULAIR) 10 MG tablet Take 1 tablet at bedtime.   Multiple Vitamin (MULTIVITAMIN) tablet Take 1 tablet by mouth daily.   promethazine (PHENERGAN) 12.5 MG tablet Take 1 tablet (12.5 mg total) by mouth every 8 (eight) hours as needed for nausea or vomiting.   vitamin B-12 (CYANOCOBALAMIN) 1000 MCG tablet Take 1 tablet (1,000 mcg total) by mouth daily.   No facility-administered encounter medications on file as of 06/23/2022.    Allergies (verified) Bee venom, Cephalexin, and Gabapentin   History: Past Medical History:  Diagnosis Date   AAA (abdominal  aortic aneurysm) [facility])    saw Dr Kellie Simmering 11/2013.  2.7 cm   Arthritis    Chicken pox    COPD (chronic obstructive pulmonary disease) (HCC)    smokes 1/2ppd   DDD (degenerative disc disease), lumbar    GERD (gastroesophageal reflux disease)    diet controlled   History of blood transfusion    History of lumbar fusion 08/09/2014   Hyperlipidemia    IBS (irritable bowel syndrome)    diet contolled   Mild mitral regurgitation    RBBB    S/P arthroscopy of right shoulder 12/15/2017    Seasonal allergies    Varicose veins    Past Surgical History:  Procedure Laterality Date   APPENDECTOMY     BACK SURGERY N/A [phone]   5 back surgeries   BREAST BIOPSY  1989,05   lt br bx   BREAST EXCISIONAL BIOPSY Bilateral    CATARACT EXTRACTION W/ INTRAOCULAR LENS IMPLANT Bilateral 2018   CHOLECYSTECTOMY     EXCISION/RELEASE BURSA HIP Right 05/12/2013   Procedure: EXCISION RIGHT TROCHANTERIC BURSA/CALCIFICATION ;  Surgeon: Ninetta Lights, MD;  Location: [facility];  Service: Orthopedics;  Laterality: Right;   FASCIOTOMY Right 05/12/2013   Procedure: RIGHT FASCIOTOMY HIP ANY TYPE;  Surgeon: Ninetta Lights, MD;  Location: [facility];  Service: Orthopedics;  Laterality: Right;   LARYNGOSCOPY AND BRONCHOSCOPY N/A 08/15/2019   Procedure: BRONCHOSCOPY/LARYNGOSCOPY;  Surgeon: Leta Baptist, MD;  Location: [facility];  Service: ENT;  Laterality: N/A;   NM [place]  04/13/2003   negative   SHOULDER ARTHROSCOPY WITH OPEN ROTATOR CUFF REPAIR Right 2018   skin cancer removed right leg     TONSILLECTOMY AND [place] ARTHROPLASTY Left 2006   lt total hip   TOTAL HIP ARTHROPLASTY Right 2011   TOTAL HIP ARTHROPLASTY Right 2016   second surgery due to recall   US ECHOCARDIOGRAPHY  04/13/2003   EF 65%, mild TR w/suggestion of mild pulmonary hypertension,thickened anterior leaflet,central mild MR.   VAGINAL HYSTERECTOMY  1993   Family History  Problem Relation Age of Onset   Early death Mother        aortic aneurysm   Vasculitis Mother        temporal arteritis   Aortic aneurysm Mother    Heart disease Mother        before age 8   Hyperlipidemia Mother    Hypertension Mother    COPD Sister    Hypertension Sister    Depression Sister    Heart disease Sister        before age 87   Post-traumatic stress disorder Sister    Bipolar disorder Sister    Brain cancer Maternal Aunt 84       brain   Heart  disease Maternal Grandmother        aortic aneurysm   Aortic aneurysm Maternal Grandmother    Hypertension Sister    Throat cancer Maternal Grandfather    Breast cancer Neg Hx    Social History   Socioeconomic History   Marital status: Legally Separated    Spouse name: Not on file   Number of children: Not on file   Years of education: Not on file   Highest education level: Not on file  Occupational History   Not on file  Tobacco Use   Smoking status: Every Day    Packs/day: 0.50    Years:  30.00    Total pack years: 15.00    Types: Cigarettes   Smokeless tobacco: Never  Vaping Use   Vaping Use: Never used  Substance and Sexual Activity   Alcohol use: Not Currently    Alcohol/week: 0.0 standard drinks of alcohol    Comment: very occasional, not monthly   Drug use: No   Sexual activity: Not Currently  Other Topics Concern   Not on file  Social History Narrative   Not on file   Social Determinants of Health   Financial Resource Strain: Low Risk  (06/23/2022)   Overall Financial Resource Strain (CARDIA)    Difficulty of Paying Living Expenses: Not hard at all  Food Insecurity: No Food Insecurity (06/23/2022)   Hunger Vital Sign    Worried About Running Out of Food in the Last Year: Never true    Ran Out of Food in the Last Year: Never true  Transportation Needs: No Transportation Needs (06/23/2022)   PRAPARE - Hydrologist (Medical): No    Lack of Transportation (Non-Medical): No  Physical Activity: Insufficiently Active (06/23/2022)   Exercise Vital Sign    Days of Exercise per Week: 3 days    Minutes of Exercise per Session: 30 min  Stress: No Stress Concern Present (06/23/2022)   [place]    Feeling of Stress : Not at all  Social Connections: Socially Isolated (06/23/2022)   Social Connection and Isolation Panel [NHANES]    Frequency of Communication with Friends and  Family: More than three times a week    Frequency of Social Gatherings with Friends and Family: More than three times a week    Attends Religious Services: Never    Marine scientist or Organizations: No    Attends Music therapist: Never    Marital Status: Divorced    Tobacco Counseling Ready to quit: Not Answered Counseling given: Not Answered   Clinical Intake:  Pre-visit preparation completed: Yes  Pain : No/denies pain     Nutritional Risks: None Diabetes: No  How often do you need to have someone help you when you read instructions, pamphlets, or other written materials from your doctor or pharmacy?: 1 - Never  Diabetic?no   Interpreter Needed?: No  Information entered by :: Jadene Pierini, LPN   Activities of Daily Living    06/23/2022   11:29 AM  In your present state of health, do you have any difficulty performing the following activities:  Hearing? 0  Vision? 0  Difficulty concentrating or making decisions? 0  Walking or climbing stairs? 0  Dressing or bathing? 0  Doing errands, shopping? 0  Preparing Food and eating ? N  Using the Toilet? N  In the past six months, have you accidently leaked urine? N  Do you have problems with loss of bowel control? N  Managing your Medications? N  Managing your Finances? N  Housekeeping or managing your Housekeeping? N    Patient Care Team: Hoyt Koch, MD as PCP - General (Internal Medicine) Leeroy Cha, MD as Consulting Physician (Neurosurgery) Clydell Hakim, MD (Inactive) as Consulting Physician (Pain Medicine) Richmond Campbell, MD as Consulting Physician (Gastroenterology) [facility] [place] (Vascular Surgery)  Indicate any recent Medical Services you may have received from other than [facility] providers in the past year (date may be approximate).     Assessment:   This is a routine wellness examination for Anhthu.  Hearing/Vision screen Vision Screening - Comments::  Annual eye exams declined , Dr Truman Hayward   Dietary issues and exercise activities discussed: Current Exercise Habits: Home exercise routine, Type of exercise: walking, Time (Minutes): 30, Frequency (Times/Week): 3, Weekly Exercise (Minutes/Week): 90, Intensity: Mild, Exercise limited by: None identified   Goals Addressed             This Visit's Progress    Exercise 150 min/wk Moderate Activity   On track      Depression Screen    06/23/2022   11:27 AM 05/22/2022   11:13 AM 03/07/2022    1:56 PM 01/29/2022   11:00 AM 06/19/2021    5:18 PM 12/10/2020    2:22 PM 11/08/2020    1:46 PM  PHQ 2/9 Scores  PHQ - 2 Score 0 0 0 0 0 0 0  PHQ- 9 Score 0 2 0 0       Fall Risk    06/23/2022   11:26 AM 05/22/2022   11:14 AM 03/07/2022    1:57 PM 01/29/2022   11:01 AM 06/19/2021    5:18 PM  Fall Risk   Falls in the past year? 0 0 0 1 0  Number falls in past yr: 0  0 0 0  Injury with Fall? 0 0 0 1 0  Risk for fall due to : No Fall Risks      Follow up Falls prevention discussed        FALL RISK PREVENTION PERTAINING TO THE HOME:  Any stairs in or around the home? Yes  If so, are there any without handrails? No  Home free of loose throw rugs in walkways, pet beds, electrical cords, etc? Yes  Adequate lighting in your home to reduce risk of falls? Yes   ASSISTIVE DEVICES UTILIZED TO PREVENT FALLS:  Life alert? No  Use of a cane, walker or w/c? No  Grab bars in the bathroom? Yes  Shower chair or bench in shower? Yes  Elevated toilet seat or a handicapped toilet? Yes          06/23/2022   11:29 AM 06/19/2021    5:23 PM 03/13/2020    8:25 AM  6CIT Screen  What Year? 0 points 0 points 0 points  What month? 0 points 0 points 0 points  What time? 0 points 0 points 0 points  Count back from 20 0 points 0 points 0 points  Months in reverse 0 points 0 points 0 points  Repeat phrase 2 points 8 points 0 points  Total Score 2 points 8 points 0 points    Immunizations Immunization History   Administered Date(s) Administered   Fluad Quad(high Dose 65+) 06/11/2019   Influenza, High Dose Seasonal PF 07/27/2017, 06/29/2018, 06/18/2021   Influenza, Seasonal, Injecte, Preservative Fre 07/04/2014, 08/09/2015   Influenza-Unspecified 07/04/2014, 08/09/2015, 07/27/2017, 06/29/2018, 06/13/2019, 07/16/2020   Moderna Covid-19 Vaccine Bivalent Booster 36yr & up 06/18/2021   Moderna Sars-Covid-2 Vaccination 11/28/2019, 12/26/2019, 05/21/2020, 01/08/2021   PNEUMOCOCCAL CONJUGATE-20 03/07/2021   Pneumococcal Conjugate-13 05/10/2014, 08/09/2015   Pneumococcal Polysaccharide-23 04/02/2017   Tdap 01/31/2013   Zoster Recombinat (Shingrix) 12/04/2016, 07/11/2019, 09/27/2019   Zoster, Live 07/04/2014    TDAP status: Up to date  Flu Vaccine status: Up to date  Pneumococcal vaccine status: Up to date  Covid-19 vaccine status: Completed vaccines  Qualifies for Shingles Vaccine? Yes   Zostavax completed Yes   Shingrix Completed?: Yes  Screening Tests Health Maintenance  Topic Date Due   CUTMLY-65  Vaccine (6 - Moderna series) 10/18/2021   INFLUENZA VACCINE  04/29/2022   MAMMOGRAM  10/24/2022   TETANUS/TDAP  02/01/2023   Pneumonia Vaccine 34+ Years old  Completed   DEXA SCAN  Completed   Hepatitis C Screening  Completed   Zoster Vaccines- Shingrix  Completed   HPV VACCINES  Aged Out   COLONOSCOPY (Pts 45-74yr Insurance coverage will need to be confirmed)  D[place]arkson ValleyMaintenance Due  Topic Date Due   COVID-19 Vaccine (6 - Moderna series) 10/18/2021   INFLUENZA VACCINE  04/29/2022    Colorectal cancer screening: No longer required.   Mammogram status: No longer required due to age.  Bone Density status: Completed 04/17/2017. Results reflect: Bone density results: OSTEOPENIA. Repeat every 5 years.  Lung Cancer Screening: (Low Dose CT Chest recommended if Age 78-80years, 30 pack-year currently smoking OR have quit w/in 15years.) does not  qualify.   Lung Cancer Screening Referral: n/a  Additional Screening:  Hepatitis C Screening: does not qualify;   Vision Screening: Recommended annual ophthalmology exams for early detection of glaucoma and other disorders of the eye. Is the patient up to date with their annual eye exam?  Yes  Who is the provider or what is the name of the office in which the patient attends annual eye exams? Dr.Lee  If pt is not established with a provider, would they like to be referred to a provider to establish care? No .   Dental Screening: Recommended annual dental exams for proper oral hygiene  Community Resource Referral / Chronic Care Management: CRR required this visit?  No   CCM required this visit?  No      Plan:     I have personally reviewed and noted the following in the patient's chart:   Medical and social history Use of alcohol, tobacco or illicit drugs  Current medications and supplements including opioid prescriptions. Patient is not currently taking opioid prescriptions. Functional ability and status Nutritional status Physical activity Advanced directives List of other physicians Hospitalizations, surgeries, and ER visits in previous 12 months Vitals Screenings to include cognitive, depression, and falls Referrals and appointments  In addition, I have reviewed and discussed with patient certain preventive protocols, quality metrics, and best practice recommendations. A written personalized care plan for preventive services as well as general preventive health recommendations were provided to patient.     LDaphane Shepherd LPN   [id number]  Nurse Notes: none

## 2022-06-23 NOTE — Patient Instructions (Signed)
[name] , Thank you for taking time to come for your Medicare Wellness Visit. I appreciate your ongoing commitment to your health goals. Please review the following plan we discussed and let me know if I can assist you in the future.   These are the goals we discussed:  Goals      Exercise 150 min/wk Moderate Activity     Have 3 meals a day        This is a list of the screening recommended for you and due dates:  Health Maintenance  Topic Date Due   COVID-19 Vaccine (6 - Moderna series) 10/18/2021   Flu Shot  04/29/2022   Mammogram  10/24/2022   Tetanus Vaccine  02/01/2023   Pneumonia Vaccine  Completed   DEXA scan (bone density measurement)  Completed   Hepatitis C Screening: USPSTF Recommendation to screen - Ages 64-79 yo.  Completed   Zoster (Shingles) Vaccine  Completed   HPV Vaccine  Aged Out   Colon Cancer Screening  Discontinued    Advanced directives:Please bring a copy of your health care power of attorney and living will to the office to be added to your chart at your convenience.   Conditions/risks identified: Aim for 30 minutes of exercise or brisk walking, 6-8 glasses of water, and 5 servings of fruits and vegetables each day.   Next appointment: Follow up in one year for your annual wellness visit    Preventive Care 65 Years and Older, Female Preventive care refers to lifestyle choices and visits with your health care provider that can promote health and wellness. What does preventive care include? A yearly physical exam. This is also called an annual well check. Dental exams once or twice a year. Routine eye exams. Ask your health care provider how often you should have your eyes checked. Personal lifestyle choices, including: Daily care of your teeth and gums. Regular physical activity. Eating a healthy diet. Avoiding tobacco and drug use. Limiting alcohol use. Practicing safe sex. Taking low-dose aspirin every day. Taking vitamin and mineral  supplements as recommended by your health care provider. What happens during an annual well check? The services and screenings done by your health care provider during your annual well check will depend on your age, overall health, lifestyle risk factors, and family history of disease. Counseling  Your health care provider may ask you questions about your: Alcohol use. Tobacco use. Drug use. Emotional well-being. Home and relationship well-being. Sexual activity. Eating habits. History of falls. Memory and ability to understand (cognition). Work and work Statistician. Reproductive health. Screening  You may have the following tests or measurements: Height, weight, and BMI. Blood pressure. Lipid and cholesterol levels. These may be checked every 5 years, or more frequently if you are over 69 years old. Skin check. Lung cancer screening. You may have this screening every year starting at age 93 if you have a 30-pack-year history of smoking and currently smoke or have quit within the past 15 years. Fecal occult blood test (FOBT) of the stool. You may have this test every year starting at age 20. Flexible sigmoidoscopy or colonoscopy. You may have a sigmoidoscopy every 5 years or a colonoscopy every 10 years starting at age 103. Hepatitis C blood test. Hepatitis B blood test. Sexually transmitted disease (STD) testing. Diabetes screening. This is done by checking your blood sugar (glucose) after you have not eaten for a while (fasting). You may have this done every 1-3 years. Bone density scan. This  is done to screen for osteoporosis. You may have this done starting at age 92. Mammogram. This may be done every 1-2 years. Talk to your health care provider about how often you should have regular mammograms. Talk with your health care provider about your test results, treatment options, and if necessary, the need for more tests. Vaccines  Your health care provider may recommend certain  vaccines, such as: Influenza vaccine. This is recommended every year. Tetanus, diphtheria, and acellular pertussis (Tdap, Td) vaccine. You may need a Td booster every 10 years. Zoster vaccine. You may need this after age 75. Pneumococcal 13-valent conjugate (PCV13) vaccine. One dose is recommended after age 51. Pneumococcal polysaccharide (PPSV23) vaccine. One dose is recommended after age 5. Talk to your health care provider about which screenings and vaccines you need and how often you need them. This information is not intended to replace advice given to you by your health care provider. Make sure you discuss any questions you have with your health care provider. Document Released: 10/12/2015 Document Revised: 06/04/2016 Document Reviewed: 07/17/2015 Elsevier Interactive Patient Education  2017 [employer] Prevention in the Home Falls can cause injuries. They can happen to people of all ages. There are many things you can do to make your home safe and to help prevent falls. What can I do on the outside of my home? Regularly fix the edges of walkways and driveways and fix any cracks. Remove anything that might make you trip as you walk through a door, such as a raised step or threshold. Trim any bushes or trees on the path to your home. Use bright outdoor lighting. Clear any walking paths of anything that might make someone trip, such as rocks or tools. Regularly check to see if handrails are loose or broken. Make sure that both sides of any steps have handrails. Any raised decks and porches should have guardrails on the edges. Have any leaves, snow, or ice cleared regularly. Use sand or salt on walking paths during winter. Clean up any spills in your garage right away. This includes oil or grease spills. What can I do in the bathroom? Use night lights. Install grab bars by the toilet and in the tub and shower. Do not use towel bars as grab bars. Use non-skid mats or decals in  the tub or shower. If you need to sit down in the shower, use a plastic, non-slip stool. Keep the floor dry. Clean up any water that spills on the floor as soon as it happens. Remove soap buildup in the tub or shower regularly. Attach bath mats securely with double-sided non-slip rug tape. Do not have throw rugs and other things on the floor that can make you trip. What can I do in the bedroom? Use night lights. Make sure that you have a light by your bed that is easy to reach. Do not use any sheets or blankets that are too big for your bed. They should not hang down onto the floor. Have a firm chair that has side arms. You can use this for support while you get dressed. Do not have throw rugs and other things on the floor that can make you trip. What can I do in the kitchen? Clean up any spills right away. Avoid walking on wet floors. Keep items that you use a lot in easy-to-reach places. If you need to reach something above you, use a strong step stool that has a grab bar. Keep electrical cords out  of the way. Do not use floor polish or wax that makes floors slippery. If you must use wax, use non-skid floor wax. Do not have throw rugs and other things on the floor that can make you trip. What can I do with my stairs? Do not leave any items on the stairs. Make sure that there are handrails on both sides of the stairs and use them. Fix handrails that are broken or loose. Make sure that handrails are as long as the stairways. Check any carpeting to make sure that it is firmly attached to the stairs. Fix any carpet that is loose or worn. Avoid having throw rugs at the top or bottom of the stairs. If you do have throw rugs, attach them to the floor with carpet tape. Make sure that you have a light switch at the top of the stairs and the bottom of the stairs. If you do not have them, ask someone to add them for you. What else can I do to help prevent falls? Wear shoes that: Do not have high  heels. Have rubber bottoms. Are comfortable and fit you well. Are closed at the toe. Do not wear sandals. If you use a stepladder: Make sure that it is fully opened. Do not climb a closed stepladder. Make sure that both sides of the stepladder are locked into place. Ask someone to hold it for you, if possible. Clearly mark and make sure that you can see: Any grab bars or handrails. First and last steps. Where the edge of each step is. Use tools that help you move around (mobility aids) if they are needed. These include: Canes. Walkers. Scooters. Crutches. Turn on the lights when you go into a dark area. Replace any light bulbs as soon as they burn out. Set up your furniture so you have a clear path. Avoid moving your furniture around. If any of your floors are uneven, fix them. If there are any pets around you, be aware of where they are. Review your medicines with your doctor. Some medicines can make you feel dizzy. This can increase your chance of falling. Ask your doctor what other things that you can do to help prevent falls. This information is not intended to replace advice given to you by your health care provider. Make sure you discuss any questions you have with your health care provider. Document Released: 07/12/2009 Document Revised: 02/21/2016 Document Reviewed: 10/20/2014 Elsevier Interactive Patient Education  2017 [employer].

## 2022-06-25 DIAGNOSIS — M47817 Spondylosis without myelopathy or radiculopathy, lumbosacral region: Secondary | ICD-10-CM | POA: Diagnosis not present

## 2022-06-25 DIAGNOSIS — Z681 Body mass index (BMI) 19 or less, adult: Secondary | ICD-10-CM | POA: Diagnosis not present

## 2022-06-25 DIAGNOSIS — G588 Other specified mononeuropathies: Secondary | ICD-10-CM | POA: Diagnosis not present

## 2022-07-04 ENCOUNTER — Ambulatory Visit: Payer: Medicare Other | Admitting: Internal Medicine

## 2022-07-07 DIAGNOSIS — G588 Other specified mononeuropathies: Secondary | ICD-10-CM | POA: Diagnosis not present

## 2022-07-08 ENCOUNTER — Ambulatory Visit (HOSPITAL_COMMUNITY): Payer: Medicare Other

## 2022-07-08 ENCOUNTER — Ambulatory Visit: Payer: Medicare Other | Admitting: Vascular Surgery

## 2022-07-09 ENCOUNTER — Ambulatory Visit: Payer: Medicare Other | Admitting: Internal Medicine

## 2022-07-15 ENCOUNTER — Ambulatory Visit: Payer: Medicare Other | Admitting: Internal Medicine

## 2022-07-17 ENCOUNTER — Ambulatory Visit: Payer: Medicare Other | Admitting: Internal Medicine

## 2022-08-05 ENCOUNTER — Ambulatory Visit (HOSPITAL_COMMUNITY)
Admission: RE | Admit: 2022-08-05 | Discharge: 2022-08-05 | Disposition: A | Discharge status: Home / Self Care | Payer: Medicare Other | Source: Ambulatory Visit | Attending: Vascular Surgery | Admitting: Vascular Surgery

## 2022-08-05 ENCOUNTER — Ambulatory Visit (INDEPENDENT_AMBULATORY_CARE_PROVIDER_SITE_OTHER): Payer: Medicare Other | Admitting: Vascular Surgery

## 2022-08-05 ENCOUNTER — Encounter: Payer: Self-pay | Admitting: Vascular Surgery

## 2022-08-05 VITALS — BP 151/102 | HR 83 | Temp 97.9°F | Resp 20 | Ht 66.0 in | Wt 110.0 lb

## 2022-08-05 DIAGNOSIS — I7143 Infrarenal abdominal aortic aneurysm, without rupture: Secondary | ICD-10-CM | POA: Insufficient documentation

## 2022-08-05 NOTE — Progress Notes (Signed)
Patient name: [name] MRN: [id number] DOB: [birth year] Sex: female  REASON FOR VISIT: 6 month follow-up for surveillance of abdominal aortic aneurysm and celiac artery aneurysm  HPI: [name] is a 76 y.o. female with history of hyperlipidemia as well as tobacco abuse presents for ongoing surveillance of her abdominal aortic aneurysm and small celiac aneurysm.  She was previously followed by Dr. Kellie Simmering for a 2.7 cm AAA initially diagnosed in 2015.  Last seen in our clinic last year when her AAA was 4.26 cm cm and celiac aneurysm 1.76 cm.    She reports no significant changes to her health over the last year.  Still smoking 8 cigarettes a day.  No new abdominal pain.  Chronic back pain at baseline.  No new concerns today.  Her mother and grandmother died of AAA.   Past Medical History:  Diagnosis Date   AAA (abdominal aortic aneurysm) ([place])    saw Dr Kellie Simmering 11/2013.  2.7 cm   Arthritis    Chicken pox    COPD (chronic obstructive pulmonary disease) ([facility])    smokes 1/2ppd   DDD (degenerative disc disease), lumbar    GERD (gastroesophageal reflux disease)    diet controlled   History of blood transfusion    History of lumbar fusion 08/09/2014   Hyperlipidemia    IBS (irritable bowel syndrome)    diet contolled   Mild mitral regurgitation    RBBB    S/P arthroscopy of right shoulder 12/15/2017   Seasonal allergies    Varicose veins     Past Surgical History:  Procedure Laterality Date   APPENDECTOMY     BACK SURGERY N/A [phone]   5 back surgeries   BREAST BIOPSY  1989,05   lt br bx   BREAST EXCISIONAL BIOPSY Bilateral    CATARACT EXTRACTION W/ INTRAOCULAR LENS IMPLANT Bilateral 2018   CHOLECYSTECTOMY     EXCISION/RELEASE BURSA HIP Right 05/12/2013   Procedure: EXCISION RIGHT TROCHANTERIC BURSA/CALCIFICATION ;  Surgeon: Ninetta Lights, MD;  Location: [facility];  Service: Orthopedics;  Laterality: Right;   FASCIOTOMY Right 05/12/2013    Procedure: RIGHT FASCIOTOMY HIP ANY TYPE;  Surgeon: Ninetta Lights, MD;  Location: [facility];  Service: Orthopedics;  Laterality: Right;   LARYNGOSCOPY AND BRONCHOSCOPY N/A 08/15/2019   Procedure: BRONCHOSCOPY/LARYNGOSCOPY;  Surgeon: Leta Baptist, MD;  Location: [facility];  Service: ENT;  Laterality: N/A;   NM [facility]  04/13/2003   negative   SHOULDER ARTHROSCOPY WITH OPEN ROTATOR CUFF REPAIR Right 2018   skin cancer removed right leg     TONSILLECTOMY AND ADENOIDECTOMY  1950   TOTAL HIP ARTHROPLASTY Left 2006   lt total hip   TOTAL HIP ARTHROPLASTY Right 2011   TOTAL HIP ARTHROPLASTY Right 2016   second surgery due to recall   US ECHOCARDIOGRAPHY  04/13/2003   EF 65%, mild TR w/suggestion of mild pulmonary hypertension,thickened anterior leaflet,central mild MR.   VAGINAL HYSTERECTOMY  1993    Family History  Problem Relation Age of Onset   Early death Mother        aortic aneurysm   Vasculitis Mother        temporal arteritis   Aortic aneurysm Mother    Heart disease Mother        before age 63   Hyperlipidemia Mother    Hypertension Mother    COPD Sister    Hypertension Sister    Depression  Sister    Heart disease Sister        before age 65   Post-traumatic stress disorder Sister    Bipolar disorder Sister    Brain cancer Maternal Aunt 84       brain   Heart disease Maternal Grandmother        aortic aneurysm   Aortic aneurysm Maternal Grandmother    Hypertension Sister    Throat cancer Maternal Grandfather    Breast cancer Neg Hx     SOCIAL HISTORY: Social History   Tobacco Use   Smoking status: Every Day    Packs/day: 0.75    Years: 30.00    Total pack years: 22.50    Types: Cigarettes   Smokeless tobacco: Never  Substance Use Topics   Alcohol use: Not Currently    Alcohol/week: 0.0 standard drinks of alcohol    Comment: very occasional, not monthly    Allergies  Allergen Reactions   Bee Venom     Other  reaction(s): Redness   Cephalexin Other (See Comments)    Headaches   Gabapentin Diarrhea    Current Outpatient Medications  Medication Sig Dispense Refill   atorvastatin (LIPITOR) 20 MG tablet Take 1 tablet (20 mg total) by mouth daily. 90 tablet 3   cyclobenzaprine (FLEXERIL) 5 MG tablet Take 1 tablet (5 mg total) by mouth 3 (three) times daily as needed for muscle spasms. 30 tablet 1   diazepam (VALIUM) 5 MG tablet TAKE ONE TABLET DAILY AS NEEDED FOR ANXIETY 30 tablet 1   HYDROcodone-acetaminophen (NORCO) 10-325 MG tablet Take 1 tablet by mouth 3 (three) times daily as needed for moderate pain.     ipratropium (ATROVENT) 0.06 % nasal spray Place 1-2 sprays into both nostrils in the morning and at bedtime.     levocetirizine (XYZAL) 5 MG tablet Take 5 mg by mouth at bedtime.     montelukast (SINGULAIR) 10 MG tablet Take 1 tablet at bedtime. 90 tablet 3   Multiple Vitamin (MULTIVITAMIN) tablet Take 1 tablet by mouth daily.     promethazine (PHENERGAN) 12.5 MG tablet TAKE ONE TABLET EVERY 8 HOURS AS NEEDED FOR NAUSEA AND VOMITING 20 tablet 0   vitamin B-12 (CYANOCOBALAMIN) 1000 MCG tablet Take 1 tablet (1,000 mcg total) by mouth daily. 100 tablet 2   No current facility-administered medications for this visit.    REVIEW OF SYSTEMS:  '[X]'$  denotes positive finding, '[ ]'$  denotes negative finding Cardiac  Comments:  Chest pain or chest pressure:    Shortness of breath upon exertion:    Short of breath when lying flat:    Irregular heart rhythm:        Vascular    Pain in calf, thigh, or hip brought on by ambulation:    Pain in feet at night that wakes you up from your sleep:     Blood clot in your veins:    Leg swelling:         Pulmonary    Oxygen at home:    Productive cough:     Wheezing:         Neurologic    Sudden weakness in arms or legs:     Sudden numbness in arms or legs:     Sudden onset of difficulty speaking or slurred speech:    Temporary loss of vision in one  eye:     Problems with dizziness:         Gastrointestinal    Blood in  stool:     Vomited blood:         Genitourinary    Burning when urinating:     Blood in urine:        Psychiatric    Major depression:         Hematologic    Bleeding problems:    Problems with blood clotting too easily:        Skin    Rashes or ulcers:        Constitutional    Fever or chills:      PHYSICAL EXAM: Vitals:   08/05/22 1020  BP: (!) 151/102  Pulse: 83  Resp: 20  Temp: 97.9 F (36.6 C)  SpO2: 94%  Weight: 110 lb (49.9 kg)  Height: '5\' 6"'$  (1.676 m)    GENERAL: The patient is a well-nourished female, in no acute distress. The vital signs are documented above. CARDIAC: There is a regular rate and rhythm.  VASCULAR:  2+ femoral pulses palpable bilaterally Right DP and left PT palpable PULMONARY: No respiratory distress. ABDOMEN:   No pain with deep palpation of her abdomen and no pain with palpation of aneurysm. MUSCULOSKELETAL: There are no major deformities or cyanosis. NEUROLOGIC: No focal weakness or paresthesias are detected. PSYCHIATRIC: The patient has a normal affect.  DATA:   AAA duplex today suggest that her abdominal aortic aneurysm has grown from 4.26 cm to 4.6 cm over the past 6 months.  The celiac artery aneurysm no growth and last measured 1.76 cm and now 1.59 cm today.    Assessment/Plan:  76 year old female has been followed for known abdominal aortic aneurysm as well as small celiac artery aneurysm.  She presents for 6 month follow-up and interval surveillance today.  Discussed that her AAA duplex shows continued growth increasing from 4.26 to 4.6 cm over the last 6 months.  Her celiac aneurysm has not shown any growth measuring a maximal diameter of 1.59 cm today.  Discussed again in women we repair AAA's a greater than 5 cm.  Discussed we will repeat a AAA duplex in 6 months.  Again discussed the importance of smoking cessation and keeping her blood pressure well  controlled.  Marty Heck, MD [facility] Office: [place]

## 2022-08-06 DIAGNOSIS — M47817 Spondylosis without myelopathy or radiculopathy, lumbosacral region: Secondary | ICD-10-CM | POA: Diagnosis not present

## 2022-08-06 DIAGNOSIS — Z681 Body mass index (BMI) 19 or less, adult: Secondary | ICD-10-CM | POA: Diagnosis not present

## 2022-08-12 ENCOUNTER — Other Ambulatory Visit: Payer: Self-pay

## 2022-08-12 DIAGNOSIS — I714 Abdominal aortic aneurysm, without rupture, unspecified: Secondary | ICD-10-CM

## 2022-09-02 DIAGNOSIS — M47817 Spondylosis without myelopathy or radiculopathy, lumbosacral region: Secondary | ICD-10-CM | POA: Diagnosis not present

## 2022-09-04 ENCOUNTER — Other Ambulatory Visit: Payer: Self-pay | Admitting: Internal Medicine

## 2022-09-04 DIAGNOSIS — G43009 Migraine without aura, not intractable, without status migrainosus: Secondary | ICD-10-CM

## 2022-09-08 ENCOUNTER — Ambulatory Visit: Payer: Medicare Other | Admitting: Internal Medicine

## 2022-09-16 ENCOUNTER — Encounter: Payer: Self-pay | Admitting: Internal Medicine

## 2022-09-16 ENCOUNTER — Ambulatory Visit (INDEPENDENT_AMBULATORY_CARE_PROVIDER_SITE_OTHER): Payer: Medicare Other | Admitting: Internal Medicine

## 2022-09-16 VITALS — BP 140/60 | HR 66 | Temp 98.4°F | Ht 66.0 in | Wt 108.0 lb

## 2022-09-16 DIAGNOSIS — E782 Mixed hyperlipidemia: Secondary | ICD-10-CM | POA: Diagnosis not present

## 2022-09-16 DIAGNOSIS — K591 Functional diarrhea: Secondary | ICD-10-CM

## 2022-09-16 DIAGNOSIS — R197 Diarrhea, unspecified: Secondary | ICD-10-CM | POA: Insufficient documentation

## 2022-09-16 MED ORDER — CYCLOBENZAPRINE HCL 5 MG PO TABS: 5.0000 mg | ORAL_TABLET | Freq: Three times a day (TID) | ORAL | 1 refills | Status: DC | PRN

## 2022-09-16 MED ORDER — ATORVASTATIN CALCIUM 20 MG PO TABS: 20.0000 mg | ORAL_TABLET | Freq: Every day | ORAL | 3 refills | Status: DC

## 2022-09-16 MED ORDER — DIAZEPAM 5 MG PO TABS
ORAL_TABLET | ORAL | 1 refills | Status: DC
Start: 2022-09-16 — End: 2023-01-16

## 2022-09-16 NOTE — Assessment & Plan Note (Signed)
Could be related to not have diazepam (she uses 2-3 times per week). Refilled today. She also had similar symptoms with B1/B12 deficiency and had stopped those vitamins (taking multivitamin). She will resume and if no improvement let us know we can try pancreatic enzymes.

## 2022-09-16 NOTE — Patient Instructions (Addendum)
We have sent in the refills.  Try going back on B1 and B12 and let us know if this does not help.

## 2022-09-16 NOTE — Progress Notes (Signed)
   Subjective:   Patient ID: [name], female    DOB: [birth year], 76 y.o.   MRN: [id number]  GI Problem The primary symptoms include diarrhea and arthralgias. Primary symptoms do not include abdominal pain, nausea or vomiting.  The illness is also significant for back pain. The illness does not include constipation.   The patient is a 76 YO female coming in for follow up new diarrhea.  Review of Systems  Constitutional: Negative.   HENT: Negative.    Eyes: Negative.   Respiratory:  Negative for cough, chest tightness and shortness of breath.   Cardiovascular:  Negative for chest pain, palpitations and leg swelling.  Gastrointestinal:  Positive for diarrhea. Negative for abdominal distention, abdominal pain, constipation, nausea and vomiting.  Musculoskeletal:  Positive for arthralgias and back pain.  Skin: Negative.   Neurological: Negative.   Psychiatric/Behavioral: Negative.      Objective:  Physical Exam Constitutional:      Appearance: She is well-developed.     Comments: thin  HENT:     Head: Normocephalic and atraumatic.  Cardiovascular:     Rate and Rhythm: Normal rate and regular rhythm.  Pulmonary:     Effort: Pulmonary effort is normal. No respiratory distress.     Breath sounds: Normal breath sounds. No wheezing or rales.  Abdominal:     General: Bowel sounds are normal. There is no distension.     Palpations: Abdomen is soft.     Tenderness: There is no abdominal tenderness. There is no rebound.  Musculoskeletal:        General: Tenderness present.     Cervical back: Normal range of motion.  Skin:    General: Skin is warm and dry.  Neurological:     Mental Status: She is alert and oriented to person, place, and time.     Coordination: Coordination normal.     Vitals:   09/16/22 1431 09/16/22 1438  BP: (!) 140/60 (!) 140/60  Pulse: 66   Temp: 98.4 F (36.9 C)   TempSrc: Oral   SpO2: 92%   Weight: 108 lb (49 kg)   Height: '5\' 6"'$  (1.676 m)      Assessment & Plan:

## 2022-09-18 ENCOUNTER — Other Ambulatory Visit: Payer: Self-pay | Admitting: Internal Medicine

## 2022-09-18 DIAGNOSIS — G43009 Migraine without aura, not intractable, without status migrainosus: Secondary | ICD-10-CM

## 2022-10-06 ENCOUNTER — Telehealth: Payer: Self-pay

## 2022-10-06 NOTE — Telephone Encounter (Signed)
FYI:  The pt has called in and stated the Vitamin B12 and B1 has helped her.

## 2022-10-27 ENCOUNTER — Other Ambulatory Visit: Payer: Self-pay | Admitting: Internal Medicine

## 2022-10-28 ENCOUNTER — Other Ambulatory Visit: Payer: Self-pay | Admitting: Internal Medicine

## 2022-10-28 DIAGNOSIS — J302 Other seasonal allergic rhinitis: Secondary | ICD-10-CM

## 2022-11-17 DIAGNOSIS — Z681 Body mass index (BMI) 19 or less, adult: Secondary | ICD-10-CM | POA: Diagnosis not present

## 2022-11-17 DIAGNOSIS — M47817 Spondylosis without myelopathy or radiculopathy, lumbosacral region: Secondary | ICD-10-CM | POA: Diagnosis not present

## 2022-11-20 ENCOUNTER — Telehealth: Payer: Self-pay | Admitting: Internal Medicine

## 2022-11-20 NOTE — Telephone Encounter (Signed)
We normally dont send antibx without an exam to see if appropriate; ok to try OTC Mucinex and nasacort though as these are safe

## 2022-11-20 NOTE — Telephone Encounter (Signed)
Spoke with patient and explained to her that we are not able to send out antibiotics without her being seen in office and patient would need to do so in order to receive it. She was advised by Dr Jenny Reichmann while Dr Sharlet Salina was out.

## 2022-11-20 NOTE — Telephone Encounter (Signed)
Pt called in feeling bad ( head cold)  she said she have taken a lot medication is not working for her, Pt is wondering can Dr Sharlet Salina call her something in.  Preferred Pharmacy: [facility], [place]

## 2022-12-16 ENCOUNTER — Ambulatory Visit: Payer: Medicare Other | Admitting: Internal Medicine

## 2022-12-23 ENCOUNTER — Ambulatory Visit: Payer: 59 | Admitting: Internal Medicine

## 2023-01-02 ENCOUNTER — Other Ambulatory Visit: Payer: Self-pay | Admitting: Internal Medicine

## 2023-01-02 DIAGNOSIS — G43009 Migraine without aura, not intractable, without status migrainosus: Secondary | ICD-10-CM

## 2023-01-03 DIAGNOSIS — M549 Dorsalgia, unspecified: Secondary | ICD-10-CM | POA: Diagnosis not present

## 2023-01-03 DIAGNOSIS — R0981 Nasal congestion: Secondary | ICD-10-CM | POA: Diagnosis not present

## 2023-01-03 DIAGNOSIS — J0100 Acute maxillary sinusitis, unspecified: Secondary | ICD-10-CM | POA: Diagnosis not present

## 2023-01-03 DIAGNOSIS — I10 Essential (primary) hypertension: Secondary | ICD-10-CM | POA: Diagnosis not present

## 2023-01-15 ENCOUNTER — Other Ambulatory Visit: Payer: Self-pay

## 2023-01-16 ENCOUNTER — Other Ambulatory Visit: Payer: Self-pay

## 2023-01-16 MED ORDER — PROMETHAZINE HCL 12.5 MG PO TABS: ORAL_TABLET | ORAL | 0 refills | Status: DC

## 2023-01-16 MED ORDER — DIAZEPAM 5 MG PO TABS: ORAL_TABLET | ORAL | 1 refills | Status: DC

## 2023-01-27 ENCOUNTER — Telehealth: Payer: Self-pay | Admitting: Internal Medicine

## 2023-01-27 NOTE — Telephone Encounter (Signed)
[name] from [facility] called and said she has faxed over a FL2 for the patient on 12/30/22 and 01/13/23. She will re-send it today. Please keep an eye out for it and call her back at [phone] ext [phone] when it is received.

## 2023-01-27 NOTE — Telephone Encounter (Signed)
I have already fax this over twice but will re fax it again

## 2023-01-29 DIAGNOSIS — M25552 Pain in left hip: Secondary | ICD-10-CM | POA: Diagnosis not present

## 2023-01-29 DIAGNOSIS — S7002XA Contusion of left hip, initial encounter: Secondary | ICD-10-CM | POA: Diagnosis not present

## 2023-01-29 DIAGNOSIS — R52 Pain, unspecified: Secondary | ICD-10-CM | POA: Diagnosis not present

## 2023-01-29 DIAGNOSIS — Z043 Encounter for examination and observation following other accident: Secondary | ICD-10-CM | POA: Diagnosis not present

## 2023-02-06 ENCOUNTER — Ambulatory Visit: Payer: 59 | Admitting: Internal Medicine

## 2023-02-10 ENCOUNTER — Ambulatory Visit (INDEPENDENT_AMBULATORY_CARE_PROVIDER_SITE_OTHER)
Admission: RE | Admit: 2023-02-10 | Discharge: 2023-02-10 | Disposition: A | Discharge status: Home / Self Care | Payer: 59 | Source: Ambulatory Visit | Attending: Internal Medicine | Admitting: Internal Medicine

## 2023-02-10 ENCOUNTER — Ambulatory Visit (INDEPENDENT_AMBULATORY_CARE_PROVIDER_SITE_OTHER): Payer: 59 | Admitting: Internal Medicine

## 2023-02-10 ENCOUNTER — Encounter: Payer: Self-pay | Admitting: Internal Medicine

## 2023-02-10 VITALS — BP 174/122 | HR 105 | Temp 97.7°F | Ht 66.0 in | Wt 115.0 lb

## 2023-02-10 DIAGNOSIS — M25552 Pain in left hip: Secondary | ICD-10-CM | POA: Diagnosis not present

## 2023-02-10 DIAGNOSIS — M5410 Radiculopathy, site unspecified: Secondary | ICD-10-CM | POA: Diagnosis not present

## 2023-02-10 DIAGNOSIS — M545 Low back pain, unspecified: Secondary | ICD-10-CM | POA: Diagnosis not present

## 2023-02-10 MED ORDER — MELOXICAM 15 MG PO TABS: 15.0000 mg | ORAL_TABLET | Freq: Every day | ORAL | 0 refills | Status: DC

## 2023-02-10 NOTE — Patient Instructions (Addendum)
We will do the x-ray of the hip and low back.  We have sent in meloxicam to take 1 pill daily for pain and given you a pain medicine shot today.

## 2023-02-10 NOTE — Progress Notes (Signed)
   Subjective:   Patient ID: [name], female    DOB: 1946-01-21, 77 y.o.   MRN: [id number]  HPI The patient is a 77 YO female coming in for left hip pain s/p fall. Went to urgent care with x-ray and no fracture. Bilateral hip replacement. On chronic hydrocodone for pain.  Review of Systems  Constitutional: Negative.   HENT: Negative.    Eyes: Negative.   Respiratory:  Negative for cough, chest tightness and shortness of breath.   Cardiovascular:  Negative for chest pain, palpitations and leg swelling.  Gastrointestinal:  Negative for abdominal distention, abdominal pain, constipation, diarrhea, nausea and vomiting.  Musculoskeletal:  Positive for arthralgias and gait problem.  Skin: Negative.   Psychiatric/Behavioral: Negative.      Objective:  Physical Exam Constitutional:      Appearance: She is well-developed.  HENT:     Head: Normocephalic and atraumatic.  Cardiovascular:     Rate and Rhythm: Normal rate and regular rhythm.  Pulmonary:     Effort: Pulmonary effort is normal. No respiratory distress.     Breath sounds: Normal breath sounds. No wheezing or rales.  Abdominal:     General: Bowel sounds are normal. There is no distension.     Palpations: Abdomen is soft.     Tenderness: There is no abdominal tenderness. There is no rebound.  Musculoskeletal:        General: Tenderness present.     Cervical back: Normal range of motion.  Skin:    General: Skin is warm and dry.  Neurological:     Mental Status: She is alert and oriented to person, place, and time.     Coordination: Coordination abnormal.     Comments: Pain lumbar spine and SI. Walker for ambulation     Vitals:   02/10/23 1410  BP: (!) 174/122  Pulse: (!) 105  Temp: 97.7 F (36.5 C)  TempSrc: Oral  SpO2: 98%  Weight: 115 lb (52.2 kg)  Height: 5\' 6"  (1.676 m)    Assessment & Plan:  Toradol 30 mg IM given at visit

## 2023-02-11 DIAGNOSIS — M5410 Radiculopathy, site unspecified: Secondary | ICD-10-CM | POA: Diagnosis not present

## 2023-02-11 MED ORDER — KETOROLAC TROMETHAMINE 30 MG/ML IJ SOLN
30.0000 mg | Freq: Once | INTRAMUSCULAR | Status: AC
Start: 2023-02-11 — End: 2023-02-11
  Administered 2023-02-11: 30 mg via INTRAMUSCULAR

## 2023-02-11 NOTE — Telephone Encounter (Signed)
Patient called and said DSS has not received any of the faxes sent. She would like for it to be faxed over again. She said she would call them to let them know. Best callback is [phone].

## 2023-02-11 NOTE — Telephone Encounter (Signed)
[name] states they still do not have the form. They are requesting it be faxed and emailed.   Fax: [phone] [email or web address]

## 2023-02-11 NOTE — Telephone Encounter (Signed)
I have fax forms over and emailed as well

## 2023-02-13 DIAGNOSIS — M5410 Radiculopathy, site unspecified: Secondary | ICD-10-CM | POA: Insufficient documentation

## 2023-02-13 NOTE — Assessment & Plan Note (Signed)
With fall and prior bilateral hip replacement. Prior x-ray immediately after normal. Repeating hip x-ray and adding lumbar x-ray to assess for injury causing severe pain. Given toradol 30 mg IM today. Using flexeril for pain at home and can continue that and meloxicam and tylenol.

## 2023-02-16 ENCOUNTER — Telehealth: Payer: Self-pay | Admitting: Radiology

## 2023-02-16 ENCOUNTER — Other Ambulatory Visit: Payer: Self-pay | Admitting: Internal Medicine

## 2023-02-16 DIAGNOSIS — I714 Abdominal aortic aneurysm, without rupture, unspecified: Secondary | ICD-10-CM

## 2023-02-16 DIAGNOSIS — M47817 Spondylosis without myelopathy or radiculopathy, lumbosacral region: Secondary | ICD-10-CM | POA: Diagnosis not present

## 2023-02-16 DIAGNOSIS — Z681 Body mass index (BMI) 19 or less, adult: Secondary | ICD-10-CM | POA: Diagnosis not present

## 2023-02-16 NOTE — Progress Notes (Deleted)
Patient name: [name] MRN: [id number] DOB: 06-Mar-1946 Sex: female  REASON FOR VISIT: 6 month follow-up for surveillance of abdominal aortic aneurysm and celiac artery aneurysm  HPI: [name] is a 77 y.o. 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  She was previously followed by Dr. Hart Rochester for a 2.7 cm AAA initially diagnosed in 2015.  Last seen in our clinic last year when her AAA was 4.6 cm cm and celiac aneurysm 1.59 cm last year.    She reports no significant changes to her health over the last year.  Still smoking 8 cigarettes a day.  No new abdominal pain.  Chronic back pain at baseline.  No new concerns today.  Her mother and grandmother died of AAA.   Past Medical History:  Diagnosis Date   AAA (abdominal aortic aneurysm) (HCC)    saw Dr Hart Rochester 11/2013.  2.7 cm   Arthritis    Chicken pox    COPD (chronic obstructive pulmonary disease) (HCC)    smokes 1/2ppd   DDD (degenerative disc disease), lumbar    GERD (gastroesophageal reflux disease)    diet controlled   History of blood transfusion    History of lumbar fusion 08/09/2014   Hyperlipidemia    IBS (irritable bowel syndrome)    diet contolled   Mild mitral regurgitation    RBBB    S/P arthroscopy of right shoulder 12/15/2017   Seasonal allergies    Varicose veins     Past Surgical History:  Procedure Laterality Date   APPENDECTOMY     BACK SURGERY N/A [phone]   5 back surgeries   BREAST BIOPSY  1989,05   lt br bx   BREAST EXCISIONAL BIOPSY Bilateral    CATARACT EXTRACTION W/ INTRAOCULAR LENS IMPLANT Bilateral 2018   CHOLECYSTECTOMY     EXCISION/RELEASE BURSA HIP Right 05/12/2013   Procedure: EXCISION RIGHT TROCHANTERIC BURSA/CALCIFICATION ;  Surgeon: Loreta Ave, MD;  Location: [facility];  Service: Orthopedics;  Laterality:  Right;   FASCIOTOMY Right 05/12/2013   Procedure: RIGHT FASCIOTOMY HIP ANY TYPE;  Surgeon: Loreta Ave, MD;  Location: [facility];  Service: Orthopedics;  Laterality: Right;   LARYNGOSCOPY AND BRONCHOSCOPY N/A 08/15/2019   Procedure: BRONCHOSCOPY/LARYNGOSCOPY;  Surgeon: Newman Pies, MD;  Location: [facility];  Service: ENT;  Laterality: N/A;   NM MYOCAR PERF WALL MOTION  04/13/2003   negative   SHOULDER ARTHROSCOPY WITH OPEN ROTATOR CUFF REPAIR Right 2018   skin cancer removed right leg     TONSILLECTOMY AND ADENOIDECTOMY  1950   TOTAL HIP ARTHROPLASTY Left 2006   lt total hip   TOTAL HIP ARTHROPLASTY Right 2011   TOTAL HIP ARTHROPLASTY Right 2016   second surgery due to recall   US ECHOCARDIOGRAPHY  04/13/2003   EF 65%, mild TR w/suggestion of mild pulmonary hypertension,thickened anterior leaflet,central mild MR.   VAGINAL HYSTERECTOMY  1993    Family History  Problem Relation Age of Onset   Early death Mother        aortic aneurysm   Vasculitis Mother        temporal arteritis   Aortic aneurysm Mother    Heart disease Mother        before age 77   Hyperlipidemia Mother    Hypertension Mother    COPD Sister  Hypertension Sister    Depression Sister    Heart disease Sister        before age 75   Post-traumatic stress disorder Sister    Bipolar disorder Sister    Brain cancer Maternal Aunt 61       brain   Heart disease Maternal Grandmother        aortic aneurysm   Aortic aneurysm Maternal Grandmother    Hypertension Sister    Throat cancer Maternal Grandfather    Breast cancer Neg Hx     SOCIAL HISTORY: Social History   Tobacco Use   Smoking status: Every Day    Packs/day: 0.75    Years: 30.00    Additional pack years: 0.00    Total pack years: 22.50    Types: Cigarettes   Smokeless tobacco: Never  Substance Use Topics   Alcohol use: Not Currently    Alcohol/week: 0.0 standard drinks of alcohol    Comment: very occasional,  not monthly    Allergies  Allergen Reactions   Bee Venom     Other reaction(s): Redness   Cephalexin Other (See Comments)    Headaches   Gabapentin Diarrhea    Current Outpatient Medications  Medication Sig Dispense Refill   atorvastatin (LIPITOR) 20 MG tablet Take 1 tablet (20 mg total) by mouth daily. 90 tablet 3   cyclobenzaprine (FLEXERIL) 5 MG tablet Take 1 tablet (5 mg total) by mouth 3 (three) times daily as needed for muscle spasms. 60 tablet 1   diazepam (VALIUM) 5 MG tablet TAKE ONE TABLET DAILY AS NEEDED FOR ANXIETY 30 tablet 1   HYDROcodone-acetaminophen (NORCO) 10-325 MG tablet Take 1 tablet by mouth 3 (three) times daily as needed for moderate pain.     ipratropium (ATROVENT) 0.06 % nasal spray Place 1-2 sprays into both nostrils in the morning and at bedtime.     levocetirizine (XYZAL) 5 MG tablet Take 5 mg by mouth at bedtime.     meloxicam (MOBIC) 15 MG tablet Take 1 tablet (15 mg total) by mouth daily. 30 tablet 0   montelukast (SINGULAIR) 10 MG tablet TAKE ONE TABLET AT BEDTIME 90 tablet 1   Multiple Vitamin (MULTIVITAMIN) tablet Take 1 tablet by mouth daily.     promethazine (PHENERGAN) 12.5 MG tablet TAKE ONE TABLET EVERY 8 HOURS AS NEEDED FOR NAUSEA AND VOMITING 20 tablet 0   vitamin B-12 (CYANOCOBALAMIN) 1000 MCG tablet Take 1 tablet (1,000 mcg total) by mouth daily. 100 tablet 2   No current facility-administered medications for this visit.    REVIEW OF SYSTEMS:  [X]  denotes positive finding, [ ]  denotes negative finding Cardiac  Comments:  Chest pain or chest pressure:    Shortness of breath upon exertion:    Short of breath when lying flat:    Irregular heart rhythm:        Vascular    Pain in calf, thigh, or hip brought on by ambulation:    Pain in feet at night that wakes you up from your sleep:     Blood clot in your veins:    Leg swelling:         Pulmonary    Oxygen at home:    Productive cough:     Wheezing:         Neurologic    Sudden  weakness in arms or legs:     Sudden numbness in arms or legs:     Sudden onset of difficulty speaking or slurred  speech:    Temporary loss of vision in one eye:     Problems with dizziness:         Gastrointestinal    Blood in stool:     Vomited blood:         Genitourinary    Burning when urinating:     Blood in urine:        Psychiatric    Major depression:         Hematologic    Bleeding problems:    Problems with blood clotting too easily:        Skin    Rashes or ulcers:        Constitutional    Fever or chills:      PHYSICAL EXAM: There were no vitals filed for this visit.   GENERAL: The patient is a well-nourished female, in no acute distress. The vital signs are documented above. CARDIAC: There is a regular rate and rhythm.  VASCULAR:  2+ femoral pulses palpable bilaterally Right DP and left PT palpable PULMONARY: No respiratory distress. ABDOMEN:   No pain with deep palpation of her abdomen and no pain with palpation of aneurysm. MUSCULOSKELETAL: There are no major deformities or cyanosis. NEUROLOGIC: No focal weakness or paresthesias are detected. PSYCHIATRIC: The patient has a normal affect.  DATA:   AAA duplex today suggest that her abdominal aortic aneurysm has grown from 4.26 cm to 4.6 cm over the past 6 months.  The celiac artery aneurysm no growth and last measured 1.76 cm and now 1.59 cm today.    Assessment/Plan:  77 year old female has been followed for known abdominal aortic aneurysm as well as small celiac artery aneurysm.  She presents for 6 month follow-up and interval surveillance today.  Discussed that her AAA duplex shows .  Her celiac aneurysm has not shown any growth measuring a maximal diameter of 1.59 cm today.  Discussed again in women we repair AAA's a greater than 5 cm.  Discussed we will repeat a AAA duplex in 6 months.  Again discussed the importance of smoking cessation and keeping her blood pressure well controlled.  Cephus Shelling, MD [facility] Office: [phone]  Cephus Shelling

## 2023-02-16 NOTE — Telephone Encounter (Signed)
Already addressed

## 2023-02-17 ENCOUNTER — Ambulatory Visit (HOSPITAL_COMMUNITY): Payer: 59

## 2023-02-17 ENCOUNTER — Ambulatory Visit: Payer: 59 | Admitting: Vascular Surgery

## 2023-03-03 DIAGNOSIS — M47817 Spondylosis without myelopathy or radiculopathy, lumbosacral region: Secondary | ICD-10-CM | POA: Diagnosis not present

## 2023-03-09 NOTE — Progress Notes (Unsigned)
Patient name: [name] MRN: [id number] DOB: 11-03-1945 Sex: female  REASON FOR VISIT: 6 month follow-up for surveillance of abdominal aortic aneurysm and celiac artery aneurysm  HPI: [name] is a 77 y.o. 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  She was previously followed by Dr. Hart Rochester for a 2.7 cm AAA initially diagnosed in 2015.  Last seen in our clinic last year when her AAA was 4.6 cm cm and celiac aneurysm 1.59 cm last year.    She reports no significant changes to her health over the last year.  Still smoking 8 cigarettes a day.  No new abdominal pain.  Chronic back pain at baseline.  No new concerns today.  Her mother and grandmother died of AAA.   Past Medical History:  Diagnosis Date   AAA (abdominal aortic aneurysm) (HCC)    saw Dr Hart Rochester 11/2013.  2.7 cm   Arthritis    Chicken pox    COPD (chronic obstructive pulmonary disease) (HCC)    smokes 1/2ppd   DDD (degenerative disc disease), lumbar    GERD (gastroesophageal reflux disease)    diet controlled   History of blood transfusion    History of lumbar fusion 08/09/2014   Hyperlipidemia    IBS (irritable bowel syndrome)    diet contolled   Mild mitral regurgitation    RBBB    S/P arthroscopy of right shoulder 12/15/2017   Seasonal allergies    Varicose veins     Past Surgical History:  Procedure Laterality Date   APPENDECTOMY     BACK SURGERY N/A [id number]   5 back surgeries   BREAST BIOPSY  1989,05   lt br bx   BREAST EXCISIONAL BIOPSY Bilateral    CATARACT EXTRACTION W/ INTRAOCULAR LENS IMPLANT Bilateral 2018   CHOLECYSTECTOMY     EXCISION/RELEASE BURSA HIP Right 05/12/2013   Procedure: EXCISION RIGHT TROCHANTERIC BURSA/CALCIFICATION ;  Surgeon: Loreta Ave, MD;  Location: [facility];  Service: Orthopedics;  Laterality:  Right;   FASCIOTOMY Right 05/12/2013   Procedure: RIGHT FASCIOTOMY HIP ANY TYPE;  Surgeon: Loreta Ave, MD;  Location: [facility];  Service: Orthopedics;  Laterality: Right;   LARYNGOSCOPY AND BRONCHOSCOPY N/A 08/15/2019   Procedure: BRONCHOSCOPY/LARYNGOSCOPY;  Surgeon: Newman Pies, MD;  Location: [facility];  Service: ENT;  Laterality: N/A;   NM MYOCAR PERF WALL MOTION  04/13/2003   negative   SHOULDER ARTHROSCOPY WITH OPEN ROTATOR CUFF REPAIR Right 2018   skin cancer removed right leg     TONSILLECTOMY AND ADENOIDECTOMY  1950   TOTAL HIP ARTHROPLASTY Left 2006   lt total hip   TOTAL HIP ARTHROPLASTY Right 2011   TOTAL HIP ARTHROPLASTY Right 2016   second surgery due to recall   US ECHOCARDIOGRAPHY  04/13/2003   EF 65%, mild TR w/suggestion of mild pulmonary hypertension,thickened anterior leaflet,central mild MR.   VAGINAL HYSTERECTOMY  1993    Family History  Problem Relation Age of Onset   Early death Mother        aortic aneurysm   Vasculitis Mother        temporal arteritis   Aortic aneurysm Mother    Heart disease Mother        before age 71   Hyperlipidemia Mother    Hypertension Mother    COPD Sister  Hypertension Sister    Depression Sister    Heart disease Sister        before age 75   Post-traumatic stress disorder Sister    Bipolar disorder Sister    Brain cancer Maternal Aunt 61       brain   Heart disease Maternal Grandmother        aortic aneurysm   Aortic aneurysm Maternal Grandmother    Hypertension Sister    Throat cancer Maternal Grandfather    Breast cancer Neg Hx     SOCIAL HISTORY: Social History   Tobacco Use   Smoking status: Every Day    Packs/day: 0.75    Years: 30.00    Additional pack years: 0.00    Total pack years: 22.50    Types: Cigarettes   Smokeless tobacco: Never  Substance Use Topics   Alcohol use: Not Currently    Alcohol/week: 0.0 standard drinks of alcohol    Comment: very occasional,  not monthly    Allergies  Allergen Reactions   Bee Venom     Other reaction(s): Redness   Cephalexin Other (See Comments)    Headaches   Gabapentin Diarrhea    Current Outpatient Medications  Medication Sig Dispense Refill   atorvastatin (LIPITOR) 20 MG tablet Take 1 tablet (20 mg total) by mouth daily. 90 tablet 3   cyclobenzaprine (FLEXERIL) 5 MG tablet Take 1 tablet (5 mg total) by mouth 3 (three) times daily as needed for muscle spasms. 60 tablet 1   diazepam (VALIUM) 5 MG tablet TAKE ONE TABLET DAILY AS NEEDED FOR ANXIETY 30 tablet 1   HYDROcodone-acetaminophen (NORCO) 10-325 MG tablet Take 1 tablet by mouth 3 (three) times daily as needed for moderate pain.     ipratropium (ATROVENT) 0.06 % nasal spray Place 1-2 sprays into both nostrils in the morning and at bedtime.     levocetirizine (XYZAL) 5 MG tablet Take 5 mg by mouth at bedtime.     meloxicam (MOBIC) 15 MG tablet Take 1 tablet (15 mg total) by mouth daily. 30 tablet 0   montelukast (SINGULAIR) 10 MG tablet TAKE ONE TABLET AT BEDTIME 90 tablet 1   Multiple Vitamin (MULTIVITAMIN) tablet Take 1 tablet by mouth daily.     promethazine (PHENERGAN) 12.5 MG tablet TAKE ONE TABLET EVERY 8 HOURS AS NEEDED FOR NAUSEA AND VOMITING 20 tablet 0   vitamin B-12 (CYANOCOBALAMIN) 1000 MCG tablet Take 1 tablet (1,000 mcg total) by mouth daily. 100 tablet 2   No current facility-administered medications for this visit.    REVIEW OF SYSTEMS:  [X]  denotes positive finding, [ ]  denotes negative finding Cardiac  Comments:  Chest pain or chest pressure:    Shortness of breath upon exertion:    Short of breath when lying flat:    Irregular heart rhythm:        Vascular    Pain in calf, thigh, or hip brought on by ambulation:    Pain in feet at night that wakes you up from your sleep:     Blood clot in your veins:    Leg swelling:         Pulmonary    Oxygen at home:    Productive cough:     Wheezing:         Neurologic    Sudden  weakness in arms or legs:     Sudden numbness in arms or legs:     Sudden onset of difficulty speaking or slurred  speech:    Temporary loss of vision in one eye:     Problems with dizziness:         Gastrointestinal    Blood in stool:     Vomited blood:         Genitourinary    Burning when urinating:     Blood in urine:        Psychiatric    Major depression:         Hematologic    Bleeding problems:    Problems with blood clotting too easily:        Skin    Rashes or ulcers:        Constitutional    Fever or chills:      PHYSICAL EXAM: There were no vitals filed for this visit.   GENERAL: The patient is a well-nourished female, in no acute distress. The vital signs are documented above. CARDIAC: There is a regular rate and rhythm.  VASCULAR:  2+ femoral pulses palpable bilaterally Right DP and left PT palpable PULMONARY: No respiratory distress. ABDOMEN:   No pain with deep palpation of her abdomen and no pain with palpation of aneurysm. MUSCULOSKELETAL: There are no major deformities or cyanosis. NEUROLOGIC: No focal weakness or paresthesias are detected. PSYCHIATRIC: The patient has a normal affect.  DATA:   AAA duplex today suggest   Assessment/Plan:  77 year old female has been followed for known abdominal aortic aneurysm as well as small celiac artery aneurysm.  She presents for 6 month follow-up and interval surveillance today.  Discussed that her AAA duplex shows .  Her celiac aneurysm has not shown any growth measuring a maximal diameter of 1.59 cm today.  Discussed again in women we repair AAA's a greater than 5 cm.  Discussed we will repeat a AAA duplex in 6 months.  Again discussed the importance of smoking cessation and keeping her blood pressure well controlled.  Cephus Shelling, MD [facility] Office: [phone]  Cephus Shelling

## 2023-03-10 ENCOUNTER — Ambulatory Visit (HOSPITAL_COMMUNITY)
Admission: RE | Admit: 2023-03-10 | Discharge: 2023-03-10 | Disposition: A | Discharge status: Home / Self Care | Payer: 59 | Source: Ambulatory Visit | Attending: Vascular Surgery | Admitting: Vascular Surgery

## 2023-03-10 ENCOUNTER — Encounter: Payer: Self-pay | Admitting: Vascular Surgery

## 2023-03-10 ENCOUNTER — Ambulatory Visit (INDEPENDENT_AMBULATORY_CARE_PROVIDER_SITE_OTHER): Payer: 59 | Admitting: Vascular Surgery

## 2023-03-10 VITALS — BP 155/95 | HR 84 | Temp 97.8°F | Resp 14 | Ht 66.0 in | Wt 110.0 lb

## 2023-03-10 DIAGNOSIS — I7143 Infrarenal abdominal aortic aneurysm, without rupture: Secondary | ICD-10-CM

## 2023-03-10 DIAGNOSIS — I714 Abdominal aortic aneurysm, without rupture, unspecified: Secondary | ICD-10-CM | POA: Diagnosis not present

## 2023-03-17 ENCOUNTER — Ambulatory Visit: Payer: Medicare Other | Admitting: Internal Medicine

## 2023-03-19 ENCOUNTER — Ambulatory Visit
Admission: RE | Admit: 2023-03-19 | Discharge: 2023-03-19 | Disposition: A | Discharge status: Home / Self Care | Payer: 59 | Source: Ambulatory Visit | Attending: Internal Medicine | Admitting: Internal Medicine

## 2023-03-19 DIAGNOSIS — I700 Atherosclerosis of aorta: Secondary | ICD-10-CM | POA: Diagnosis not present

## 2023-03-19 DIAGNOSIS — I714 Abdominal aortic aneurysm, without rupture, unspecified: Secondary | ICD-10-CM

## 2023-03-19 DIAGNOSIS — J439 Emphysema, unspecified: Secondary | ICD-10-CM | POA: Diagnosis not present

## 2023-03-19 MED ORDER — IOPAMIDOL (ISOVUE-370) INJECTION 76%
95.0000 mL | Freq: Once | INTRAVENOUS | Status: AC | PRN
  Administered 2023-03-19: 95 mL via INTRAVENOUS

## 2023-03-23 ENCOUNTER — Encounter: Payer: Self-pay | Admitting: Internal Medicine

## 2023-03-23 ENCOUNTER — Ambulatory Visit (INDEPENDENT_AMBULATORY_CARE_PROVIDER_SITE_OTHER): Payer: 59 | Admitting: Internal Medicine

## 2023-03-23 VITALS — BP 140/80 | HR 99 | Temp 98.6°F | Ht 66.0 in | Wt 108.0 lb

## 2023-03-23 DIAGNOSIS — E538 Deficiency of other specified B group vitamins: Secondary | ICD-10-CM | POA: Diagnosis not present

## 2023-03-23 DIAGNOSIS — I700 Atherosclerosis of aorta: Secondary | ICD-10-CM | POA: Diagnosis not present

## 2023-03-23 DIAGNOSIS — G894 Chronic pain syndrome: Secondary | ICD-10-CM

## 2023-03-23 DIAGNOSIS — I7143 Infrarenal abdominal aortic aneurysm, without rupture: Secondary | ICD-10-CM

## 2023-03-23 DIAGNOSIS — Z720 Tobacco use: Secondary | ICD-10-CM | POA: Diagnosis not present

## 2023-03-23 DIAGNOSIS — F1120 Opioid dependence, uncomplicated: Secondary | ICD-10-CM

## 2023-03-23 DIAGNOSIS — Z0000 Encounter for general adult medical examination without abnormal findings: Secondary | ICD-10-CM | POA: Diagnosis not present

## 2023-03-23 DIAGNOSIS — E782 Mixed hyperlipidemia: Secondary | ICD-10-CM

## 2023-03-23 MED ORDER — AMLODIPINE BESYLATE 5 MG PO TABS: 5.0000 mg | ORAL_TABLET | Freq: Every day | ORAL | 3 refills | Status: DC

## 2023-03-23 MED ORDER — CYCLOBENZAPRINE HCL 5 MG PO TABS: 5.0000 mg | ORAL_TABLET | Freq: Three times a day (TID) | ORAL | 3 refills | Status: DC | PRN

## 2023-03-23 MED ORDER — DIAZEPAM 5 MG PO TABS: ORAL_TABLET | ORAL | 1 refills | Status: DC

## 2023-03-23 NOTE — Assessment & Plan Note (Addendum)
Most recent 5.2 cm and will be getting fixed in the near future. Already follows with vascular surgery. We have added amlodipine 5 mg daily due to high BP recently. Advised to quit smoking.

## 2023-03-23 NOTE — Assessment & Plan Note (Signed)
Taking B12 1000 mcg daily and well controlled.

## 2023-03-23 NOTE — Assessment & Plan Note (Signed)
Advised to quit and she is unable to make attempt.

## 2023-03-23 NOTE — Assessment & Plan Note (Signed)
Offered lipid panel to assess and she will wait for now.

## 2023-03-23 NOTE — Assessment & Plan Note (Signed)
Continue statin daily.  

## 2023-03-23 NOTE — Assessment & Plan Note (Signed)
Still using hydrocodone chronically. We do prescribe valium for her and she does not use this regularly and knows not to take within 4 hours of her hydrocodone.

## 2023-03-23 NOTE — Progress Notes (Signed)
   Subjective:   Patient ID: [name], female    DOB: 06/24/1946, 77 y.o.   MRN: [id number]  HPI The patient is here for physical. BP running high at home lately.   PMH, [facility] social history reviewed and updated  Review of Systems  Constitutional: Negative.   HENT: Negative.    Eyes: Negative.   Respiratory:  Negative for cough, chest tightness and shortness of breath.   Cardiovascular:  Negative for chest pain, palpitations and leg swelling.  Gastrointestinal:  Negative for abdominal distention, abdominal pain, constipation, diarrhea, nausea and vomiting.  Musculoskeletal:  Positive for back pain.  Skin: Negative.   Neurological: Negative.   Psychiatric/Behavioral: Negative.      Objective:  Physical Exam Constitutional:      Appearance: She is well-developed.  HENT:     Head: Normocephalic and atraumatic.  Cardiovascular:     Rate and Rhythm: Normal rate and regular rhythm.  Pulmonary:     Effort: Pulmonary effort is normal. No respiratory distress.     Breath sounds: Normal breath sounds. No wheezing or rales.  Abdominal:     General: Bowel sounds are normal. There is no distension.     Palpations: Abdomen is soft.     Tenderness: There is no abdominal tenderness. There is no rebound.  Musculoskeletal:        General: Tenderness present.     Cervical back: Normal range of motion.  Skin:    General: Skin is warm and dry.  Neurological:     Mental Status: She is alert and oriented to person, place, and time.     Coordination: Coordination normal.     Vitals:   03/23/23 1302  BP: (!) 140/100  Pulse: 99  Temp: 98.6 F (37 C)  TempSrc: Oral  SpO2: 91%  Weight: 108 lb (49 kg)  Height: 5\' 6"  (1.676 m)    Assessment & Plan:

## 2023-03-23 NOTE — Assessment & Plan Note (Signed)
Taking chronic opioids and she is aware of risk/benefit of this.

## 2023-03-23 NOTE — Patient Instructions (Signed)
We will start amlodipine for blood pressure take 1 pill daily.

## 2023-03-23 NOTE — Assessment & Plan Note (Signed)
Flu shot yearly. Pneumonia complete. Shingrix complete. Tetanus due at pharmacy. Colonoscopy aged out. Mammogram aged out, pap smear aged out and dexa up to date. Counseled about sun safety and mole surveillance. Counseled about the dangers of distracted driving. Given 10 year screening recommendations.

## 2023-04-06 NOTE — Progress Notes (Unsigned)
Patient name: [name] MRN: [id number] DOB: 02-03-1946 Sex: female  REASON FOR VISIT: Follow-up after CTA for evaluation of 5 cm abdominal aortic aneurysm  HPI: [name] is a 77 y.o. female with history of hyperlipidemia as well as tobacco abuse that presents for follow-up after CTA of her abdominal aortic aneurysm.  She has been followed for a abdominal and celiac artery aneurysm by Dr. Hart Rochester.  On the last visit on 03/10/2019 for her AAA had increased to 5.05 cm.   Cut back on smoking and down to 4-5 cigarettes a day.  No new abdominal pain.  Chronic back pain at baseline.  No new concerns today.  Her mother and grandmother died of AAA.   Past Medical History:  Diagnosis Date   AAA (abdominal aortic aneurysm) (HCC)    saw Dr Hart Rochester 11/2013.  2.7 cm   Arthritis    Chicken pox    COPD (chronic obstructive pulmonary disease) (HCC)    smokes 1/2ppd   DDD (degenerative disc disease), lumbar    GERD (gastroesophageal reflux disease)    diet controlled   History of blood transfusion    History of lumbar fusion 08/09/2014   Hyperlipidemia    IBS (irritable bowel syndrome)    diet contolled   Mild mitral regurgitation    RBBB    S/P arthroscopy of right shoulder 12/15/2017   Seasonal allergies    Varicose veins     Past Surgical History:  Procedure Laterality Date   APPENDECTOMY     BACK SURGERY N/A [phone]   5 back surgeries   BREAST BIOPSY  1989,05   lt br bx   BREAST EXCISIONAL BIOPSY Bilateral    CATARACT EXTRACTION W/ INTRAOCULAR LENS IMPLANT Bilateral 2018   CHOLECYSTECTOMY     EXCISION/RELEASE BURSA HIP Right 05/12/2013   Procedure: EXCISION RIGHT TROCHANTERIC BURSA/CALCIFICATION ;  Surgeon: Loreta Ave, MD;  Location: [facility];  Service: Orthopedics;  Laterality: Right;   FASCIOTOMY Right 05/12/2013   Procedure: RIGHT FASCIOTOMY HIP ANY TYPE;  Surgeon: Loreta Ave, MD;  Location: [facility];  Service: Orthopedics;   Laterality: Right;   LARYNGOSCOPY AND BRONCHOSCOPY N/A 08/15/2019   Procedure: BRONCHOSCOPY/LARYNGOSCOPY;  Surgeon: Newman Pies, MD;  Location: [facility];  Service: ENT;  Laterality: N/A;   NM MYOCAR PERF WALL MOTION  04/13/2003   negative   SHOULDER ARTHROSCOPY WITH OPEN ROTATOR CUFF REPAIR Right 2018   skin cancer removed right leg     TONSILLECTOMY AND ADENOIDECTOMY  1950   TOTAL HIP ARTHROPLASTY Left 2006   lt total hip   TOTAL HIP ARTHROPLASTY Right 2011   TOTAL HIP ARTHROPLASTY Right 2016   second surgery due to recall   US ECHOCARDIOGRAPHY  04/13/2003   EF 65%, mild TR w/suggestion of mild pulmonary hypertension,thickened anterior leaflet,central mild MR.   VAGINAL HYSTERECTOMY  1993    Family History  Problem Relation Age of Onset   Early death Mother        aortic aneurysm   Vasculitis Mother        temporal arteritis   Aortic aneurysm Mother    Heart disease Mother        before age 66   Hyperlipidemia Mother    Hypertension Mother    COPD Sister    Hypertension Sister    Depression Sister    Heart disease Sister        before age 12   Post-traumatic  stress disorder Sister    Bipolar disorder Sister    Brain cancer Maternal Aunt 77       brain   Heart disease Maternal Grandmother        aortic aneurysm   Aortic aneurysm Maternal Grandmother    Hypertension Sister    Throat cancer Maternal Grandfather    Breast cancer Neg Hx     SOCIAL HISTORY: Social History   Tobacco Use   Smoking status: Every Day    Packs/day: 0.75    Years: 30.00    Additional pack years: 0.00    Total pack years: 22.50    Types: Cigarettes   Smokeless tobacco: Never  Substance Use Topics   Alcohol use: Not Currently    Alcohol/week: 0.0 standard drinks of alcohol    Comment: very occasional, not monthly    Allergies  Allergen Reactions   Bee Venom     Other reaction(s): Redness   Cephalexin Other (See Comments)    Headaches   Gabapentin Diarrhea     Current Outpatient Medications  Medication Sig Dispense Refill   amLODipine (NORVASC) 5 MG tablet Take 1 tablet (5 mg total) by mouth daily. 90 tablet 3   atorvastatin (LIPITOR) 20 MG tablet Take 1 tablet (20 mg total) by mouth daily. 90 tablet 3   cyclobenzaprine (FLEXERIL) 5 MG tablet Take 1 tablet (5 mg total) by mouth 3 (three) times daily as needed for muscle spasms. 180 tablet 3   diazepam (VALIUM) 5 MG tablet TAKE ONE TABLET DAILY AS NEEDED FOR ANXIETY 30 tablet 1   HYDROcodone-acetaminophen (NORCO) 10-325 MG tablet Take 1 tablet by mouth 3 (three) times daily as needed for moderate pain.     ipratropium (ATROVENT) 0.06 % nasal spray Place 1-2 sprays into both nostrils in the morning and at bedtime.     levocetirizine (XYZAL) 5 MG tablet Take 5 mg by mouth at bedtime.     meloxicam (MOBIC) 15 MG tablet Take 1 tablet (15 mg total) by mouth daily. 30 tablet 0   montelukast (SINGULAIR) 10 MG tablet TAKE ONE TABLET AT BEDTIME 90 tablet 1   Multiple Vitamin (MULTIVITAMIN) tablet Take 1 tablet by mouth daily.     promethazine (PHENERGAN) 12.5 MG tablet TAKE ONE TABLET EVERY 8 HOURS AS NEEDED FOR NAUSEA AND VOMITING 20 tablet 0   vitamin B-12 (CYANOCOBALAMIN) 1000 MCG tablet Take 1 tablet (1,000 mcg total) by mouth daily. 100 tablet 2   No current facility-administered medications for this visit.    REVIEW OF SYSTEMS:  [X]  denotes positive finding, [ ]  denotes negative finding Cardiac  Comments:  Chest pain or chest pressure:    Shortness of breath upon exertion:    Short of breath when lying flat:    Irregular heart rhythm:        Vascular    Pain in calf, thigh, or hip brought on by ambulation:    Pain in feet at night that wakes you up from your sleep:     Blood clot in your veins:    Leg swelling:         Pulmonary    Oxygen at home:    Productive cough:     Wheezing:         Neurologic    Sudden weakness in arms or legs:     Sudden numbness in arms or legs:      Sudden onset of difficulty speaking or slurred speech:    Temporary loss of  vision in one eye:     Problems with dizziness:         Gastrointestinal    Blood in stool:     Vomited blood:         Genitourinary    Burning when urinating:     Blood in urine:        Psychiatric    Major depression:         Hematologic    Bleeding problems:    Problems with blood clotting too easily:        Skin    Rashes or ulcers:        Constitutional    Fever or chills:      PHYSICAL EXAM: There were no vitals filed for this visit.   GENERAL: The patient is a well-nourished female, in no acute distress. The vital signs are documented above. CARDIAC: There is a regular rate and rhythm.  VASCULAR:  2+ femoral pulses palpable bilaterally Right DP and left PT palpable PULMONARY: No respiratory distress. ABDOMEN:   No pain with deep palpation of her abdomen and no pain with palpation of aneurysm. MUSCULOSKELETAL: There are no major deformities or cyanosis. NEUROLOGIC: No focal weakness or paresthesias are detected. PSYCHIATRIC: The patient has a normal affect.  DATA:   CTA reviewed from 03/19/2023 showing a 5 cm abdominal aortic aneurysm  Assessment/Plan:  77 year old female has been followed for known abdominal aortic aneurysm as well as small celiac artery aneurysm.  Discussed that her CTA confirms a greater than 5 cm abdominal aortic aneurysm.  On I reviewed her images and I measured this at 5.2 cm.  I recommended stent graft repair given risk of rupture.  I discussed in women we repaired these greater than 5 cm.  We have recommended bilateral transfemoral access in the OR with aortobiiliac stent graft.  Cephus Shelling, MD [facility] [place] Office: [phone]  Cephus Shelling

## 2023-04-07 ENCOUNTER — Encounter: Payer: Self-pay | Admitting: Vascular Surgery

## 2023-04-07 ENCOUNTER — Other Ambulatory Visit (HOSPITAL_COMMUNITY): Payer: 59

## 2023-04-07 ENCOUNTER — Ambulatory Visit (INDEPENDENT_AMBULATORY_CARE_PROVIDER_SITE_OTHER): Payer: 59 | Admitting: Vascular Surgery

## 2023-04-07 ENCOUNTER — Ambulatory Visit: Payer: 59 | Admitting: Vascular Surgery

## 2023-04-07 VITALS — BP 132/91 | HR 117 | Temp 97.9°F | Resp 14 | Ht 66.0 in | Wt 106.5 lb

## 2023-04-07 DIAGNOSIS — I7143 Infrarenal abdominal aortic aneurysm, without rupture: Secondary | ICD-10-CM

## 2023-04-08 ENCOUNTER — Other Ambulatory Visit: Payer: Self-pay

## 2023-04-08 DIAGNOSIS — I7143 Infrarenal abdominal aortic aneurysm, without rupture: Secondary | ICD-10-CM

## 2023-04-13 NOTE — Pre-Procedure Instructions (Signed)
Surgical Instructions   Your procedure is scheduled on April 20, 2023. Report to [facility] Main Entrance "A" at 5:30 A.M., then check in with the Admitting office. Any questions or running late day of surgery: call [phone]  Questions prior to your surgery date: call [phone], Monday-Friday, 8am-4pm. If you experience any cold or flu symptoms such as cough, fever, chills, shortness of breath, etc. between now and your scheduled surgery, please notify us at the above number.     Remember:  Do not eat or drink after midnight the night before your surgery     Take these medicines the morning of surgery with A SIP OF WATER:  amLODipine (NORVASC)    atorvastatin (LIPITOR)     May take these medicines IF NEEDED:  cyclobenzaprine (FLEXERIL)   diazepam (VALIUM)   HYDROcodone-acetaminophen (NORCO)   levocetirizine (XYZAL)   promethazine (PHENERGAN)     One week prior to surgery, STOP taking any Aspirin (unless otherwise instructed by your surgeon) Aleve, Naproxen, Ibuprofen, Motrin, Advil, Goody's, BC's, all herbal medications, fish oil, and non-prescription vitamins. This includes your medication: meloxicam (MOBIC)                      Do NOT Smoke (Tobacco/Vaping) for 24 hours prior to your procedure.  If you use a CPAP at night, you may bring your mask/headgear for your overnight stay.   You will be asked to remove any contacts, glasses, piercing's, hearing aid's, dentures/partials prior to surgery. Please bring cases for these items if needed.    Patients discharged the day of surgery will not be allowed to drive home, and someone needs to stay with them for 24 hours.  SURGICAL WAITING ROOM VISITATION Patients may have no more than 2 support people in the waiting area - these visitors may rotate.   Pre-op nurse will coordinate an appropriate time for 1 ADULT support person, who may not rotate, to accompany patient in pre-op.  Children under the age of 31 must have an  adult with them who is not the patient and must remain in the main waiting area with an adult.  If the patient needs to stay at the hospital during part of their recovery, the visitor guidelines for inpatient rooms apply.  Please refer to the [facility] website for the visitor guidelines for any additional information.   If you received a COVID test during your pre-op visit  it is requested that you wear a mask when out in public, stay away from anyone that may not be feeling well and notify your surgeon if you develop symptoms. If you have been in contact with anyone that has tested positive in the last 10 days please notify you surgeon.      Pre-operative CHG Bathing Instructions   You can play a key role in reducing the risk of infection after surgery. Your skin needs to be as free of germs as possible. You can reduce the number of germs on your skin by washing with CHG (chlorhexidine gluconate) soap before surgery. CHG is an antiseptic soap that kills germs and continues to kill germs even after washing.   DO NOT use if you have an allergy to chlorhexidine/CHG or antibacterial soaps. If your skin becomes reddened or irritated, stop using the CHG and notify one of our RNs at [phone].                 TAKE A SHOWER THE NIGHT BEFORE SURGERY AND THE  DAY OF SURGERY    Please keep in mind the following:  DO NOT shave, including legs and underarms, 48 hours prior to surgery.   You may shave your face before/day of surgery.  Place clean sheets on your bed the night before surgery Use a clean washcloth (not used since being washed) for each shower. DO NOT sleep with pet's night before surgery.  CHG Shower Instructions:  If you choose to wash your hair and private area, wash first with your normal shampoo/soap.  After you use shampoo/soap, rinse your hair and body thoroughly to remove shampoo/soap residue.  Turn the water OFF and apply half the bottle of CHG soap to a CLEAN washcloth.   Apply CHG soap ONLY FROM YOUR NECK DOWN TO YOUR TOES (washing for 3-5 minutes)  DO NOT use CHG soap on face, private areas, open wounds, or sores.  Pay special attention to the area where your surgery is being performed.  If you are having back surgery, having someone wash your back for you may be helpful. Wait 2 minutes after CHG soap is applied, then you may rinse off the CHG soap.  Pat dry with a clean towel  Put on clean clothes/pajamas    Additional instructions for the day of surgery: DO NOT APPLY any lotions, deodorants, cologne, or perfumes.   Do not wear jewelry or makeup Do not wear nail polish, gel polish, artificial nails, or any other type of covering on natural nails (fingers and toes) Do not bring valuables to the hospital. [facility] is not responsible for valuables/personal belongings. Put on clean/comfortable clothes.  Please brush your teeth.  Ask your nurse before applying any prescription medications to the skin.

## 2023-04-14 ENCOUNTER — Telehealth: Payer: Self-pay | Admitting: Internal Medicine

## 2023-04-14 ENCOUNTER — Encounter (HOSPITAL_COMMUNITY): Payer: Self-pay

## 2023-04-14 ENCOUNTER — Other Ambulatory Visit: Payer: Self-pay

## 2023-04-14 ENCOUNTER — Encounter (HOSPITAL_COMMUNITY)
Admission: RE | Admit: 2023-04-14 | Discharge: 2023-04-14 | Disposition: A | Discharge status: Home / Self Care | Payer: 59 | Source: Ambulatory Visit | Attending: Vascular Surgery | Admitting: Vascular Surgery

## 2023-04-14 ENCOUNTER — Telehealth: Payer: Self-pay

## 2023-04-14 VITALS — BP 146/104 | HR 117 | Temp 98.2°F | Resp 16 | Ht 66.0 in | Wt 108.5 lb

## 2023-04-14 DIAGNOSIS — Z7901 Long term (current) use of anticoagulants: Secondary | ICD-10-CM | POA: Insufficient documentation

## 2023-04-14 DIAGNOSIS — Z01818 Encounter for other preprocedural examination: Secondary | ICD-10-CM | POA: Diagnosis not present

## 2023-04-14 DIAGNOSIS — I7143 Infrarenal abdominal aortic aneurysm, without rupture: Secondary | ICD-10-CM | POA: Diagnosis not present

## 2023-04-14 HISTORY — DX: Headache, unspecified: R51.9

## 2023-04-14 HISTORY — DX: Peripheral vascular disease, unspecified: I73.9

## 2023-04-14 LAB — URINALYSIS, ROUTINE W REFLEX MICROSCOPIC
Bacteria, UA: NONE SEEN
Bilirubin Urine: NEGATIVE
Glucose, UA: NEGATIVE mg/dL
Hgb urine dipstick: NEGATIVE
Ketones, ur: NEGATIVE mg/dL
Nitrite: NEGATIVE
Protein, ur: NEGATIVE mg/dL
Specific Gravity, Urine: 1.008 (ref 1.005–1.030)
pH: 7 (ref 5.0–8.0)

## 2023-04-14 LAB — COMPREHENSIVE METABOLIC PANEL
ALT: 19 U/L (ref 0–44)
AST: 26 U/L (ref 15–41)
Albumin: 4.2 g/dL (ref 3.5–5.0)
Alkaline Phosphatase: 98 U/L (ref 38–126)
Anion gap: 9 (ref 5–15)
BUN: 11 mg/dL (ref 8–23)
CO2: 28 mmol/L (ref 22–32)
Calcium: 9.1 mg/dL (ref 8.9–10.3)
Chloride: 99 mmol/L (ref 98–111)
Creatinine, Ser: 0.75 mg/dL (ref 0.44–1.00)
GFR, Estimated: >60 mL/min (ref >60)
Glucose, Bld: 96 mg/dL (ref 70–99)
Potassium: 3.6 mmol/L (ref 3.5–5.1)
Sodium: 136 mmol/L (ref 135–145)
Total Bilirubin: 0.7 mg/dL (ref 0.3–1.2)
Total Protein: 7.5 g/dL (ref 6.5–8.1)

## 2023-04-14 LAB — TYPE AND SCREEN
ABO/RH(D): O POS
Antibody Screen: NEGATIVE

## 2023-04-14 LAB — CBC
HCT: 45.3 % (ref 36.0–46.0)
Hemoglobin: 15.2 g/dL — ABNORMAL HIGH (ref 12.0–15.0)
MCH: 33.4 pg (ref 26.0–34.0)
MCHC: 33.6 g/dL (ref 30.0–36.0)
MCV: 99.6 fL (ref 80.0–100.0)
Platelets: 227 10*3/uL (ref 150–400)
RBC: 4.55 MIL/uL (ref 3.87–5.11)
RDW: 14.4 % (ref 11.5–15.5)
WBC: 9.8 10*3/uL (ref 4.0–10.5)
nRBC: 0 % (ref 0.0–0.2)

## 2023-04-14 LAB — SURGICAL PCR SCREEN
MRSA, PCR: NEGATIVE
Staphylococcus aureus: NEGATIVE

## 2023-04-14 LAB — PROTIME-INR
INR: 1 (ref 0.8–1.2)
Prothrombin Time: 13 seconds (ref 11.4–15.2)

## 2023-04-14 LAB — APTT: aPTT: 34 seconds (ref 24–36)

## 2023-04-14 MED ORDER — SULFAMETHOXAZOLE-TRIMETHOPRIM 800-160 MG PO TABS: 1.0000 | ORAL_TABLET | Freq: Two times a day (BID) | ORAL | 0 refills | Status: AC

## 2023-04-14 NOTE — Telephone Encounter (Signed)
I cannot say this, as I can't be sure her cuff at home is accurate.  She can bring her cuff to office for nurse visit for BP check if she likes.

## 2023-04-14 NOTE — Telephone Encounter (Signed)
 Received notification from preadmission testing regarding patient's abnormal pre-op urinalysis results. Spoke with patient regarding urinalysis results and will send a Rx for Bactrim DS, take 1 tablet by mouth twice daily x 5 days to pharmacy. Patient verbalized understanding.

## 2023-04-14 NOTE — Telephone Encounter (Signed)
Spoke with patient over the phone and she wanted to know if she can take two 5 mg for her BP medicine she wants her BP to be stable before her surgery. Surgery is on the 22nd. Please advise

## 2023-04-14 NOTE — Telephone Encounter (Signed)
-----   Message from Cephus Shelling sent at 04/14/2023  4:11 PM EDT ----- Regarding: RE: Abnormal lab Can you treat her just to be safe.  Thanks,  Thayer Ohm ----- Message ----- From: Primitivo Gauze, RN Sent: 04/14/2023   4:07 PM EDT To: Cephus Shelling, MD Subject: Abnormal lab                                   This patients UA showed trace Leukocytes. She is scheduled for an EVAR on 7/22.   Thanks,  Yahoo

## 2023-04-14 NOTE — Telephone Encounter (Signed)
Patient called and stated that her blood pressure was 130/110.  She wanted to changed her blood pressure medication and I told her that she would need to be seen.  Patient said no thank you and hung up on me.

## 2023-04-14 NOTE — Progress Notes (Signed)
PCP - Dr. Hillard Danker Cardiologist - Denies (Pt has been following with Vascular since around 2016 monitoring AAA)  PPM/ICD - Denies Device Orders - n/a Rep Notified - n/a  Chest x-ray - Denies EKG - 05/22/2022 Stress Test - 04/13/2003 ECHO - 02/27/2022 Cardiac Cath - Denies  Sleep Study - Denies CPAP - n/a  No DM  Last dose of GLP1 agonist- n/a GLP1 instructions: n/a  Blood Thinner Instructions: n/a Aspirin Instructions: n/a  NPO after midnight  COVID TEST- n/a   Anesthesia review: Yes. Pts BP at appointment 140/104. PT states it has been high for around 6 weeks. Manual BP taken and was 160/106. Discussed with Antionette Poles, PA-C. Pt does have a BP machine at home. She is to check her BP tonight and tomorrow morning. If the diastolic number on both of those readings is above 100, she is instructed to call her PCP to discuss. If they are okay, she still needs to continue to check her BP at least twice per day.  Patient denies shortness of breath, fever, cough and chest pain at PAT appointment. Pt denies any respiratory illness/infection in the last two months.   All instructions explained to the patient, with a verbal understanding of the material. Patient agrees to go over the instructions while at home for a better understanding. Patient also instructed to self quarantine after being tested for COVID-19. The opportunity to ask questions was provided.

## 2023-04-15 ENCOUNTER — Other Ambulatory Visit: Payer: Self-pay | Admitting: Internal Medicine

## 2023-04-15 DIAGNOSIS — J302 Other seasonal allergic rhinitis: Secondary | ICD-10-CM

## 2023-04-15 NOTE — Telephone Encounter (Signed)
Patient denied coming in office after MD advice and stated she Spoke another doctor Mr Chestine Spore and they approved her to double up her dosage

## 2023-04-19 NOTE — Anesthesia Preprocedure Evaluation (Signed)
Anesthesia Evaluation  Patient identified by MRN, date of birth, ID band Patient awake    Reviewed: Allergy & Precautions, H&P , NPO status , Patient's Chart, lab work & pertinent test results  Airway Mallampati: II  TM Distance: >3 FB Neck ROM: Full    Dental  (+) Dental Advisory Given, Missing, Chipped   Pulmonary COPD,  COPD inhaler, Current Smoker and Patient abstained from smoking.   Pulmonary exam normal breath sounds clear to auscultation       Cardiovascular pulmonary hypertension+ Peripheral Vascular Disease  + Valvular Problems/Murmurs MR  Rhythm:Regular Rate:Normal + Systolic murmurs Echo 02/2022  1. Left ventricular ejection fraction, by estimation, is 60 to 65%. The left ventricle has normal function. The left ventricle has no regional wall motion abnormalities. Left ventricular diastolic parameters are consistent with Grade I diastolic dysfunction (impaired relaxation).   2. Right ventricular systolic function is normal. The right ventricular size is normal. There is mildly elevated pulmonary artery systolic pressure. The estimated right ventricular systolic pressure is 36.57mmHg.   3. The mitral valve is normal in structure. Mild to moderate mitral valve regurgitation. No evidence of mitral stenosis. Moderate mitral annular calcification.   4. The aortic valve is calcified. Aortic valve regurgitation is not visualized. Aortic valve sclerosis/calcification is present, without any evidence of aortic stenosis. Aortic valve mean gradient measures 10.22mmHg. Aortic valve Vmax measures 2.09 m/s.   5. The inferior vena cava is normal in size with greater than 50% respiratory variability, suggesting right atrial pressure of 3 mmHg.   Comparison(s): Report only from [facility] H&V 04/13/2003 EF 65%.     Neuro/Psych  Headaches  Neuromuscular disease    GI/Hepatic Neg liver ROS,GERD  ,,  Endo/Other  negative endocrine ROS     Renal/GU negative Renal ROS     Musculoskeletal  (+) Arthritis ,    Abdominal  (+) - obese  Peds  Hematology   Anesthesia Other Findings   Reproductive/Obstetrics                             Anesthesia Physical Anesthesia Plan  ASA: 4  Anesthesia Plan: General   Post-op Pain Management: Tylenol PO (pre-op)*   Induction: Intravenous  PONV Risk Score and Plan: 2 and Ondansetron, Midazolam, Treatment may vary due to age or medical condition and Dexamethasone  Airway Management Planned: Oral ETT  Additional Equipment: Arterial line  Intra-op Plan:   Post-operative Plan: Extubation in OR  Informed Consent: I have reviewed the patients History and Physical, chart, labs and discussed the procedure including the risks, benefits and alternatives for the proposed anesthesia with the patient or authorized representative who has indicated his/her understanding and acceptance.     Dental advisory given  Plan Discussed with: CRNA  Anesthesia Plan Comments: (2 x PIV vs CVL)        Anesthesia Quick Evaluation

## 2023-04-20 ENCOUNTER — Inpatient Hospital Stay (HOSPITAL_COMMUNITY)
Admission: RE | Admit: 2023-04-20 | Discharge: 2023-04-21 | DRG: 269 | Disposition: A | Discharge status: Home / Self Care | Payer: 59 | Attending: Vascular Surgery | Admitting: Vascular Surgery

## 2023-04-20 ENCOUNTER — Other Ambulatory Visit: Payer: Self-pay

## 2023-04-20 ENCOUNTER — Inpatient Hospital Stay (HOSPITAL_COMMUNITY): Payer: 59

## 2023-04-20 ENCOUNTER — Encounter (HOSPITAL_COMMUNITY): Payer: Self-pay | Admitting: Vascular Surgery

## 2023-04-20 ENCOUNTER — Encounter (HOSPITAL_COMMUNITY)
Admission: RE | Disposition: A | Discharge status: Home / Self Care | Payer: Self-pay | Source: Home / Self Care | Attending: Vascular Surgery

## 2023-04-20 ENCOUNTER — Inpatient Hospital Stay (HOSPITAL_COMMUNITY): Payer: 59 | Admitting: Physician Assistant

## 2023-04-20 ENCOUNTER — Telehealth: Payer: Self-pay | Admitting: Vascular Surgery

## 2023-04-20 DIAGNOSIS — I272 Pulmonary hypertension, unspecified: Secondary | ICD-10-CM | POA: Diagnosis present

## 2023-04-20 DIAGNOSIS — Z79899 Other long term (current) drug therapy: Secondary | ICD-10-CM

## 2023-04-20 DIAGNOSIS — Z825 Family history of asthma and other chronic lower respiratory diseases: Secondary | ICD-10-CM

## 2023-04-20 DIAGNOSIS — Z981 Arthrodesis status: Secondary | ICD-10-CM | POA: Diagnosis not present

## 2023-04-20 DIAGNOSIS — Z881 Allergy status to other antibiotic agents status: Secondary | ICD-10-CM

## 2023-04-20 DIAGNOSIS — Z808 Family history of malignant neoplasm of other organs or systems: Secondary | ICD-10-CM | POA: Diagnosis not present

## 2023-04-20 DIAGNOSIS — E785 Hyperlipidemia, unspecified: Secondary | ICD-10-CM | POA: Diagnosis not present

## 2023-04-20 DIAGNOSIS — Z791 Long term (current) use of non-steroidal anti-inflammatories (NSAID): Secondary | ICD-10-CM | POA: Diagnosis not present

## 2023-04-20 DIAGNOSIS — I714 Abdominal aortic aneurysm, without rupture, unspecified: Principal | ICD-10-CM | POA: Diagnosis present

## 2023-04-20 DIAGNOSIS — Z9049 Acquired absence of other specified parts of digestive tract: Secondary | ICD-10-CM

## 2023-04-20 DIAGNOSIS — R9431 Abnormal electrocardiogram [ECG] [EKG]: Secondary | ICD-10-CM | POA: Diagnosis not present

## 2023-04-20 DIAGNOSIS — Z91030 Bee allergy status: Secondary | ICD-10-CM

## 2023-04-20 DIAGNOSIS — T82310A Breakdown (mechanical) of aortic (bifurcation) graft (replacement), initial encounter: Secondary | ICD-10-CM | POA: Diagnosis not present

## 2023-04-20 DIAGNOSIS — Y712 Prosthetic and other implants, materials and accessory cardiovascular devices associated with adverse incidents: Secondary | ICD-10-CM | POA: Diagnosis present

## 2023-04-20 DIAGNOSIS — I340 Nonrheumatic mitral (valve) insufficiency: Secondary | ICD-10-CM

## 2023-04-20 DIAGNOSIS — I739 Peripheral vascular disease, unspecified: Secondary | ICD-10-CM | POA: Diagnosis present

## 2023-04-20 DIAGNOSIS — I700 Atherosclerosis of aorta: Secondary | ICD-10-CM

## 2023-04-20 DIAGNOSIS — I77810 Thoracic aortic ectasia: Secondary | ICD-10-CM | POA: Diagnosis not present

## 2023-04-20 DIAGNOSIS — Z85828 Personal history of other malignant neoplasm of skin: Secondary | ICD-10-CM

## 2023-04-20 DIAGNOSIS — Z8249 Family history of ischemic heart disease and other diseases of the circulatory system: Secondary | ICD-10-CM

## 2023-04-20 DIAGNOSIS — Z9842 Cataract extraction status, left eye: Secondary | ICD-10-CM

## 2023-04-20 DIAGNOSIS — I7143 Infrarenal abdominal aortic aneurysm, without rupture: Secondary | ICD-10-CM | POA: Diagnosis not present

## 2023-04-20 DIAGNOSIS — Z95828 Presence of other vascular implants and grafts: Secondary | ICD-10-CM | POA: Diagnosis not present

## 2023-04-20 DIAGNOSIS — Z9841 Cataract extraction status, right eye: Secondary | ICD-10-CM

## 2023-04-20 DIAGNOSIS — Z83438 Family history of other disorder of lipoprotein metabolism and other lipidemia: Secondary | ICD-10-CM | POA: Diagnosis not present

## 2023-04-20 DIAGNOSIS — Z818 Family history of other mental and behavioral disorders: Secondary | ICD-10-CM | POA: Diagnosis not present

## 2023-04-20 DIAGNOSIS — Z96643 Presence of artificial hip joint, bilateral: Secondary | ICD-10-CM | POA: Diagnosis not present

## 2023-04-20 DIAGNOSIS — Z9889 Other specified postprocedural states: Principal | ICD-10-CM

## 2023-04-20 DIAGNOSIS — F17200 Nicotine dependence, unspecified, uncomplicated: Secondary | ICD-10-CM | POA: Diagnosis not present

## 2023-04-20 DIAGNOSIS — F17210 Nicotine dependence, cigarettes, uncomplicated: Secondary | ICD-10-CM

## 2023-04-20 DIAGNOSIS — J449 Chronic obstructive pulmonary disease, unspecified: Secondary | ICD-10-CM | POA: Diagnosis present

## 2023-04-20 DIAGNOSIS — Z888 Allergy status to other drugs, medicaments and biological substances status: Secondary | ICD-10-CM

## 2023-04-20 DIAGNOSIS — K219 Gastro-esophageal reflux disease without esophagitis: Secondary | ICD-10-CM | POA: Diagnosis not present

## 2023-04-20 DIAGNOSIS — Z8679 Personal history of other diseases of the circulatory system: Secondary | ICD-10-CM | POA: Diagnosis not present

## 2023-04-20 DIAGNOSIS — M549 Dorsalgia, unspecified: Secondary | ICD-10-CM | POA: Diagnosis present

## 2023-04-20 DIAGNOSIS — Z961 Presence of intraocular lens: Secondary | ICD-10-CM | POA: Diagnosis present

## 2023-04-20 DIAGNOSIS — G8929 Other chronic pain: Secondary | ICD-10-CM | POA: Diagnosis not present

## 2023-04-20 DIAGNOSIS — R918 Other nonspecific abnormal finding of lung field: Secondary | ICD-10-CM | POA: Diagnosis not present

## 2023-04-20 HISTORY — PX: ULTRASOUND GUIDANCE FOR VASCULAR ACCESS: SHX6516

## 2023-04-20 HISTORY — PX: ABDOMINAL AORTIC ENDOVASCULAR STENT GRAFT: SHX5707

## 2023-04-20 LAB — POCT ACTIVATED CLOTTING TIME
Activated Clotting Time: 177 seconds
Activated Clotting Time: 262 seconds

## 2023-04-20 SURGERY — INSERTION, ENDOVASCULAR STENT GRAFT, AORTA, ABDOMINAL
Anesthesia: General | Site: Groin | Laterality: Bilateral

## 2023-04-20 MED ORDER — SODIUM CHLORIDE 0.9 % IV SOLN: INTRAVENOUS | Status: DC

## 2023-04-20 MED ORDER — 0.9 % SODIUM CHLORIDE (POUR BTL) OPTIME
TOPICAL | Status: DC | PRN
  Administered 2023-04-20: 1000 mL

## 2023-04-20 MED ORDER — HEPARIN SODIUM (PORCINE) 5000 UNIT/ML IJ SOLN
5000.0000 [IU] | Freq: Three times a day (TID) | INTRAMUSCULAR | Status: DC
  Administered 2023-04-21: 5000 [IU] via SUBCUTANEOUS
  Filled 2023-04-20: qty 1

## 2023-04-20 MED ORDER — PHENYLEPHRINE 80 MCG/ML (10ML) SYRINGE FOR IV PUSH (FOR BLOOD PRESSURE SUPPORT)
PREFILLED_SYRINGE | INTRAVENOUS | Status: DC | PRN
  Administered 2023-04-20 (×2): 80 ug via INTRAVENOUS

## 2023-04-20 MED ORDER — HYDROCODONE-ACETAMINOPHEN 10-325 MG PO TABS
1.0000 | ORAL_TABLET | Freq: Four times a day (QID) | ORAL | Status: DC | PRN
  Administered 2023-04-20 – 2023-04-21 (×2): 1 via ORAL
  Filled 2023-04-20 (×2): qty 1

## 2023-04-20 MED ORDER — EPHEDRINE SULFATE-NACL 50-0.9 MG/10ML-% IV SOSY
PREFILLED_SYRINGE | INTRAVENOUS | Status: DC | PRN
  Administered 2023-04-20: 5 mg via INTRAVENOUS

## 2023-04-20 MED ORDER — PHENYLEPHRINE HCL-NACL 20-0.9 MG/250ML-% IV SOLN
INTRAVENOUS | Status: DC | PRN
  Administered 2023-04-20: 20 ug/min via INTRAVENOUS

## 2023-04-20 MED ORDER — ORAL CARE MOUTH RINSE: 15.0000 mL | Freq: Once | OROMUCOSAL | Status: AC

## 2023-04-20 MED ORDER — SUGAMMADEX SODIUM 200 MG/2ML IV SOLN
INTRAVENOUS | Status: DC | PRN
  Administered 2023-04-20: 100 mg via INTRAVENOUS

## 2023-04-20 MED ORDER — ONDANSETRON HCL 4 MG/2ML IJ SOLN
INTRAMUSCULAR | Status: AC
  Filled 2023-04-20: qty 2

## 2023-04-20 MED ORDER — PROPOFOL 10 MG/ML IV BOLUS
INTRAVENOUS | Status: AC
  Filled 2023-04-20: qty 20

## 2023-04-20 MED ORDER — ACETAMINOPHEN 325 MG PO TABS
650.0000 mg | ORAL_TABLET | Freq: Four times a day (QID) | ORAL | Status: DC | PRN
  Administered 2023-04-20: 650 mg via ORAL
  Filled 2023-04-20: qty 2

## 2023-04-20 MED ORDER — LORATADINE 10 MG PO TABS
10.0000 mg | ORAL_TABLET | Freq: Every day | ORAL | Status: DC
  Administered 2023-04-20 – 2023-04-21 (×2): 10 mg via ORAL
  Filled 2023-04-20 (×2): qty 1

## 2023-04-20 MED ORDER — PANTOPRAZOLE SODIUM 40 MG PO TBEC
40.0000 mg | DELAYED_RELEASE_TABLET | Freq: Every day | ORAL | Status: DC
  Administered 2023-04-20 – 2023-04-21 (×2): 40 mg via ORAL
  Filled 2023-04-20 (×2): qty 1

## 2023-04-20 MED ORDER — PHENOL 1.4 % MT LIQD: 1.0000 | OROMUCOSAL | Status: DC | PRN

## 2023-04-20 MED ORDER — ASPIRIN 81 MG PO TBEC
81.0000 mg | DELAYED_RELEASE_TABLET | Freq: Every day | ORAL | Status: DC
  Administered 2023-04-21: 81 mg via ORAL
  Filled 2023-04-20: qty 1

## 2023-04-20 MED ORDER — DOCUSATE SODIUM 100 MG PO CAPS
100.0000 mg | ORAL_CAPSULE | Freq: Every day | ORAL | Status: DC
  Administered 2023-04-21: 100 mg via ORAL
  Filled 2023-04-20: qty 1

## 2023-04-20 MED ORDER — ADULT MULTIVITAMIN W/MINERALS CH
1.0000 | ORAL_TABLET | Freq: Every day | ORAL | Status: DC
  Administered 2023-04-20 – 2023-04-21 (×2): 1 via ORAL
  Filled 2023-04-20 (×2): qty 1

## 2023-04-20 MED ORDER — METOPROLOL TARTRATE 5 MG/5ML IV SOLN: 2.0000 mg | INTRAVENOUS | Status: DC | PRN

## 2023-04-20 MED ORDER — LACTATED RINGERS IV SOLN: INTRAVENOUS | Status: DC | PRN

## 2023-04-20 MED ORDER — HEPARIN 6000 UNIT IRRIGATION SOLUTION
Status: DC | PRN
  Administered 2023-04-20: 1

## 2023-04-20 MED ORDER — POLYETHYLENE GLYCOL 3350 17 G PO PACK: 17.0000 g | PACK | Freq: Every day | ORAL | Status: DC | PRN

## 2023-04-20 MED ORDER — LIDOCAINE 2% (20 MG/ML) 5 ML SYRINGE
INTRAMUSCULAR | Status: DC | PRN
  Administered 2023-04-20: 50 mg via INTRAVENOUS

## 2023-04-20 MED ORDER — AMISULPRIDE (ANTIEMETIC) 5 MG/2ML IV SOLN: 5.0000 mg | Freq: Once | INTRAVENOUS | Status: DC | PRN

## 2023-04-20 MED ORDER — FENTANYL CITRATE (PF) 250 MCG/5ML IJ SOLN
INTRAMUSCULAR | Status: AC
  Filled 2023-04-20: qty 5

## 2023-04-20 MED ORDER — POTASSIUM CHLORIDE CRYS ER 20 MEQ PO TBCR: 20.0000 meq | EXTENDED_RELEASE_TABLET | Freq: Every day | ORAL | Status: DC | PRN

## 2023-04-20 MED ORDER — MONTELUKAST SODIUM 10 MG PO TABS
10.0000 mg | ORAL_TABLET | Freq: Every day | ORAL | Status: DC
  Administered 2023-04-20: 10 mg via ORAL
  Filled 2023-04-20: qty 1

## 2023-04-20 MED ORDER — DEXAMETHASONE SODIUM PHOSPHATE 10 MG/ML IJ SOLN
INTRAMUSCULAR | Status: DC | PRN
  Administered 2023-04-20: 10 mg via INTRAVENOUS

## 2023-04-20 MED ORDER — METOPROLOL TARTRATE 5 MG/5ML IV SOLN
5.0000 mg | Freq: Once | INTRAVENOUS | Status: AC
  Administered 2023-04-20: 5 mg via INTRAVENOUS
  Filled 2023-04-20: qty 5

## 2023-04-20 MED ORDER — CHLORHEXIDINE GLUCONATE 0.12 % MT SOLN
15.0000 mL | Freq: Once | OROMUCOSAL | Status: AC
  Administered 2023-04-20: 15 mL via OROMUCOSAL
  Filled 2023-04-20: qty 15

## 2023-04-20 MED ORDER — ACETAMINOPHEN 325 MG PO TABS: 325.0000 mg | ORAL_TABLET | ORAL | Status: DC | PRN

## 2023-04-20 MED ORDER — CHLORHEXIDINE GLUCONATE CLOTH 2 % EX PADS
6.0000 | MEDICATED_PAD | Freq: Once | CUTANEOUS | Status: DC
  Administered 2023-04-20: 6 via TOPICAL

## 2023-04-20 MED ORDER — ROCURONIUM BROMIDE 10 MG/ML (PF) SYRINGE
PREFILLED_SYRINGE | INTRAVENOUS | Status: DC | PRN
  Administered 2023-04-20: 20 mg via INTRAVENOUS
  Administered 2023-04-20: 50 mg via INTRAVENOUS

## 2023-04-20 MED ORDER — IODIXANOL 320 MG/ML IV SOLN
INTRAVENOUS | Status: DC | PRN
  Administered 2023-04-20: 80.7 mL

## 2023-04-20 MED ORDER — SODIUM CHLORIDE 0.9 % IV SOLN: 500.0000 mL | Freq: Once | INTRAVENOUS | Status: DC | PRN

## 2023-04-20 MED ORDER — DEXAMETHASONE SODIUM PHOSPHATE 10 MG/ML IJ SOLN
INTRAMUSCULAR | Status: AC
  Filled 2023-04-20: qty 1

## 2023-04-20 MED ORDER — VANCOMYCIN HCL IN DEXTROSE 1-5 GM/200ML-% IV SOLN: 1000.0000 mg | Freq: Two times a day (BID) | INTRAVENOUS | Status: DC

## 2023-04-20 MED ORDER — ONDANSETRON HCL 4 MG/2ML IJ SOLN
INTRAMUSCULAR | Status: DC | PRN
  Administered 2023-04-20: 4 mg via INTRAVENOUS

## 2023-04-20 MED ORDER — EPHEDRINE 5 MG/ML INJ
INTRAVENOUS | Status: AC
  Filled 2023-04-20: qty 5

## 2023-04-20 MED ORDER — MORPHINE SULFATE (PF) 2 MG/ML IV SOLN: 2.0000 mg | INTRAVENOUS | Status: DC | PRN

## 2023-04-20 MED ORDER — CYCLOBENZAPRINE HCL 10 MG PO TABS: 5.0000 mg | ORAL_TABLET | Freq: Three times a day (TID) | ORAL | Status: DC | PRN

## 2023-04-20 MED ORDER — BISACODYL 10 MG RE SUPP: 10.0000 mg | Freq: Every day | RECTAL | Status: DC | PRN

## 2023-04-20 MED ORDER — AMLODIPINE BESYLATE 5 MG PO TABS
5.0000 mg | ORAL_TABLET | Freq: Every day | ORAL | Status: DC
  Administered 2023-04-20 – 2023-04-21 (×2): 5 mg via ORAL
  Filled 2023-04-20 (×2): qty 1

## 2023-04-20 MED ORDER — GUAIFENESIN-DM 100-10 MG/5ML PO SYRP: 15.0000 mL | ORAL_SOLUTION | ORAL | Status: DC | PRN

## 2023-04-20 MED ORDER — ROCURONIUM BROMIDE 10 MG/ML (PF) SYRINGE
PREFILLED_SYRINGE | INTRAVENOUS | Status: AC
  Filled 2023-04-20: qty 10

## 2023-04-20 MED ORDER — FENTANYL CITRATE (PF) 100 MCG/2ML IJ SOLN: 25.0000 ug | INTRAMUSCULAR | Status: DC | PRN

## 2023-04-20 MED ORDER — LIDOCAINE 2% (20 MG/ML) 5 ML SYRINGE
INTRAMUSCULAR | Status: AC
  Filled 2023-04-20: qty 5

## 2023-04-20 MED ORDER — PROTAMINE SULFATE 10 MG/ML IV SOLN
INTRAVENOUS | Status: DC | PRN
  Administered 2023-04-20: 50 mg via INTRAVENOUS

## 2023-04-20 MED ORDER — ATORVASTATIN CALCIUM 10 MG PO TABS
20.0000 mg | ORAL_TABLET | Freq: Every day | ORAL | Status: DC
  Administered 2023-04-20 – 2023-04-21 (×2): 20 mg via ORAL
  Filled 2023-04-20 (×2): qty 2

## 2023-04-20 MED ORDER — HEPARIN 6000 UNIT IRRIGATION SOLUTION
Status: AC
  Filled 2023-04-20: qty 500

## 2023-04-20 MED ORDER — HYDRALAZINE HCL 20 MG/ML IJ SOLN: 5.0000 mg | INTRAMUSCULAR | Status: DC | PRN

## 2023-04-20 MED ORDER — PROPOFOL 10 MG/ML IV BOLUS
INTRAVENOUS | Status: DC | PRN
  Administered 2023-04-20: 80 mg via INTRAVENOUS

## 2023-04-20 MED ORDER — LABETALOL HCL 5 MG/ML IV SOLN: 10.0000 mg | INTRAVENOUS | Status: DC | PRN

## 2023-04-20 MED ORDER — LEVOCETIRIZINE DIHYDROCHLORIDE 5 MG PO TABS: 5.0000 mg | ORAL_TABLET | Freq: Every day | ORAL | Status: DC | PRN

## 2023-04-20 MED ORDER — FENTANYL CITRATE (PF) 250 MCG/5ML IJ SOLN
INTRAMUSCULAR | Status: DC | PRN
  Administered 2023-04-20 (×2): 50 ug via INTRAVENOUS

## 2023-04-20 MED ORDER — HEPARIN SODIUM (PORCINE) 1000 UNIT/ML IJ SOLN
INTRAMUSCULAR | Status: DC | PRN
  Administered 2023-04-20: 5000 [IU] via INTRAVENOUS
  Administered 2023-04-20: 3000 [IU] via INTRAVENOUS

## 2023-04-20 MED ORDER — VANCOMYCIN HCL IN DEXTROSE 1-5 GM/200ML-% IV SOLN
1000.0000 mg | INTRAVENOUS | Status: AC
  Administered 2023-04-20: 1000 mg via INTRAVENOUS
  Filled 2023-04-20: qty 200

## 2023-04-20 MED ORDER — DIAZEPAM 5 MG PO TABS: 5.0000 mg | ORAL_TABLET | Freq: Two times a day (BID) | ORAL | Status: DC | PRN

## 2023-04-20 MED ORDER — MAGNESIUM SULFATE 2 GM/50ML IV SOLN: 2.0000 g | Freq: Every day | INTRAVENOUS | Status: DC | PRN

## 2023-04-20 MED ORDER — PHENYLEPHRINE 80 MCG/ML (10ML) SYRINGE FOR IV PUSH (FOR BLOOD PRESSURE SUPPORT)
PREFILLED_SYRINGE | INTRAVENOUS | Status: AC
  Filled 2023-04-20: qty 10

## 2023-04-20 MED ORDER — ALUM & MAG HYDROXIDE-SIMETH 200-200-20 MG/5ML PO SUSP: 15.0000 mL | ORAL | Status: DC | PRN

## 2023-04-20 MED ORDER — ACETAMINOPHEN 650 MG RE SUPP: 325.0000 mg | RECTAL | Status: DC | PRN

## 2023-04-20 MED ORDER — LACTATED RINGERS IV SOLN: INTRAVENOUS | Status: DC

## 2023-04-20 SURGICAL SUPPLY — 48 items
ADH SKN CLS APL DERMABOND .7 (GAUZE/BANDAGES/DRESSINGS) ×2
BAG COUNTER SPONGE SURGICOUNT (BAG) ×2 IMPLANT
BAG SPNG CNTER NS LX DISP (BAG) ×1
BAG URIMETER BARDEX IC 350 (UROLOGICAL SUPPLIES) IMPLANT
CANISTER SUCT 3000ML PPV (MISCELLANEOUS) ×2 IMPLANT
CATH BEACON 5.038 65CM KMP-01 (CATHETERS) ×2 IMPLANT
CATH OMNI FLUSH .035X70CM (CATHETERS) ×1 IMPLANT
DERMABOND ADVANCED .7 DNX12 (GAUZE/BANDAGES/DRESSINGS) ×2 IMPLANT
DEVICE CLOSURE PERCLS PRGLD 6F (VASCULAR PRODUCTS) ×8 IMPLANT
DEVICE TORQUE KENDALL .025-038 (MISCELLANEOUS) IMPLANT
DRSG TEGADERM 2-3/8X2-3/4 SM (GAUZE/BANDAGES/DRESSINGS) ×4 IMPLANT
ELECT REM PT RETURN 9FT ADLT (ELECTROSURGICAL) ×1
ELECTRODE REM PT RTRN 9FT ADLT (ELECTROSURGICAL) ×4 IMPLANT
EXCLDR TRNK ENDO 23X14.5X12 15 (Endovascular Graft) ×1 IMPLANT
EXCLUDER TNK END 23X14.5X12 15 (Endovascular Graft) IMPLANT
GAUZE SPONGE 2X2 8PLY STRL LF (GAUZE/BANDAGES/DRESSINGS) ×2 IMPLANT
GLOVE BIO SURGEON STRL SZ7.5 (GLOVE) ×2 IMPLANT
GLOVE BIOGEL PI IND STRL 8 (GLOVE) ×2 IMPLANT
GOWN STRL REUS W/ TWL LRG LVL3 (GOWN DISPOSABLE) ×6 IMPLANT
GOWN STRL REUS W/ TWL XL LVL3 (GOWN DISPOSABLE) ×2 IMPLANT
GOWN STRL REUS W/TWL LRG LVL3 (GOWN DISPOSABLE) ×3
GOWN STRL REUS W/TWL XL LVL3 (GOWN DISPOSABLE) ×1
GRAFT BALLN CATH 65CM (BALLOONS) ×2 IMPLANT
GUIDEWIRE ANGLED .035X150CM (WIRE) IMPLANT
KIT BASIN OR (CUSTOM PROCEDURE TRAY) ×2 IMPLANT
KIT TURNOVER KIT B (KITS) ×2 IMPLANT
LEG CONTRALATERAL 16X20X13.5 (Vascular Products) IMPLANT
LEG CONTRALATERAL 20X11.5 (Vascular Products) ×1 IMPLANT
LEG CONTRALETERAL16X16X11.5 (Endovascular Graft) ×1 IMPLANT
NS IRRIG 1000ML POUR BTL (IV SOLUTION) ×2 IMPLANT
PACK ENDOVASCULAR (PACKS) ×2 IMPLANT
PAD ARMBOARD 7.5X6 YLW CONV (MISCELLANEOUS) ×4 IMPLANT
PERCLOSE PROGLIDE 6F (VASCULAR PRODUCTS) ×5
SET MICROPUNCTURE 5F STIFF (MISCELLANEOUS) ×2 IMPLANT
SHEATH BRITE TIP 8FR 23CM (SHEATH) ×2 IMPLANT
SHEATH PINNACLE 8F 10CM (SHEATH) ×2 IMPLANT
STENT GRAFT CONTRALAT 16X11.5 (Endovascular Graft) IMPLANT
STENT GRAFT CONTRALAT 20X11.5 (Vascular Products) IMPLANT
STENT GRAFT CONTRALAT 20X13.5 (Vascular Products) IMPLANT
STOPCOCK MORSE 400PSI 3WAY (MISCELLANEOUS) ×1 IMPLANT
SUT MNCRL AB 4-0 PS2 18 (SUTURE) ×2 IMPLANT
SYR 20ML LL LF (SYRINGE) ×2 IMPLANT
TOWEL GREEN STERILE (TOWEL DISPOSABLE) ×2 IMPLANT
TRAY FOLEY MTR SLVR 16FR STAT (SET/KITS/TRAYS/PACK) ×2 IMPLANT
TUBING HIGH PRESSURE 120CM (CONNECTOR) ×2 IMPLANT
WATER STERILE IRR 1000ML POUR (IV SOLUTION) IMPLANT
WIRE AMPLATZ SS-J .035X180CM (WIRE) ×4 IMPLANT
WIRE BENTSON .035X145CM (WIRE) ×4 IMPLANT

## 2023-04-20 NOTE — Op Note (Signed)
Date: April 20, 2023  Preoperative diagnosis: 5.2 cm abdominal aortic aneurysm  Postoperative diagnosis: Same  Procedure: 1.  Ultrasound-guided access of bilateral common femoral arteries with delivery of Perclose closure device 2.  Abdominal aortogram with iliac arteriogram with catheter selection of aorta 3.  Repair of abdominal aortic aneurysm using endovascular aortobiiliac stent graft (cpt 56433 and 29518)  Surgeon: Dr. Cephus Shelling, MD  Assistant: Doreatha Massed, PA  Indications: 77 year old female that has been followed for an abdominal aortic aneurysm.  Recent surveillance showed this has increased to greater than 5 cm now measuring 5.2 cm.  She presents for stent graft repair after risks benefits discussed.  Assistant was needed for sheath exchanges and for holding manual pressure after the stent graft was deployed to place the closure devices in each groin.  Findings: Ultrasound-guided access of bilateral common femoral arteries with delivery of Perclose closure device.  12 [place] Gore dry seal sheath was placed in the right common femoral artery and a 16 French Gore dry seal sheath was placed in the left common femoral artery.  Main body device went up the left side and was a 23 mm x 14 mm x 12 cm.  After the gate was cannulated we performed a bell bottom on the right into the common iliac with preservation of the hypogastric using a 20 mm x 11.5 cm limb.  On the left we performed a bell bottom into the left common iliac artery with preservation of the hypogastric using a 16 mm x 11.5 cm limb.  We did have to reballoon the proximal stent graft after final angiogram showed a type Ia endoleak.  After we did this there was no endoleak at completion.  Anesthesia: General  Details: Patient was taken to the operating room after informed consent was obtained.  Placed on the operative table in the supine position.  General endotracheal anesthesia was induced.  The abdomen and both  groins were then prepped and draped in standard sterile fashion.  Ultimately antibiotics were given and timeout performed.  Initially evaluated the left common femoral artery with ultrasound, it was patent, an image was saved.  This was accessed with a micro access needle in the common femoral artery and I then made a stab incision with 11 blade scalpel and dissected down with a hemostat.  I then placed a micro access sheath and then a Bentson wire and then dilated with an 8 [place] dilator.  We then placed Perclose devices at 10:00 and 2:00 in the left common femoral artery.  I then placed an 8 French sheath in the left common femoral artery.  We then repeated the same steps in the right common femoral artery again using ultrasound to cannulate common femoral with micro access needle placed a microwire and then a microsheath and then a Bentson wire and dilated with an 8 French dilator and placed Perclose devices at 10:00 and 2:00 and then placed an 8 [place] sheath.  Patient was then given 100 units/kg IV heparin.  ACT was checked to maintain greater than 250.  Additional heparin was given during the case.  I then exchanged for stiff Amplatz wires in both groins into the abdominal aorta.  A 12 French Gore dry seal sheath was placed in the right common femoral artery into the abdominal aorta and a 16 French dry seal sheath was placed in the left common femoral artery.  We then delivered the main body device up the left side and put a pigtail on  the right side.  Abdominal angiogram was obtained to identify the renal arteries including the lowest right accessory renal.  The main body device was deployed below the right accessory renal down to the gate.  We selected a 23 mm x 14 mm x 12 cm main body device.  I then came from the right groin with a buddy wire and I was able to cannulate the gate of the main body and spin pigtail catheter to confirm we were in the main body of the endograft.  We then used a pigtail  catheter to get a retrograde sheath shot to identify the right hypogastric.  On the right we deployed a 20 mm x 11.5 cm bell bottom into the common iliac artery.  We then finished deploying the main body down the left and then got a retrograde sheath shot and placed a 16 mm x 11.5 cm bell bottom into the left common iliac artery.  We then used a MOB balloon and performed angioplasty of all proximal distal landing zones as well as overlapping components.  Final abdominal angiogram showed what look like a type Ia endoleak.  I then went back up and we ballooned the proximal stent graft more aggressively.  We repeated a proximal angiogram that showed good seal with no endoleak.  Wires and catheters were removed and we then tied down Perclose devices in both groins after we manually removed each sheath with the help of my assistant.  There was good Doppler signals in the feet.  Protamine was given for reversal.  We closed both groins with 4-0 Monocryl and Dermabond.  Taken to recovery in stable condition.  Complication: None  Condition: Stable  Cephus Shelling, MD [facility] [place] Office: [phone]   Cephus Shelling

## 2023-04-20 NOTE — Anesthesia Procedure Notes (Signed)
Procedure Name: Intubation Date/Time: 04/20/2023 7:53 AM  Performed by: Alease Medina, CRNAPre-anesthesia Checklist: Patient identified, Emergency Drugs available, Suction available and Patient being monitored Patient Re-evaluated:Patient Re-evaluated prior to induction Oxygen Delivery Method: Circle system utilized Preoxygenation: Pre-oxygenation with 100% oxygen Induction Type: IV induction Ventilation: Mask ventilation without difficulty Laryngoscope Size: Miller and 2 Grade View: Grade II Tube type: Oral Tube size: 7.0 mm Number of attempts: 1 Airway Equipment and Method: Stylet and Oral airway Placement Confirmation: ETT inserted through vocal cords under direct vision, positive ETCO2 and breath sounds checked- equal and bilateral Secured at: 21 cm Tube secured with: Tape Dental Injury: Teeth and Oropharynx as per pre-operative assessment  Comments: Performed by Almon Register

## 2023-04-20 NOTE — Transfer of Care (Signed)
Immediate Anesthesia Transfer of Care Note  Patient: [name]  Procedure(s) Performed: ABDOMINAL AORTIC ENDOVASCULAR STENT GRAFT (Bilateral: Groin) ULTRASOUND GUIDANCE FOR VASCULAR ACCESS (Bilateral: Groin)  Patient Location: PACU  Anesthesia Type:General  Level of Consciousness: awake, alert , and oriented  Airway & Oxygen Therapy: Patient connected to nasal cannula oxygen  Post-op Assessment: Report given to RN and Post -op Vital signs reviewed and stable  Post vital signs: Reviewed and stable  Last Vitals:  Vitals Value Taken Time  BP    Temp    Pulse    Resp    SpO2      Last Pain:  Vitals:   04/20/23 0605  PainSc: 0-No pain         Complications: There were no known notable events for this encounter.

## 2023-04-20 NOTE — Telephone Encounter (Signed)
-----   Message from [facility] sent at 04/20/2023  9:42 AM EDT ----- S/p EVAR 7/22.  Please have pt f/u in 4-5 weeks with CTA a/p and see Dr. Chestine Spore.  Thanks

## 2023-04-20 NOTE — Plan of Care (Signed)
  Problem: Education: Goal: Knowledge of General Education information will improve Description: Including pain rating scale, medication(s)/side effects and non-pharmacologic comfort measures Outcome: Progressing   Problem: Health Behavior/Discharge Planning: Goal: Ability to manage health-related needs will improve Outcome: Progressing   Problem: Clinical Measurements: Goal: Ability to maintain clinical measurements within normal limits will improve Outcome: Progressing Goal: Will remain free from infection Outcome: Progressing Goal: Diagnostic test results will improve Outcome: Progressing Goal: Respiratory complications will improve Outcome: Progressing Goal: Cardiovascular complication will be avoided Outcome: Progressing   Problem: Activity: Goal: Risk for activity intolerance will decrease Outcome: Progressing   Problem: Nutrition: Goal: Adequate nutrition will be maintained Outcome: Progressing   Problem: Coping: Goal: Level of anxiety will decrease Outcome: Progressing   Problem: Elimination: Goal: Will not experience complications related to bowel motility Outcome: Progressing Goal: Will not experience complications related to urinary retention Outcome: Progressing   Problem: Pain Managment: Goal: General experience of comfort will improve Outcome: Progressing   Problem: Safety: Goal: Ability to remain free from injury will improve Outcome: Progressing   Problem: Skin Integrity: Goal: Risk for impaired skin integrity will decrease Outcome: Progressing   Problem: Education: Goal: Knowledge of discharge needs will improve Outcome: Progressing   Problem: Clinical Measurements: Goal: Postoperative complications will be avoided or minimized Outcome: Progressing   Problem: Respiratory: Goal: Ability to achieve and maintain a regular respiratory rate will improve Outcome: Progressing   Problem: Skin Integrity: Goal: Demonstration of wound healing  without infection will improve Outcome: Progressing   

## 2023-04-20 NOTE — Progress Notes (Signed)
Pre-op vanc 1g will provide 24 hrs of abx. No need for post op doses.   Ulyses Southward, PharmD, BCIDP, AAHIVP, CPP Infectious Disease Pharmacist 04/20/2023 1:25 PM

## 2023-04-20 NOTE — Progress Notes (Addendum)
  Day of Surgery Note    Subjective:  resting in recovery; no complaints   Vitals:   04/20/23 1030 04/20/23 1045  BP: (!) 148/83 130/75  Pulse: 92 97  Resp: 14 15  Temp:    SpO2: 97% 96%    Incisions:   bilateral groins are soft without hematoma Extremities:  brisk PT doppler flow bilaterally Cardiac:  regular Lungs:  non labored Abdomen:  soft   Assessment/Plan:  This is a 77 y.o. female who is s/p  EVAR  -pt doing well in recovery with brisk doppler flow bilateral PT -bilateral groins are soft -baby asa started today; continue statin -sq heparin to start tomorrow morning -bed rest x 4 hours -to 4 east later today-anticipate discharge home tomorrow if post op uneventful.   -PDMP reviewed-has pain medication prior to admit. -no zofran or phenergan ordered due to prolonged QT interval   Doreatha Massed, PA-C 04/20/2023 10:47 AM [phone]

## 2023-04-20 NOTE — Anesthesia Procedure Notes (Addendum)
Arterial Line Insertion Start/End7/22/2024 7:00 AM, 04/20/2023 7:34 AM Performed by: Lewie Loron, MD, anesthesiologist  Patient location: Pre-op. Preanesthetic checklist: patient identified, IV checked, site marked, risks and benefits discussed, surgical consent, monitors and equipment checked, pre-op evaluation, timeout performed and anesthesia consent Lidocaine 1% used for infiltration Right, radial was placed Catheter size: 20 G Hand hygiene performed  and maximum sterile barriers used   Attempts: 1 Procedure performed using ultrasound guided technique. Ultrasound Notes:anatomy identified, needle tip was noted to be adjacent to the nerve/plexus identified, no ultrasound evidence of intravascular and/or intraneural injection and image(s) printed for medical record Following insertion, dressing applied and Biopatch. Post procedure assessment: normal and unchanged  Post procedure complications: unsuccessful attempts, second provider assisted and local hematoma. Patient tolerated the procedure with difficulty.

## 2023-04-20 NOTE — H&P (Signed)
History and Physical Interval Note:  04/20/2023 7:27 AM  [name]  has presented today for surgery, with the diagnosis of Infrarenal abdominal aortic aneurysm without rupture.  The various methods of treatment have been discussed with the patient and family. After consideration of risks, benefits and other options for treatment, the patient has consented to  Procedure(s): ABDOMINAL AORTIC ENDOVASCULAR STENT GRAFT (N/A) as a surgical intervention.  The patient's history has been reviewed, patient examined, no change in status, stable for surgery.  I have reviewed the patient's chart and labs.  Questions were answered to the patient's satisfaction.    EVAR for AAA repair.  Cephus Shelling     Patient name: [name] MRN: [id number]        DOB: 1946/01/20        Sex: female   REASON FOR VISIT: Follow-up after CTA for evaluation of 5 cm abdominal aortic aneurysm   HPI: [name] is a 77 y.o. female with history of COPD, hyperlipidemia as well as tobacco abuse that presents for follow-up after CTA for further evaluation of her abdominal aortic aneurysm.  She has been followed for a abdominal and celiac artery aneurysm by Dr. Hart Rochester.  On the last visit on 03/10/2023 her AAA had increased to 5.05 cm.  We sent her for CTA.   Cut back on smoking and down to 4-5 cigarettes a day.  No new abdominal pain.  Chronic back pain at baseline.  No new concerns today.  Her mother and grandmother died of AAA.         Past Medical History:  Diagnosis Date   AAA (abdominal aortic aneurysm) (HCC)      saw Dr Hart Rochester 11/2013.  2.7 cm   Arthritis     Chicken pox     COPD (chronic obstructive pulmonary disease) (HCC)      smokes 1/2ppd   DDD (degenerative disc disease), lumbar     GERD (gastroesophageal reflux disease)      diet controlled   History of blood transfusion     History of lumbar fusion 08/09/2014   Hyperlipidemia     IBS (irritable bowel syndrome)      diet contolled    Mild mitral regurgitation     RBBB     S/P arthroscopy of right shoulder 12/15/2017   Seasonal allergies     Varicose veins                 Past Surgical History:  Procedure Laterality Date   APPENDECTOMY       BACK SURGERY N/A [phone]    5 back surgeries   BREAST BIOPSY   1989,05    lt br bx   BREAST EXCISIONAL BIOPSY Bilateral     CATARACT EXTRACTION W/ INTRAOCULAR LENS IMPLANT Bilateral 2018   CHOLECYSTECTOMY       EXCISION/RELEASE BURSA HIP Right 05/12/2013    Procedure: EXCISION RIGHT TROCHANTERIC BURSA/CALCIFICATION ;  Surgeon: Loreta Ave, MD;  Location: [facility];  Service: Orthopedics;  Laterality: Right;   FASCIOTOMY Right 05/12/2013    Procedure: RIGHT FASCIOTOMY HIP ANY TYPE;  Surgeon: Loreta Ave, MD;  Location: [facility];  Service: Orthopedics;  Laterality: Right;   LARYNGOSCOPY AND BRONCHOSCOPY N/A 08/15/2019    Procedure: BRONCHOSCOPY/LARYNGOSCOPY;  Surgeon: Newman Pies, MD;  Location: [facility];  Service: ENT;  Laterality: N/A;   NM MYOCAR PERF WALL MOTION   04/13/2003    negative  SHOULDER ARTHROSCOPY WITH OPEN ROTATOR CUFF REPAIR Right 2018   skin cancer removed right leg       TONSILLECTOMY AND ADENOIDECTOMY   1950   TOTAL HIP ARTHROPLASTY Left 2006    lt total hip   TOTAL HIP ARTHROPLASTY Right 2011   TOTAL HIP ARTHROPLASTY Right 2016    second surgery due to recall   US ECHOCARDIOGRAPHY   04/13/2003    EF 65%, mild TR w/suggestion of mild pulmonary hypertension,thickened anterior leaflet,central mild MR.   VAGINAL HYSTERECTOMY   1993               Family History  Problem Relation Age of Onset   Early death Mother          aortic aneurysm   Vasculitis Mother          temporal arteritis   Aortic aneurysm Mother     Heart disease Mother          before age 29   Hyperlipidemia Mother     Hypertension Mother     COPD Sister     Hypertension Sister     Depression Sister     Heart disease  Sister          before age 74   Post-traumatic stress disorder Sister     Bipolar disorder Sister     Brain cancer Maternal Aunt 26        brain   Heart disease Maternal Grandmother          aortic aneurysm   Aortic aneurysm Maternal Grandmother     Hypertension Sister     Throat cancer Maternal Grandfather     Breast cancer Neg Hx            SOCIAL HISTORY: Social History         Tobacco Use   Smoking status: Every Day      Packs/day: 0.75      Years: 30.00      Additional pack years: 0.00      Total pack years: 22.50      Types: Cigarettes   Smokeless tobacco: Never  Substance Use Topics   Alcohol use: Not Currently      Alcohol/week: 0.0 standard drinks of alcohol      Comment: very occasional, not monthly      Allergies       Allergies  Allergen Reactions   Bee Venom        Other reaction(s): Redness   Cephalexin Other (See Comments)      Headaches   Gabapentin Diarrhea              Current Outpatient Medications  Medication Sig Dispense Refill   amLODipine (NORVASC) 5 MG tablet Take 1 tablet (5 mg total) by mouth daily. 90 tablet 3   atorvastatin (LIPITOR) 20 MG tablet Take 1 tablet (20 mg total) by mouth daily. 90 tablet 3   cyclobenzaprine (FLEXERIL) 5 MG tablet Take 1 tablet (5 mg total) by mouth 3 (three) times daily as needed for muscle spasms. 180 tablet 3   diazepam (VALIUM) 5 MG tablet TAKE ONE TABLET DAILY AS NEEDED FOR ANXIETY 30 tablet 1   HYDROcodone-acetaminophen (NORCO) 10-325 MG tablet Take 1 tablet by mouth 3 (three) times daily as needed for moderate pain.       ipratropium (ATROVENT) 0.06 % nasal spray Place 1-2 sprays into both nostrils in the morning and at bedtime.  levocetirizine (XYZAL) 5 MG tablet Take 5 mg by mouth at bedtime.       meloxicam (MOBIC) 15 MG tablet Take 1 tablet (15 mg total) by mouth daily. 30 tablet 0   montelukast (SINGULAIR) 10 MG tablet TAKE ONE TABLET AT BEDTIME 90 tablet 1   Multiple Vitamin  (MULTIVITAMIN) tablet Take 1 tablet by mouth daily.       promethazine (PHENERGAN) 12.5 MG tablet TAKE ONE TABLET EVERY 8 HOURS AS NEEDED FOR NAUSEA AND VOMITING 20 tablet 0   vitamin B-12 (CYANOCOBALAMIN) 1000 MCG tablet Take 1 tablet (1,000 mcg total) by mouth daily. 100 tablet 2      No current facility-administered medications for this visit.        REVIEW OF SYSTEMS:  [X]  denotes positive finding, [ ]  denotes negative finding Cardiac   Comments:  Chest pain or chest pressure:      Shortness of breath upon exertion:      Short of breath when lying flat:      Irregular heart rhythm:             Vascular      Pain in calf, thigh, or hip brought on by ambulation:      Pain in feet at night that wakes you up from your sleep:       Blood clot in your veins:      Leg swelling:              Pulmonary      Oxygen at home:      Productive cough:       Wheezing:              Neurologic      Sudden weakness in arms or legs:       Sudden numbness in arms or legs:       Sudden onset of difficulty speaking or slurred speech:      Temporary loss of vision in one eye:       Problems with dizziness:              Gastrointestinal      Blood in stool:       Vomited blood:              Genitourinary      Burning when urinating:       Blood in urine:             Psychiatric      Major depression:              Hematologic      Bleeding problems:      Problems with blood clotting too easily:             Skin      Rashes or ulcers:             Constitutional      Fever or chills:          PHYSICAL EXAM: There were no vitals filed for this visit.     GENERAL: The patient is a well-nourished female, in no acute distress. The vital signs are documented above. CARDIAC: There is a regular rate and rhythm.  VASCULAR:  2+ femoral pulses palpable bilaterally Both DP pulses weakly palpable PULMONARY: No respiratory distress. ABDOMEN:   No pain with deep palpation of her abdomen  and no pain with palpation of aneurysm. MUSCULOSKELETAL: There are no major deformities or cyanosis. NEUROLOGIC: No  focal weakness or paresthesias are detected. PSYCHIATRIC: The patient has a normal affect.   DATA:    CTA reviewed from 03/19/2023 showing a 5.2 cm abdominal aortic aneurysm   Assessment/Plan:   77 year old female that has been followed for known abdominal aortic aneurysm as well as small celiac artery aneurysm.  Discussed that her CTA confirms a greater than 5 cm abdominal aortic aneurysm and I have measured her AAA at 5.2 cm.  I have recommended stent graft repair given risk of rupture.  Discussed at this size her AAA has a yearly rupture risk of 10-15%.  I discussed in women we repair these at greater than 5 cm.  I have recommended bilateral transfemoral access in the OR with aortobiiliac stent graft.  Discussed risk and benefits including risk of anesthesia, risk of vessel injury requiring cut down, risk of MI, risk of stroke, risk of renal insufficiency and pulmonary problems.  She wished to proceed.  Will get scheduled for 04/20/2023.   Cephus Shelling, MD [facility] Office: [phone]   Cephus Shelling

## 2023-04-20 NOTE — Progress Notes (Signed)
Pt arrived to unit from cath lab  VSS, A/O x 4,  CCMD called ,CHG given, pt oriented to unit, bilateral groin level 0. Pt educated on bedrest til 2pm  Everlean Cherry, RN    04/20/23 1227  Vitals  Temp 97.9 F (36.6 C)  Temp Source Oral  BP (!) 140/92  MAP (mmHg) 104  BP Location Left Arm  BP Method Automatic  Patient Position (if appropriate) Lying  Pulse Rate (!) 115  Pulse Rate Source Monitor  ECG Heart Rate (!) 115  Resp 14  Oxygen Therapy  SpO2 98 %  O2 Device Nasal Cannula  O2 Flow Rate (L/min) 2 L/min  Art Line  Arterial Line BP 168/75  Arterial Line MAP (mmHg) 105 mmHg  Pain Assessment  Pain Scale 0-10  Pain Score Asleep  Height and Weight  Height 5\' 6"  (1.676 m)  Weight in (lb) to have BMI = 25 154.6  MEWS Score  MEWS Temp 0  MEWS Systolic 0  MEWS Pulse 2  MEWS RR 0  MEWS LOC 1  MEWS Score 3  MEWS Score Color Yellow

## 2023-04-20 NOTE — Discharge Instructions (Signed)
  [facility]   Discharge Instructions  Endovascular Aortic Aneurysm Repair  Please refer to the following instructions for your post-procedure care. Your surgeon or Physician Assistant will discuss any changes with you.  Activity  You are encouraged to walk as much as you can. You can slowly return to normal activities but must avoid strenuous activity and heavy lifting until your doctor tells you it's OK. Avoid activities such as vacuuming or swinging a gold club. It is normal to feel tired for several weeks after your surgery. Do not drive until your doctor gives the OK and you are no longer taking prescription pain medications. It is also normal to have difficulty with sleep habits, eating, and bowel movements after surgery. These will go away with time.  Bathing/Showering  Shower daily after you go home.  Do not soak in a bathtub, hot tub, or swim until the incision heals completely.  If you have incisions in your groin, wash the groin wounds with soap and water daily and pat dry. (No tub bath-only shower)  Then put a dry gauze or washcloth there to keep this area dry to help prevent wound infection daily and as needed.  Do not use Vaseline or neosporin on your incisions.  Only use soap and water on your incisions and then protect and keep dry.  Incision Care  Shower every day. Clean your incision with mild soap and water. Pat the area dry with a clean towel. You do not need a bandage unless otherwise instructed. Do not apply any ointments or creams to your incision. If you clothing is irritating, you may cover your incision with a dry gauze pad.  Diet  Resume your normal diet. There are no special food restrictions following this procedure. A low fat/low cholesterol diet is recommended for all patients with vascular disease. In order to heal from your surgery, it is CRITICAL to get adequate nutrition. Your body requires vitamins, minerals, and protein.  Vegetables are the best source of vitamins and minerals. Vegetables also provide the perfect balance of protein. Processed food has little nutritional value, so try to avoid this.  Medications  Resume taking all of your medications unless your doctor or nurse practitioner tells you not to. If your incision is causing pain, you may take over-the-counter pain relievers such as acetaminophen (Tylenol). If you were prescribed a stronger pain medication, please be aware these medications can cause nausea and constipation. Prevent nausea by taking the medication with a snack or meal. Avoid constipation by drinking plenty of fluids and eating foods with a high amount of fiber, such as fruits, vegetables, and grains.  Do not take Tylenol if you are taking prescription pain medications.   Follow up  Our office will schedule a follow-up appointment with a CT scan 3-4 weeks after your surgery.  Please call us immediately for any of the following conditions  Severe or worsening pain in your legs or feet or in your abdomen back or chest. Increased pain, redness, drainage (pus) from your incision site. Increased abdominal pain, bloating, nausea, vomiting or persistent diarrhea. Fever of 101 degrees or higher. Swelling in your leg (s),  Reduce your risk of vascular disease  Stop smoking. If you would like help call [facility] at 1-800-QUIT-NOW (1-800-784-8669) or [place] at 336-586-4000. Manage your cholesterol Maintain a desired weight Control your diabetes Keep your blood pressure down  If you have questions, please call the office at [phone].  

## 2023-04-21 LAB — CBC
HCT: 34.2 % — ABNORMAL LOW (ref 36.0–46.0)
Hemoglobin: 11.9 g/dL — ABNORMAL LOW (ref 12.0–15.0)
MCH: 33.3 pg (ref 26.0–34.0)
MCHC: 34.8 g/dL (ref 30.0–36.0)
MCV: 95.8 fL (ref 80.0–100.0)
Platelets: 174 10*3/uL (ref 150–400)
RBC: 3.57 MIL/uL — ABNORMAL LOW (ref 3.87–5.11)
RDW: 14.5 % (ref 11.5–15.5)
WBC: 11.1 10*3/uL — ABNORMAL HIGH (ref 4.0–10.5)
nRBC: 0 % (ref 0.0–0.2)

## 2023-04-21 LAB — BASIC METABOLIC PANEL
Anion gap: 11 (ref 5–15)
BUN: 12 mg/dL (ref 8–23)
CO2: 23 mmol/L (ref 22–32)
Calcium: 8.6 mg/dL — ABNORMAL LOW (ref 8.9–10.3)
Chloride: 101 mmol/L (ref 98–111)
Creatinine, Ser: 0.73 mg/dL (ref 0.44–1.00)
GFR, Estimated: >60 mL/min (ref >60)
Glucose, Bld: 139 mg/dL — ABNORMAL HIGH (ref 70–99)
Potassium: 3.6 mmol/L (ref 3.5–5.1)
Sodium: 135 mmol/L (ref 135–145)

## 2023-04-21 MED ORDER — ASPIRIN 81 MG PO TBEC: 81.0000 mg | DELAYED_RELEASE_TABLET | Freq: Every day | ORAL | Status: DC

## 2023-04-21 NOTE — Progress Notes (Addendum)
  Progress Note    04/21/2023 6:35 AM 1 Day Post-Op  Subjective:  no complaints.  Says when the a-line was removed this morning around 3:30am, it bled through the bandage.  She denies any pain in her feet or her right hand.   Afebrile HR 70's-90's NSR 90's-120's systolic 95% RA  Vitals:   04/20/23 2300 04/21/23 0302  BP: 103/68 121/76  Pulse: 80 75  Resp: 15 11  Temp: 98.3 F (36.8 C) 98.3 F (36.8 C)  SpO2: 93% 92%    Physical Exam: General:  sleeping, wakes to voice.  No distress Cardiac:  regular Lungs:  non labored Incisions:  bilateral groins are soft without hematoma Extremities:  brisk doppler flow bilateral PT and right radial.  Motor and sensory are in tact Abdomen:  soft  CBC    Component Value Date/Time   WBC 11.1 (H) 04/21/2023 0315   RBC 3.57 (L) 04/21/2023 0315   HGB 11.9 (L) 04/21/2023 0315   HGB 15.1 11/08/2020 1420   HCT 34.2 (L) 04/21/2023 0315   HCT 43.6 11/08/2020 1420   PLT 174 04/21/2023 0315   PLT 227 11/08/2020 1420   MCV 95.8 04/21/2023 0315   MCV 98 (H) 11/08/2020 1420   MCH 33.3 04/21/2023 0315   MCHC 34.8 04/21/2023 0315   RDW 14.5 04/21/2023 0315   RDW 13.1 11/08/2020 1420   LYMPHSABS 2.3 11/08/2020 1420   MONOABS 0.9 01/07/2014 0526   EOSABS 0.2 11/08/2020 1420   BASOSABS 0.1 11/08/2020 1420    BMET    Component Value Date/Time   NA 135 04/21/2023 0315   NA 136 11/08/2020 1420   K 3.6 04/21/2023 0315   CL 101 04/21/2023 0315   CO2 23 04/21/2023 0315   GLUCOSE 139 (H) 04/21/2023 0315   BUN 12 04/21/2023 0315   BUN 12 11/08/2020 1420   CREATININE 0.73 04/21/2023 0315   CALCIUM 8.6 (L) 04/21/2023 0315   GFRNONAA >60 04/21/2023 0315   GFRAA 77 11/08/2020 1420    INR    Component Value Date/Time   INR 1.0 04/14/2023 1129     Intake/Output Summary (Last 24 hours) at 04/21/2023 9562 Last data filed at 04/21/2023 0530 Gross per 24 hour  Intake 2489.73 ml  Output 4055 ml  Net -1565.27 ml      Assessment/Plan:   77 y.o. female is s/p:  EVAR  1 Day Post-Op   -pt with brisk doppler flow bilateral PT and right radial.  Leave bandage on right wrist until tomorrow then ok to remove and shower.  -acute surgical blood loss anemia-tolerating -DVT prophylaxis:  sq heparin to start this am -PDMP reviewed-pt with prior rx for Vicodin -will d/c phenergan for prolonged QT interval -baby asa started yesterday-continue statin -f/u in 4-5 weeks with CTA a/p and see Dr. Chestine Spore.  Our office will arrange.  -discharge home after she has voided and walked.   Doreatha Massed, PA-C [facility] [phone] 04/21/2023 6:35 AM  I have seen and evaluated the patient. I agree with the PA note as documented above.  Postop day 1 status post aortobiiliac stent graft for abdominal aortic aneurysm.  Both groins look great.  Right DP palpable and left PT palpable.  No abdominal pain.  Plan discharge today.  Discussed follow-up in 1 month with CTA that we will arrange.  Discussed call with questions or concerns.  Cephus Shelling, MD [facility] Office: [phone]

## 2023-04-21 NOTE — Plan of Care (Signed)
  Problem: Education: Goal: Knowledge of General Education information will improve Description: Including pain rating scale, medication(s)/side effects and non-pharmacologic comfort measures Outcome: Progressing   Problem: Health Behavior/Discharge Planning: Goal: Ability to manage health-related needs will improve Outcome: Progressing   Problem: Clinical Measurements: Goal: Ability to maintain clinical measurements within normal limits will improve Outcome: Progressing Goal: Will remain free from infection Outcome: Progressing Goal: Diagnostic test results will improve Outcome: Progressing Goal: Respiratory complications will improve Outcome: Progressing Goal: Cardiovascular complication will be avoided Outcome: Progressing   Problem: Activity: Goal: Risk for activity intolerance will decrease Outcome: Progressing   Problem: Nutrition: Goal: Adequate nutrition will be maintained Outcome: Progressing   Problem: Coping: Goal: Level of anxiety will decrease Outcome: Progressing   Problem: Elimination: Goal: Will not experience complications related to bowel motility Outcome: Progressing Goal: Will not experience complications related to urinary retention Outcome: Progressing   Problem: Pain Managment: Goal: General experience of comfort will improve Outcome: Progressing   Problem: Safety: Goal: Ability to remain free from injury will improve Outcome: Progressing   Problem: Skin Integrity: Goal: Risk for impaired skin integrity will decrease Outcome: Progressing   Problem: Education: Goal: Knowledge of discharge needs will improve Outcome: Progressing   Problem: Clinical Measurements: Goal: Postoperative complications will be avoided or minimized Outcome: Progressing   Problem: Respiratory: Goal: Ability to achieve and maintain a regular respiratory rate will improve Outcome: Progressing   Problem: Skin Integrity: Goal: Demonstration of wound healing  without infection will improve Outcome: Progressing   

## 2023-04-21 NOTE — Discharge Summary (Signed)
EVAR Discharge Summary   [name] 02/18/1946 77 y.o. female  MRN: [id number]  Admission Date: 04/20/2023  Discharge Date: 04/21/2023  Physician: Cephus Shelling, MD  Admission Diagnosis: S/P abdominal aortic aneurysm repair [Z98.890, Z86.79] AAA (abdominal aortic aneurysm) (HCC) [I71.40]   HPI:   This is a 77 y.o. female  with history of COPD, hyperlipidemia as well as tobacco abuse that presents for follow-up after CTA for further evaluation of her abdominal aortic aneurysm.  She has been followed for a abdominal and celiac artery aneurysm by Dr. Hart Rochester.  On the last visit on 03/10/2023 her AAA had increased to 5.05 cm.  We sent her for CTA.   Cut back on smoking and down to 4-5 cigarettes a day.  No new abdominal pain.  Chronic back pain at baseline.  No new concerns today.  Her mother and grandmother died of AAA.  Hospital Course:  The patient was admitted to the hospital and taken to the operating room on 04/20/2023 and underwent: 1.  Ultrasound-guided access of bilateral common femoral arteries with delivery of Perclose closure device 2.  Abdominal aortogram with iliac arteriogram with catheter selection of aorta 3.  Repair of abdominal aortic aneurysm using endovascular aortobiiliac stent graft (cpt 40981 and 19147)    Findings: Ultrasound-guided access of bilateral common femoral arteries with delivery of Perclose closure device.  12 [place] Gore dry seal sheath was placed in the right common femoral artery and a 16 French Gore dry seal sheath was placed in the left common femoral artery.  Main body device went up the left side and was a 23 mm x 14 mm x 12 cm.  After the gate was cannulated we performed a bell bottom on the right into the common iliac with preservation of the hypogastric using a 20 mm x 11.5 cm limb.  On the left we performed a bell bottom into the left common iliac artery with preservation of the hypogastric using a 16 mm x 11.5 cm limb.  We did  have to reballoon the proximal stent graft after final angiogram showed a type Ia endoleak.  After we did this there was no endoleak at completion.   The pt tolerated the procedure well and was transported to the PACU in good condition.   By POD 1, pt doing well with brisk doppler flow bilateral PT and right radial.  Her groins are soft without hematoma.  She was able to ambulate and void.  She is discharged home.   CBC    Component Value Date/Time   WBC 11.1 (H) 04/21/2023 0315   RBC 3.57 (L) 04/21/2023 0315   HGB 11.9 (L) 04/21/2023 0315   HGB 15.1 11/08/2020 1420   HCT 34.2 (L) 04/21/2023 0315   HCT 43.6 11/08/2020 1420   PLT 174 04/21/2023 0315   PLT 227 11/08/2020 1420   MCV 95.8 04/21/2023 0315   MCV 98 (H) 11/08/2020 1420   MCH 33.3 04/21/2023 0315   MCHC 34.8 04/21/2023 0315   RDW 14.5 04/21/2023 0315   RDW 13.1 11/08/2020 1420   LYMPHSABS 2.3 11/08/2020 1420   MONOABS 0.9 01/07/2014 0526   EOSABS 0.2 11/08/2020 1420   BASOSABS 0.1 11/08/2020 1420    BMET    Component Value Date/Time   NA 135 04/21/2023 0315   NA 136 11/08/2020 1420   K 3.6 04/21/2023 0315   CL 101 04/21/2023 0315   CO2 23 04/21/2023 0315   GLUCOSE 139 (H) 04/21/2023 0315   BUN  12 04/21/2023 0315   BUN 12 11/08/2020 1420   CREATININE 0.73 04/21/2023 0315   CALCIUM 8.6 (L) 04/21/2023 0315   GFRNONAA >60 04/21/2023 0315   GFRAA 77 11/08/2020 1420       Discharge Instructions     Discharge patient   Complete by: As directed    Discharge home once she has eaten breakfast, walked, voided and been seen by Dr. Chestine Spore.  Thanks.   Discharge disposition: 01-Home or Self Care   Discharge patient date: 04/21/2023       Discharge Diagnosis:  S/P abdominal aortic aneurysm repair [P32.951, Z86.79] AAA (abdominal aortic aneurysm) (HCC) [I71.40]  Secondary Diagnosis: Patient Active Problem List   Diagnosis Date Noted   S/P abdominal aortic aneurysm repair 04/20/2023   AAA (abdominal aortic  aneurysm) (HCC) 04/20/2023   Acute low back pain with radicular symptoms, duration less than 6 weeks 02/13/2023   Diarrhea 09/16/2022   QT prolongation 05/23/2022   Mitral regurgitation, mild- moderate 02/28/2022   B12 deficiency 01/17/2022   Abnormal finding on MRI of brain 01/17/2022   Caregiver stress 09/06/2021   Aortic atherosclerosis (HCC) 03/07/2021   Opioid dependence (HCC) 03/07/2021   Migraine without aura and without status migrainosus, not intractable 04/10/2020   Hyperlipidemia    Headache 07/05/2019   AAA (abdominal aortic aneurysm) without rupture (HCC) 08/25/2016   Varicose veins of bilateral lower extremities with other complications 04/08/2016   Bony pelvic pain 02/04/2016   Chronic pain syndrome 08/09/2014   Vitamin D deficiency 05/10/2014   Routine general medical examination at a health care facility 05/10/2014   Lumbar radiculopathy 11/18/2013   Arthralgia 02/07/2013   Tobacco abuse 01/31/2013   Past Medical History:  Diagnosis Date   AAA (abdominal aortic aneurysm) (HCC)    saw Dr Hart Rochester 11/2013.  2.7 cm   Arthritis    Chicken pox    COPD (chronic obstructive pulmonary disease) (HCC)    smokes 1/2ppd   DDD (degenerative disc disease), lumbar    GERD (gastroesophageal reflux disease)    diet controlled   Headache    Hx of migraines   History of blood transfusion    History of lumbar fusion 08/09/2014   Hyperlipidemia    IBS (irritable bowel syndrome)    diet contolled   Mild mitral regurgitation    Peripheral vascular disease (HCC)    AAA   RBBB    S/P arthroscopy of right shoulder 12/15/2017   Seasonal allergies    Varicose veins      Allergies as of 04/21/2023       Reactions   Bee Venom    Other reaction(s): Redness   Cephalexin Other (See Comments)   Headaches   Gabapentin Diarrhea        Medication List     STOP taking these medications    promethazine 12.5 MG tablet Commonly known as: PHENERGAN       TAKE these  medications    amLODipine 5 MG tablet Commonly known as: NORVASC Take 1 tablet (5 mg total) by mouth daily.   aspirin EC 81 MG tablet Take 1 tablet (81 mg total) by mouth daily at 6 (six) AM. Swallow whole. Start taking on: April 22, 2023   atorvastatin 20 MG tablet Commonly known as: LIPITOR Take 1 tablet (20 mg total) by mouth daily.   cyanocobalamin 1000 MCG tablet Commonly known as: VITAMIN B12 Take 1 tablet (1,000 mcg total) by mouth daily.   cyclobenzaprine 5 MG tablet Commonly known  as: FLEXERIL Take 1 tablet (5 mg total) by mouth 3 (three) times daily as needed for muscle spasms.   diazepam 5 MG tablet Commonly known as: VALIUM TAKE ONE TABLET DAILY AS NEEDED FOR ANXIETY   Hair Skin & Nails Tabs Take 1 tablet by mouth daily.   HYDROcodone-acetaminophen 10-325 MG tablet Commonly known as: NORCO Take 1 tablet by mouth 3 (three) times daily as needed for moderate pain.   levocetirizine 5 MG tablet Commonly known as: XYZAL Take 5 mg by mouth daily as needed for allergies.   meloxicam 15 MG tablet Commonly known as: MOBIC TAKE ONE TABLET BY MOUTH DAILY   montelukast 10 MG tablet Commonly known as: SINGULAIR TAKE ONE TABLET AT BEDTIME   multivitamin tablet Take 1 tablet by mouth daily.   NEURIVA PLUS PO Take 1 capsule by mouth daily.        Discharge Instructions:  [facility]  Discharge Instructions Endovascular Aortic Aneurysm Repair  Please refer to the following instructions for your post-procedure care. Your surgeon or Physician Assistant will discuss any changes with you.  Activity  You are encouraged to walk as much as you can. You can slowly return to normal activities but must avoid strenuous activity and heavy lifting until your doctor tells you it's OK. Avoid activities such as vacuuming or swinging a gold club. It is normal to feel tired for several weeks after your surgery. Do not drive until your doctor  gives the OK and you are no longer taking prescription pain medications. It is also normal to have difficulty with sleep habits, eating, and bowel movements after surgery. These will go away with time.  Bathing/Showering  You may shower after you go home. If you have an incision, do not soak in a bathtub, hot tub, or swim until the incision heals completely.  Incision Care  Shower every day. Clean your incision with mild soap and water. Pat the area dry with a clean towel. You do not need a bandage unless otherwise instructed. Do not apply any ointments or creams to your incision. If you clothing is irritating, you may cover your incision with a dry gauze pad.  Diet  Resume your normal diet. There are no special food restrictions following this procedure. A low fat/low cholesterol diet is recommended for all patients with vascular disease. In order to heal from your surgery, it is CRITICAL to get adequate nutrition. Your body requires vitamins, minerals, and protein. Vegetables are the best source of vitamins and minerals. Vegetables also provide the perfect balance of protein. Processed food has little nutritional value, so try to avoid this.  Medications  Resume taking all of your medications unless your doctor or Physician Assistnat tells you not to. If your incision is causing pain, you may take over-the-counter pain relievers such as acetaminophen (Tylenol). If you were prescribed a stronger pain medication, please be aware these medications can cause nausea and constipation. Prevent nausea by taking the medication with a snack or meal. Avoid constipation by drinking plenty of fluids and eating foods with a high amount of fiber, such as fruits, vegetables, and grains.  Do not take Tylenol if you are taking prescription pain medications.   Follow up  Our office will schedule a follow-up appointment with a C.T. scan 3-4 weeks after your surgery.  Please call us immediately for any of the  following conditions  Severe or worsening pain in your legs or feet or in your abdomen back  or chest. Increased pain, redness, drainage (pus) from your incision sit. Increased abdominal pain, bloating, nausea, vomiting or persistent diarrhea. Fever of 101 degrees or higher. Swelling in your leg (s),  Reduce your risk of vascular disease  Stop smoking. If you would like help call [facility] at 1-800-QUIT-NOW (830 248 2340) or [place] at [phone]. Manage your cholesterol Maintain a desired weight Control your diabetes Keep your blood pressure down  If you have questions, please call the office at [phone].   Prescriptions given: 1. Aspirin 81mg  daily   (Phenergan discontinued due to prolonged QT interval)  Disposition: home  Patient's condition: is Good  Follow up: 1. Dr. Chestine Spore in 4 weeks with CTA protocol   Doreatha Massed, PA-C [facility] [phone] 04/21/2023  7:22 AM   - For VQI Registry use - Post-op:  Time to Extubation: [x]  In OR, [ ]  < 12 hrs, [ ]  12-24 hrs, [ ]  >=24 hrs Vasopressors Req. Post-op: No MI: No., [ ]  Troponin only, [ ]  EKG or Clinical New Arrhythmia: No CHF: No ICU Stay: 1 day in progressive Transfusion: No     If yes, n/a units given  Complications: Resp failure: No., [ ]  Pneumonia, [ ]  Ventilator Chg in renal function: No., [ ]  Inc. Cr > 0.5, [ ]  Temp. Dialysis,  [ ]  Permanent dialysis Leg ischemia: No., no Surgery needed, [ ]  Yes, Surgery needed,  [ ]  Amputation Bowel ischemia: No., [ ]  Medical Rx, [ ]  Surgical Rx Wound complication: No., [ ]  Superficial separation/infection, [ ]  Return to OR Return to OR: No  Return to OR for bleeding: No Stroke: No., [ ]  Minor, [ ]  Major  Discharge medications: Statin use:  Yes  ASA use:  Yes  Plavix use:  No  Beta blocker use:  No  ARB use:  No ACEI use:  No CCB use:  Yes

## 2023-04-21 NOTE — Anesthesia Postprocedure Evaluation (Signed)
Anesthesia Post Note  Patient: [name]  Procedure(s) Performed: ABDOMINAL AORTIC ENDOVASCULAR STENT GRAFT (Bilateral: Groin) ULTRASOUND GUIDANCE FOR VASCULAR ACCESS (Bilateral: Groin)     Patient location during evaluation: PACU Anesthesia Type: General Level of consciousness: sedated and patient cooperative Pain management: pain level controlled Vital Signs Assessment: post-procedure vital signs reviewed and stable Respiratory status: spontaneous breathing Cardiovascular status: stable Anesthetic complications: no   There were no known notable events for this encounter.  Last Vitals:  Vitals:   04/20/23 2300 04/21/23 0302  BP: 103/68 121/76  Pulse: 80 75  Resp: 15 11  Temp: 36.8 C 36.8 C  SpO2: 93% 92%    Last Pain:  Vitals:   04/21/23 0302  TempSrc: Oral  PainSc:                  Lewie Loron

## 2023-04-23 ENCOUNTER — Telehealth: Payer: Self-pay | Admitting: *Deleted

## 2023-04-23 ENCOUNTER — Encounter: Payer: Self-pay | Admitting: *Deleted

## 2023-04-23 NOTE — Transitions of Care (Post Inpatient/ED Visit) (Signed)
04/23/2023  Name: [name] MRN: [id number] DOB: 30-Aug-1946  Today's TOC FU Call Status: Today's TOC FU Call Status:: Successful TOC FU Call Competed TOC FU Call Complete Date: 04/23/23  Transition Care Management Follow-up Telephone Call Date of Discharge: 04/21/23 Discharge Facility: [facility]) Type of Discharge: Inpatient Admission Primary Inpatient Discharge Diagnosis:: surgical repair of AAA How have you been since you were released from the hospital?: Better ("I am doing just fine-- back to my normal independent self; not having any problems at all") Any questions or concerns?: No  Items Reviewed: Did you receive and understand the discharge instructions provided?: Yes (thoroughly reviewed with patient who verbalizes good understanding of same) Medications obtained,verified, and reconciled?: Yes (Medications Reviewed) (Full medication reconciliation/ review completed; no concerns or discrepancies identified; confirmed patient obtained/ is taking all newly Rx'd medications as instructed; self-manages medications and denies questions/ concerns around medications today) Any new allergies since your discharge?: No Dietary orders reviewed?: Yes Type of Diet Ordered:: "Regular" Reports "I have always eaten very healthy" Do you have support at home?: Yes People in Home: sibling(s) Name of Support/Comfort Primary Source: Reports independent in self-care activities; resides with supportive sister assists as/ if needed/ indicated  Medications Reviewed Today: Medications Reviewed Today     Reviewed by Michaela Corner, RN (Registered Nurse) on 04/23/23 at 1245  Med List Status: <None>   Medication Order Taking? Sig Documenting Provider Last Dose Status Informant  amLODipine (NORVASC) 5 MG tablet [id number] Yes Take 1 tablet (5 mg total) by mouth daily. Myrlene Broker, MD Taking Active Self  aspirin EC 81 MG tablet [id number] Yes Take 1 tablet (81 mg total) by mouth daily  at 6 (six) AM. Swallow whole. Dara Lords, PA-C Taking Active   atorvastatin (LIPITOR) 20 MG tablet [id number] Yes Take 1 tablet (20 mg total) by mouth daily. Myrlene Broker, MD Taking Active Self  Cobalamin Combinations (NEURIVA PLUS PO) [id number] Yes Take 1 capsule by mouth daily. [provider] Taking Active Self  cyclobenzaprine (FLEXERIL) 5 MG tablet [id number] Yes Take 1 tablet (5 mg total) by mouth 3 (three) times daily as needed for muscle spasms. Myrlene Broker, MD Taking Active Self  diazepam (VALIUM) 5 MG tablet [id number] Yes TAKE ONE TABLET DAILY AS NEEDED FOR ANXIETY Myrlene Broker, MD Taking Active Self  HYDROcodone-acetaminophen [facility]) 10-325 MG tablet [id number] Yes Take 1 tablet by mouth 3 (three) times daily as needed for moderate pain. [provider] Taking Active Self  levocetirizine (XYZAL) 5 MG tablet [id number] Yes Take 5 mg by mouth daily as needed for allergies. [provider] Taking Active Self  meloxicam (MOBIC) 15 MG tablet [id number] Yes TAKE ONE TABLET BY MOUTH DAILY Myrlene Broker, MD Taking Active   montelukast (SINGULAIR) 10 MG tablet [id number] Yes TAKE ONE TABLET AT BEDTIME Myrlene Broker, MD Taking Active   Multiple Vitamin (MULTIVITAMIN) tablet [id number] Yes Take 1 tablet by mouth daily. [provider] Taking Active Self  Multiple Vitamins-Minerals (HAIR SKIN & NAILS) TABS [id number] Yes Take 1 tablet by mouth daily. [provider] Taking Active Self  vitamin B-12 (CYANOCOBALAMIN) 1000 MCG tablet [id number] Yes Take 1 tablet (1,000 mcg total) by mouth daily. Pokhrel, Rebekah Chesterfield, MD Taking Active Self            Home Care and Equipment/Supplies: Were Home Health Services Ordered?: No Any new equipment or medical supplies ordered?: No  Functional Questionnaire: Do you need assistance with bathing/showering or  dressing?: No Do you need assistance with meal preparation?: No Do  you need assistance with eating?: No Do you have difficulty maintaining continence: No Do you need assistance with getting out of bed/getting out of a chair/moving?: No Do you have difficulty managing or taking your medications?: No  Follow up appointments reviewed: PCP Follow-up appointment confirmed?: NA (verified not indicated per hospital discharging provider discharge notes) Specialist Hospital Follow-up appointment confirmed?: Yes Date of Specialist follow-up appointment?: 05/26/23 (verified this is recommended time frame for follow up per hospital discharging provider notes) Follow-Up Specialty Provider:: surgical provider- Dr. Chestine Spore Do you need transportation to your follow-up appointment?: No Do you understand care options if your condition(s) worsen?: Yes-patient verbalized understanding  SDOH Interventions Today    Flowsheet Row Most Recent Value  SDOH Interventions   Food Insecurity Interventions Intervention Not Indicated  Transportation Interventions Intervention Not Indicated  [drives self]      TOC Interventions Today    Flowsheet Row Most Recent Value  TOC Interventions   TOC Interventions Discussed/Reviewed TOC Interventions Discussed, S/S of infection, Post op wound/incision care  [Patient declines need for ongoing/ further care coordination outreach,  no care coordination needs identified at time of TOC call today,  pat. declined taking my direct contact information should questions/ concerns/ needs arise post-TOC call]      Interventions Today    Flowsheet Row Most Recent Value  Chronic Disease   Chronic disease during today's visit Other  [surgical repair of AAA]  General Interventions   General Interventions Discussed/Reviewed General Interventions Discussed, Doctor Visits, Durable Medical Equipment (DME)  Doctor Visits Discussed/Reviewed Doctor Visits Discussed, PCP, Specialist  Durable Medical Equipment (DME) Other  [confirmed not currently requiring/  using assistive devices]  PCP/Specialist Visits Compliance with follow-up visit  Nutrition Interventions   Nutrition Discussed/Reviewed Nutrition Discussed  Pharmacy Interventions   Pharmacy Dicussed/Reviewed Pharmacy Topics Discussed  [Full medication review with updating medication list in EHR per patient report]      Caryl Pina, RN, BSN, CCRN Alumnus RN CM Care Coordination/ Transition of Care- [facility] Care Management [phone]: direct office

## 2023-04-24 ENCOUNTER — Encounter (HOSPITAL_COMMUNITY): Payer: Self-pay | Admitting: Vascular Surgery

## 2023-04-28 ENCOUNTER — Other Ambulatory Visit: Payer: Self-pay

## 2023-04-28 DIAGNOSIS — I7143 Infrarenal abdominal aortic aneurysm, without rupture: Secondary | ICD-10-CM

## 2023-05-11 DIAGNOSIS — M47817 Spondylosis without myelopathy or radiculopathy, lumbosacral region: Secondary | ICD-10-CM | POA: Diagnosis not present

## 2023-05-19 ENCOUNTER — Other Ambulatory Visit: Payer: 59

## 2023-05-20 ENCOUNTER — Ambulatory Visit
Admission: RE | Admit: 2023-05-20 | Discharge: 2023-05-20 | Disposition: A | Discharge status: Home / Self Care | Payer: 59 | Source: Ambulatory Visit | Attending: Vascular Surgery | Admitting: Vascular Surgery

## 2023-05-20 DIAGNOSIS — I7143 Infrarenal abdominal aortic aneurysm, without rupture: Secondary | ICD-10-CM | POA: Diagnosis not present

## 2023-05-20 MED ORDER — IOPAMIDOL (ISOVUE-370) INJECTION 76%
100.0000 mL | Freq: Once | INTRAVENOUS | Status: AC | PRN
  Administered 2023-05-20: 100 mL via INTRAVENOUS

## 2023-05-26 ENCOUNTER — Encounter: Payer: Self-pay | Admitting: Vascular Surgery

## 2023-05-26 ENCOUNTER — Ambulatory Visit: Payer: 59 | Admitting: Vascular Surgery

## 2023-05-26 VITALS — BP 132/85 | HR 99 | Temp 98.4°F | Resp 18 | Ht 66.0 in | Wt 112.7 lb

## 2023-05-26 DIAGNOSIS — I7143 Infrarenal abdominal aortic aneurysm, without rupture: Secondary | ICD-10-CM

## 2023-05-26 NOTE — Progress Notes (Signed)
Patient name: [name] MRN: [id number] DOB: 16-Jul-1946 Sex: female  REASON FOR CONSULT: Postop check s/p EVAR  HPI: [name] is a 77 y.o. female, with history of COPD, hyperlipidemia as well as tobacco abuse who presents for postop check after aortobiiliac stent graft.  Patient underwent EVAR on 04/20/2023 for 5.2 cm abdominal aortic aneurysm.  She was discharged without issues.  Today she has no complaints.  Groins are healed without issue.  No lower extremity complaints.    Past Medical History:  Diagnosis Date   AAA (abdominal aortic aneurysm) (HCC)    saw Dr Hart [place] 11/2013.  2.7 cm   Arthritis    Chicken pox    COPD (chronic obstructive pulmonary disease) (HCC)    smokes 1/2ppd   DDD (degenerative disc disease), lumbar    GERD (gastroesophageal reflux disease)    diet controlled   Headache    Hx of migraines   History of blood transfusion    History of lumbar fusion 08/09/2014   Hyperlipidemia    IBS (irritable bowel syndrome)    diet contolled   Mild mitral regurgitation    Peripheral vascular disease (HCC)    AAA   RBBB    S/P arthroscopy of right shoulder 12/15/2017   Seasonal allergies    Varicose veins     Past Surgical History:  Procedure Laterality Date   ABDOMINAL AORTIC ENDOVASCULAR STENT GRAFT Bilateral 04/20/2023   Procedure: ABDOMINAL AORTIC ENDOVASCULAR STENT GRAFT;  Surgeon: Cephus Shelling, MD;  Location: [facility] OR;  Service: Vascular;  Laterality: Bilateral;   APPENDECTOMY     BACK SURGERY N/A [phone]   5 back surgeries   BREAST BIOPSY  1989,05   lt br bx   BREAST EXCISIONAL BIOPSY Bilateral    CATARACT EXTRACTION W/ INTRAOCULAR LENS IMPLANT Bilateral 2018   CHOLECYSTECTOMY     COLONOSCOPY     EXCISION/RELEASE BURSA HIP Right 05/12/2013   Procedure: EXCISION RIGHT TROCHANTERIC BURSA/CALCIFICATION ;  Surgeon: Loreta Ave, MD;  Location: [facility];  Service: Orthopedics;  Laterality: Right;   FASCIOTOMY  Right 05/12/2013   Procedure: RIGHT FASCIOTOMY HIP ANY TYPE;  Surgeon: Loreta Ave, MD;  Location: [facility];  Service: Orthopedics;  Laterality: Right;   LARYNGOSCOPY AND BRONCHOSCOPY N/A 08/15/2019   Procedure: BRONCHOSCOPY/LARYNGOSCOPY;  Surgeon: Newman Pies, MD;  Location: [facility];  Service: ENT;  Laterality: N/A;   NM MYOCAR PERF WALL MOTION  04/13/2003   negative   SHOULDER ARTHROSCOPY WITH OPEN ROTATOR CUFF REPAIR Right 2018   skin cancer removed right leg     TONSILLECTOMY AND ADENOIDECTOMY  1950   TOTAL HIP ARTHROPLASTY Left 2006   lt total hip   TOTAL HIP ARTHROPLASTY Right 2011   TOTAL HIP ARTHROPLASTY Right 2016   second surgery due to recall   ULTRASOUND GUIDANCE FOR VASCULAR ACCESS Bilateral 04/20/2023   Procedure: ULTRASOUND GUIDANCE FOR VASCULAR ACCESS;  Surgeon: Cephus Shelling, MD;  Location: [facility] OR;  Service: Vascular;  Laterality: Bilateral;   US ECHOCARDIOGRAPHY  04/13/2003   EF 65%, mild TR w/suggestion of mild pulmonary hypertension,thickened anterior leaflet,central mild MR.   VAGINAL HYSTERECTOMY  1993    Family History  Problem Relation Age of Onset   Early death Mother        aortic aneurysm   Vasculitis Mother        temporal arteritis   Aortic aneurysm Mother    Heart disease Mother  before age 43   Hyperlipidemia Mother    Hypertension Mother    COPD Sister    Hypertension Sister    Depression Sister    Heart disease Sister        before age 43   Post-traumatic stress disorder Sister    Bipolar disorder Sister    Brain cancer Maternal Aunt 2       brain   Heart disease Maternal Grandmother        aortic aneurysm   Aortic aneurysm Maternal Grandmother    Hypertension Sister    Throat cancer Maternal Grandfather    Breast cancer Neg Hx     SOCIAL HISTORY: Social History   Socioeconomic History   Marital status: Legally Separated    Spouse name: Not on file   Number of children: Not on file    Years of education: Not on file   Highest education level: Not on file  Occupational History   Not on file  Tobacco Use   Smoking status: Every Day    Current packs/day: 0.75    Average packs/day: 0.8 packs/day for 30.0 years (22.5 ttl pk-yrs)    Types: Cigarettes   Smokeless tobacco: Never  Vaping Use   Vaping status: Never Used  Substance and Sexual Activity   Alcohol use: Not Currently    Alcohol/week: 0.0 standard drinks of alcohol    Comment: very occasional, not monthly   Drug use: No   Sexual activity: Not Currently  Other Topics Concern   Not on file  Social History Narrative   Not on file   Social Determinants of Health   Financial Resource Strain: Low Risk  (06/23/2022)   Overall Financial Resource Strain (CARDIA)    Difficulty of Paying Living Expenses: Not hard at all  Food Insecurity: No Food Insecurity (04/23/2023)   Hunger Vital Sign    Worried About Running Out of Food in the Last Year: Never true    Ran Out of Food in the Last Year: Never true  Transportation Needs: No Transportation Needs (04/23/2023)   PRAPARE - Administrator, Civil Service (Medical): No    Lack of Transportation (Non-Medical): No  Physical Activity: Insufficiently Active (06/23/2022)   Exercise Vital Sign    Days of Exercise per Week: 3 days    Minutes of Exercise per Session: 30 min  Stress: No Stress Concern Present (06/23/2022)   [employer] of Occupational Health - Occupational Stress Questionnaire    Feeling of Stress : Not at all  Social Connections: Socially Isolated (06/23/2022)   Social Connection and Isolation Panel [NHANES]    Frequency of Communication with Friends and Family: More than three times a week    Frequency of Social Gatherings with Friends and Family: More than three times a week    Attends Religious Services: Never    Database administrator or Organizations: No    Attends Banker Meetings: Never    Marital Status: Divorced   Catering manager Violence: Not At Risk (06/23/2022)   Humiliation, Afraid, Rape, and Kick questionnaire    Fear of Current or Ex-Partner: No    Emotionally Abused: No    Physically Abused: No    Sexually Abused: No    Allergies  Allergen Reactions   Bee Venom     Other reaction(s): Redness   Cephalexin Other (See Comments)    Headaches   Gabapentin Diarrhea    Current Outpatient Medications  Medication Sig Dispense Refill  amLODipine (NORVASC) 5 MG tablet Take 1 tablet (5 mg total) by mouth daily. 90 tablet 3   aspirin EC 81 MG tablet Take 1 tablet (81 mg total) by mouth daily at 6 (six) AM. Swallow whole.     atorvastatin (LIPITOR) 20 MG tablet Take 1 tablet (20 mg total) by mouth daily. 90 tablet 3   Cobalamin Combinations (NEURIVA PLUS PO) Take 1 capsule by mouth daily.     Cobalamin Combinations (NEURIVA PLUS) CHEW Chew by mouth.     cyclobenzaprine (FLEXERIL) 5 MG tablet Take 1 tablet (5 mg total) by mouth 3 (three) times daily as needed for muscle spasms. 180 tablet 3   diazepam (VALIUM) 5 MG tablet TAKE ONE TABLET DAILY AS NEEDED FOR ANXIETY 30 tablet 1   HYDROcodone-acetaminophen (NORCO) 10-325 MG tablet Take 1 tablet by mouth 3 (three) times daily as needed for moderate pain.     levocetirizine (XYZAL) 5 MG tablet Take 5 mg by mouth daily as needed for allergies.     meloxicam (MOBIC) 15 MG tablet TAKE ONE TABLET BY MOUTH DAILY 30 tablet 1   montelukast (SINGULAIR) 10 MG tablet TAKE ONE TABLET AT BEDTIME 90 tablet 1   Multiple Vitamin (MULTIVITAMIN) tablet Take 1 tablet by mouth daily.     Multiple Vitamins-Minerals (HAIR SKIN & NAILS) TABS Take 1 tablet by mouth daily.     vitamin B-12 (CYANOCOBALAMIN) 1000 MCG tablet Take 1 tablet (1,000 mcg total) by mouth daily. 100 tablet 2   No current facility-administered medications for this visit.    REVIEW OF SYSTEMS:  [X]  denotes positive finding, [ ]  denotes negative finding Cardiac  Comments:  Chest pain or chest  pressure:    Shortness of breath upon exertion:    Short of breath when lying flat:    Irregular heart rhythm:        Vascular    Pain in calf, thigh, or hip brought on by ambulation:    Pain in feet at night that wakes you up from your sleep:     Blood clot in your veins:    Leg swelling:         Pulmonary    Oxygen at home:    Productive cough:     Wheezing:         Neurologic    Sudden weakness in arms or legs:     Sudden numbness in arms or legs:     Sudden onset of difficulty speaking or slurred speech:    Temporary loss of vision in one eye:     Problems with dizziness:         Gastrointestinal    Blood in stool:     Vomited blood:         Genitourinary    Burning when urinating:     Blood in urine:        Psychiatric    Major depression:         Hematologic    Bleeding problems:    Problems with blood clotting too easily:        Skin    Rashes or ulcers:        Constitutional    Fever or chills:      PHYSICAL EXAM: Vitals:   05/26/23 1425 05/26/23 1427  BP: 128/78 132/85  Pulse: 99   Resp: 18   Temp: 98.4 F (36.9 C)   TempSrc: Temporal   SpO2: 92%   Weight: 112 lb 11.2 oz (  51.1 kg)   Height: 5\' 6"  (1.676 m)     GENERAL: The patient is a well-nourished female, in no acute distress. The vital signs are documented above. CARDIAC: There is a regular rate and rhythm.  VASCULAR:  Bilateral femoral pulses palpable Right DP palpable Left PT palpable Both groins healed, no hematoma ABDOMEN: Soft and non-tender. MUSCULOSKELETAL: There are no major deformities or cyanosis. NEUROLOGIC: No focal weakness or paresthesias are detected. PSYCHIATRIC: The patient has a normal affect.  DATA:   CTA reviewed from 05/20/2023.  Stent graft in good position.  I do think there is good seal proximally with no evidence of a type I endoleak.  Type II endoleak from lumbars.  Both limbs widely patent.  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  Assessment/Plan:  77 year old female presents for postop check after EVAR on 04/20/2023 for 5.2 cm abdominal aortic aneurysm.  Overall she is doing well.  Her groins have healed without issue.  She has palpable femoral and pedal pulses.  I discussed that her stent graft is in good position.  She does have a type II endoleak and we will need to follow with surveillance.  I have discussed repeat ultrasound in 6 months in the office with a EVAR duplex.  Will ensure there is no aneurysm sac growth and otherwise suggest continued observation for the type II endoleak.  I do think the graft has a good seal proximally.  I will also monitor right external iliac dissection.  This is not flow-limiting and she has palpable common femoral and dorsalis pedis pulse on the right leg and has no symptoms.  I discussed this is from retrograde sheath access for delivery of endograft.   Cephus Shelling, MD [facility] Office: [phone]

## 2023-06-12 ENCOUNTER — Other Ambulatory Visit: Payer: Self-pay

## 2023-06-12 DIAGNOSIS — I7143 Infrarenal abdominal aortic aneurysm, without rupture: Secondary | ICD-10-CM

## 2023-06-15 ENCOUNTER — Ambulatory Visit (INDEPENDENT_AMBULATORY_CARE_PROVIDER_SITE_OTHER): Payer: 59

## 2023-06-15 ENCOUNTER — Ambulatory Visit (INDEPENDENT_AMBULATORY_CARE_PROVIDER_SITE_OTHER): Payer: 59 | Admitting: Internal Medicine

## 2023-06-15 ENCOUNTER — Encounter: Payer: Self-pay | Admitting: Internal Medicine

## 2023-06-15 VITALS — BP 116/80 | HR 104 | Temp 97.9°F | Ht 66.0 in | Wt 113.4 lb

## 2023-06-15 DIAGNOSIS — R053 Chronic cough: Secondary | ICD-10-CM | POA: Diagnosis not present

## 2023-06-15 DIAGNOSIS — I10 Essential (primary) hypertension: Secondary | ICD-10-CM

## 2023-06-15 DIAGNOSIS — R5383 Other fatigue: Secondary | ICD-10-CM | POA: Diagnosis not present

## 2023-06-15 DIAGNOSIS — R059 Cough, unspecified: Secondary | ICD-10-CM | POA: Diagnosis not present

## 2023-06-15 DIAGNOSIS — E559 Vitamin D deficiency, unspecified: Secondary | ICD-10-CM

## 2023-06-15 DIAGNOSIS — Z95828 Presence of other vascular implants and grafts: Secondary | ICD-10-CM | POA: Diagnosis not present

## 2023-06-15 DIAGNOSIS — I700 Atherosclerosis of aorta: Secondary | ICD-10-CM | POA: Diagnosis not present

## 2023-06-15 DIAGNOSIS — E538 Deficiency of other specified B group vitamins: Secondary | ICD-10-CM

## 2023-06-15 LAB — CBC WITH DIFFERENTIAL/PLATELET
Basophils Absolute: 0.1 10*3/uL (ref 0.0–0.1)
Basophils Relative: 1.1 % (ref 0.0–3.0)
Eosinophils Absolute: 0.1 10*3/uL (ref 0.0–0.7)
Eosinophils Relative: 1.6 % (ref 0.0–5.0)
HCT: 43.2 % (ref 36.0–46.0)
Hemoglobin: 14.2 g/dL (ref 12.0–15.0)
Lymphocytes Relative: 21.4 % (ref 12.0–46.0)
Lymphs Abs: 2 10*3/uL (ref 0.7–4.0)
MCHC: 32.9 g/dL (ref 30.0–36.0)
MCV: 100 fl (ref 78.0–100.0)
Monocytes Absolute: 1 10*3/uL (ref 0.1–1.0)
Monocytes Relative: 10.5 % (ref 3.0–12.0)
Neutro Abs: 6.1 10*3/uL (ref 1.4–7.7)
Neutrophils Relative %: 65.4 % (ref 43.0–77.0)
Platelets: 249 10*3/uL (ref 150.0–400.0)
RBC: 4.31 Mil/uL (ref 3.87–5.11)
RDW: 14.3 % (ref 11.5–15.5)
WBC: 9.3 10*3/uL (ref 4.0–10.5)

## 2023-06-15 LAB — COMPREHENSIVE METABOLIC PANEL
ALT: 13 U/L (ref 0–35)
AST: 25 U/L (ref 0–37)
Albumin: 4.3 g/dL (ref 3.5–5.2)
Alkaline Phosphatase: 85 U/L (ref 39–117)
BUN: 18 mg/dL (ref 6–23)
CO2: 30 meq/L (ref 19–32)
Calcium: 9.5 mg/dL (ref 8.4–10.5)
Chloride: 97 meq/L (ref 96–112)
Creatinine, Ser: 0.8 mg/dL (ref 0.40–1.20)
GFR: 70.33 mL/min (ref >60.00)
Glucose, Bld: 75 mg/dL (ref 70–99)
Potassium: 3.8 meq/L (ref 3.5–5.1)
Sodium: 136 meq/L (ref 135–145)
Total Bilirubin: 0.5 mg/dL (ref 0.2–1.2)
Total Protein: 7.3 g/dL (ref 6.0–8.3)

## 2023-06-15 LAB — VITAMIN D 25 HYDROXY (VIT D DEFICIENCY, FRACTURES): VITD: 49.02 ng/mL (ref 30.00–100.00)

## 2023-06-15 LAB — VITAMIN B12: Vitamin B-12: 614 pg/mL (ref 211–911)

## 2023-06-15 LAB — TSH: TSH: 0.97 u[IU]/mL (ref 0.35–5.50)

## 2023-06-15 NOTE — Patient Instructions (Addendum)
      Blood work was ordered.   Have a chest xray downstairs.      Medications changes include :  none       Return if symptoms worsen or fail to improve.

## 2023-06-15 NOTE — Progress Notes (Signed)
Subjective:    Patient ID: [name], female    DOB: 10-04-1945, 77 y.o.   MRN: [id number]      HPI [name] is here for  Chief Complaint  Patient presents with   Fatigue    No energy and no appetite at all; Hasn't eaten a good meal x 1 month     For at least one month - she has had no appetite.  She has been drinking ensure 2-3 per day for about one month because she can not eat.  She will cook-likes to cook, but then has no desire to eat the food.  The only solid food she has eaten in the past few weeks is 2 donuts which tasted okay to work.  The end of July she had an aneurysm repair.  Her appetite was normal prior to the surgery and just the surgery it was normal.  At some point the appetite was just gone. She took mobic just after the surgery, but is no longer taking it.  There have been no other medication changes.   No viral illnesses.  No issues with anesthesia in past causing decrease in appetite.  There has been no other changes that she can think of that would be contributing to her lack of appetite  She denies other concerning symptoms, except for fatigue    Medications and allergies reviewed with patient and updated if appropriate.  Current Outpatient Medications on File Prior to Visit  Medication Sig Dispense Refill   amLODipine (NORVASC) 5 MG tablet Take 1 tablet (5 mg total) by mouth daily. 90 tablet 3   aspirin EC 81 MG tablet Take 1 tablet (81 mg total) by mouth daily at 6 (six) AM. Swallow whole.     atorvastatin (LIPITOR) 20 MG tablet Take 1 tablet (20 mg total) by mouth daily. 90 tablet 3   Cobalamin Combinations (NEURIVA PLUS PO) Take 1 capsule by mouth daily.     Cobalamin Combinations (NEURIVA PLUS) CHEW Chew by mouth.     cyclobenzaprine (FLEXERIL) 5 MG tablet Take 1 tablet (5 mg total) by mouth 3 (three) times daily as needed for muscle spasms. 180 tablet 3   diazepam (VALIUM) 5 MG tablet TAKE ONE TABLET DAILY AS NEEDED FOR ANXIETY 30 tablet 1    HYDROcodone-acetaminophen (NORCO) 10-325 MG tablet Take 1 tablet by mouth 3 (three) times daily as needed for moderate pain.     levocetirizine (XYZAL) 5 MG tablet Take 5 mg by mouth daily as needed for allergies.     meloxicam (MOBIC) 15 MG tablet TAKE ONE TABLET BY MOUTH DAILY 30 tablet 1   montelukast (SINGULAIR) 10 MG tablet TAKE ONE TABLET AT BEDTIME 90 tablet 1   Multiple Vitamin (MULTIVITAMIN) tablet Take 1 tablet by mouth daily.     Multiple Vitamins-Minerals (HAIR SKIN & NAILS) TABS Take 1 tablet by mouth daily.     vitamin B-12 (CYANOCOBALAMIN) 1000 MCG tablet Take 1 tablet (1,000 mcg total) by mouth daily. 100 tablet 2   No current facility-administered medications on file prior to visit.    Review of Systems  Constitutional:  Positive for fatigue. Negative for chills and fever.  HENT:  Negative for trouble swallowing.   Respiratory:  Negative for cough, shortness of breath and wheezing.   Cardiovascular:  Negative for chest pain, palpitations and leg swelling.  Gastrointestinal:  Negative for abdominal pain, blood in stool, constipation, diarrhea (IBS - BM vary - chronic - no change) and nausea.  No gerd  Genitourinary:  Negative for dysuria and hematuria.  Skin:  Negative for rash.  Neurological:  Negative for light-headedness and headaches.  Psychiatric/Behavioral:  Positive for sleep disturbance (chronic - no new). Negative for dysphoric mood. The patient is not nervous/anxious.        Objective:   Vitals:   06/15/23 1523  BP: 116/80  Pulse: (!) 104  Temp: 97.9 F (36.6 C)  SpO2: 91%   BP Readings from Last 3 Encounters:  06/15/23 116/80  05/26/23 132/85  04/21/23 137/83   Wt Readings from Last 3 Encounters:  06/15/23 113 lb 6.4 oz (51.4 kg)  05/26/23 112 lb 11.2 oz (51.1 kg)  04/20/23 108 lb (49 kg)   Body mass index is 18.3 kg/m.    Physical Exam Constitutional:      General: She is not in acute distress.    Appearance: Normal appearance.   HENT:     Head: Normocephalic and atraumatic.  Eyes:     Conjunctiva/sclera: Conjunctivae normal.  Cardiovascular:     Rate and Rhythm: Normal rate and regular rhythm.     Heart sounds: Normal heart sounds.  Pulmonary:     Effort: Pulmonary effort is normal. No respiratory distress.     Breath sounds: Normal breath sounds. No wheezing.  Abdominal:     General: There is no distension.     Palpations: Abdomen is soft.     Tenderness: There is no abdominal tenderness.  Musculoskeletal:     Cervical back: Neck supple.     Right lower leg: No edema.     Left lower leg: No edema.  Lymphadenopathy:     Cervical: No cervical adenopathy.  Skin:    General: Skin is warm and dry.     Findings: No rash.  Neurological:     Mental Status: She is alert. Mental status is at baseline.  Psychiatric:        Mood and Affect: Mood normal.        Behavior: Behavior normal.            Assessment & Plan:    Fatigue, decreased appetite: New Started at some point in the last 6 months She did have surgery-aneurysm repair-appetite was normal prior to the surgery and immediately after was okay, but shortly after that at some point her appetite was gone ?  Related to anesthesia-states she never had any issues in the past with anesthesia She denies viral illnesses Denies depression, anxiety Will check blood work including B12, vitamin D, CBC, CMP, TSH Recent chest x-ray did show some prominence in her mediastinum-will repeat chest x-ray  Vitamin B12 deficiency, vitamin D deficiency: Taking both supplements routinely Check levels of both  Chronic cough: Chronic smoker-no desire to quit Recent chest x-ray did show a prominent mediastinum Recheck chest x-ray today  Hypertension: Blood pressure is well-controlled Continue amlodipine 5 mg daily

## 2023-06-17 MED ORDER — MIRTAZAPINE 7.5 MG PO TABS: 7.5000 mg | ORAL_TABLET | Freq: Every day | ORAL | 0 refills | Status: DC

## 2023-06-17 NOTE — Addendum Note (Signed)
Addended by: Pincus Sanes on: 06/17/2023 09:30 PM   Modules accepted: Orders

## 2023-06-20 ENCOUNTER — Other Ambulatory Visit: Payer: Self-pay | Admitting: Internal Medicine

## 2023-06-24 ENCOUNTER — Ambulatory Visit: Payer: 59 | Admitting: *Deleted

## 2023-06-24 DIAGNOSIS — Z0000 Encounter for general adult medical examination without abnormal findings: Secondary | ICD-10-CM

## 2023-06-24 NOTE — Progress Notes (Signed)
Subjective:   [name] is a 77 y.o. female who presents for Medicare Annual (Subsequent) preventive examination.  Visit Complete: Virtual  I connected with  [name] on 06/24/23 by a audio enabled telemedicine application and verified that I am speaking with the correct person using two identifiers.  Patient Location: Home  Provider Location: Home Office  I discussed the limitations of evaluation and management by telemedicine. The patient expressed understanding and agreed to proceed.  Because this visit was a virtual/telehealth visit, some criteria may be missing or patient reported. Any vitals not documented were not able to be obtained and vitals that have been documented are patient reported.    Cardiac Risk Factors include: advanced age (>47men, >60 women);hypertension;smoking/ tobacco exposure;family history of premature cardiovascular disease     Objective:    Today's Vitals   06/24/23 1546  PainSc: 7    There is no height or weight on file to calculate BMI.     06/24/2023    3:49 PM 04/14/2023   11:23 AM 06/23/2022   11:28 AM 01/15/2022   12:12 PM 06/19/2021    5:25 PM 03/13/2020    8:23 AM 08/15/2019    7:47 AM  Advanced Directives  Does Patient Have a Medical Advance Directive? Yes Yes Yes Yes Yes Yes Yes  Type of Estate agent of [employer] Power of [place];Living will Healthcare Power of [place];Living will Healthcare Power of [place];Living will Healthcare Power of [place];Living will;Out of facility DNR (pink MOST or yellow form) Healthcare Power of [place];Living will Healthcare Power of [place];Living will  Does patient want to make changes to medical advance directive?      No - Patient declined No - Patient declined  Copy of Healthcare Power of Attorney in Chart? Yes - validated most recent copy scanned in chart (See row information) No - copy requested No - copy requested  No - copy requested  No - copy  requested    Current Medications (verified) Outpatient Encounter Medications as of 06/24/2023  Medication Sig   amLODipine (NORVASC) 5 MG tablet Take 1 tablet (5 mg total) by mouth daily.   aspirin EC 81 MG tablet Take 1 tablet (81 mg total) by mouth daily at 6 (six) AM. Swallow whole.   atorvastatin (LIPITOR) 20 MG tablet Take 1 tablet (20 mg total) by mouth daily.   Cobalamin Combinations (NEURIVA PLUS PO) Take 1 capsule by mouth daily.   Cobalamin Combinations (NEURIVA PLUS) CHEW Chew by mouth.   cyclobenzaprine (FLEXERIL) 5 MG tablet Take 1 tablet (5 mg total) by mouth 3 (three) times daily as needed for muscle spasms.   diazepam (VALIUM) 5 MG tablet TAKE ONE TABLET DAILY AS NEEDED FOR ANXIETY   HYDROcodone-acetaminophen (NORCO) 10-325 MG tablet Take 1 tablet by mouth 3 (three) times daily as needed for moderate pain.   levocetirizine (XYZAL) 5 MG tablet Take 5 mg by mouth daily as needed for allergies.   mirtazapine (REMERON) 7.5 MG tablet Take 1 tablet (7.5 mg total) by mouth at bedtime.   montelukast (SINGULAIR) 10 MG tablet TAKE ONE TABLET AT BEDTIME   Multiple Vitamin (MULTIVITAMIN) tablet Take 1 tablet by mouth daily.   Multiple Vitamins-Minerals (HAIR SKIN & NAILS) TABS Take 1 tablet by mouth daily.   vitamin B-12 (CYANOCOBALAMIN) 1000 MCG tablet Take 1 tablet (1,000 mcg total) by mouth daily.   No facility-administered encounter medications on file as of 06/24/2023.    Allergies (verified) Bee venom, Cephalexin, and  Gabapentin   History: Past Medical History:  Diagnosis Date   AAA (abdominal aortic aneurysm) (HCC)    saw Dr Hart [place] 11/2013.  2.7 cm   Arthritis    Chicken pox    COPD (chronic obstructive pulmonary disease) (HCC)    smokes 1/2ppd   DDD (degenerative disc disease), lumbar    GERD (gastroesophageal reflux disease)    diet controlled   Headache    Hx of migraines   History of blood transfusion    History of lumbar fusion 08/09/2014   Hyperlipidemia     IBS (irritable bowel syndrome)    diet contolled   Mild mitral regurgitation    Peripheral vascular disease (HCC)    AAA   RBBB    S/P arthroscopy of right shoulder 12/15/2017   Seasonal allergies    Varicose veins    Past Surgical History:  Procedure Laterality Date   ABDOMINAL AORTIC ENDOVASCULAR STENT GRAFT Bilateral 04/20/2023   Procedure: ABDOMINAL AORTIC ENDOVASCULAR STENT GRAFT;  Surgeon: Cephus Shelling, MD;  Location: [facility] OR;  Service: Vascular;  Laterality: Bilateral;   APPENDECTOMY     BACK SURGERY N/A [phone]   5 back surgeries   BREAST BIOPSY  1989,05   lt br bx   BREAST EXCISIONAL BIOPSY Bilateral    CATARACT EXTRACTION W/ INTRAOCULAR LENS IMPLANT Bilateral 2018   CHOLECYSTECTOMY     COLONOSCOPY     EXCISION/RELEASE BURSA HIP Right 05/12/2013   Procedure: EXCISION RIGHT TROCHANTERIC BURSA/CALCIFICATION ;  Surgeon: Loreta Ave, MD;  Location: [facility];  Service: Orthopedics;  Laterality: Right;   FASCIOTOMY Right 05/12/2013   Procedure: RIGHT FASCIOTOMY HIP ANY TYPE;  Surgeon: Loreta Ave, MD;  Location: [facility];  Service: Orthopedics;  Laterality: Right;   LARYNGOSCOPY AND BRONCHOSCOPY N/A 08/15/2019   Procedure: BRONCHOSCOPY/LARYNGOSCOPY;  Surgeon: Newman Pies, MD;  Location: [facility];  Service: ENT;  Laterality: N/A;   NM MYOCAR PERF WALL MOTION  04/13/2003   negative   SHOULDER ARTHROSCOPY WITH OPEN ROTATOR CUFF REPAIR Right 2018   skin cancer removed right leg     TONSILLECTOMY AND ADENOIDECTOMY  1950   TOTAL HIP ARTHROPLASTY Left 2006   lt total hip   TOTAL HIP ARTHROPLASTY Right 2011   TOTAL HIP ARTHROPLASTY Right 2016   second surgery due to recall   ULTRASOUND GUIDANCE FOR VASCULAR ACCESS Bilateral 04/20/2023   Procedure: ULTRASOUND GUIDANCE FOR VASCULAR ACCESS;  Surgeon: Cephus Shelling, MD;  Location[facility];  Service: Vascular;  Laterality: Bilateral;   US ECHOCARDIOGRAPHY  04/13/2003    EF 65%, mild TR w/suggestion of mild pulmonary hypertension,thickened anterior leaflet,central mild MR.   VAGINAL HYSTERECTOMY  1993   Family History  Problem Relation Age of Onset   Early death Mother        aortic aneurysm   Vasculitis Mother        temporal arteritis   Aortic aneurysm Mother    Heart disease Mother        before age 22   Hyperlipidemia Mother    Hypertension Mother    COPD Sister    Hypertension Sister    Depression Sister    Heart disease Sister        before age 46   Post-traumatic stress disorder Sister    Bipolar disorder Sister    Brain cancer Maternal Aunt 17       brain   Heart disease Maternal Grandmother  aortic aneurysm   Aortic aneurysm Maternal Grandmother    Hypertension Sister    Throat cancer Maternal Grandfather    Breast cancer Neg Hx    Social History   Socioeconomic History   Marital status: Legally Separated    Spouse name: Not on file   Number of children: Not on file   Years of education: Not on file   Highest education level: Not on file  Occupational History   Not on file  Tobacco Use   Smoking status: Every Day    Current packs/day: 0.75    Average packs/day: 0.8 packs/day for 30.0 years (22.5 ttl pk-yrs)    Types: Cigarettes   Smokeless tobacco: Never  Vaping Use   Vaping status: Never Used  Substance and Sexual Activity   Alcohol use: Not Currently    Alcohol/week: 0.0 standard drinks of alcohol    Comment: very occasional, not monthly   Drug use: No   Sexual activity: Not Currently  Other Topics Concern   Not on file  Social History Narrative   Not on file   Social Determinants of Health   Financial Resource Strain: Low Risk  (06/23/2022)   Overall Financial Resource Strain (CARDIA)    Difficulty of Paying Living Expenses: Not hard at all  Food Insecurity: No Food Insecurity (06/24/2023)   Hunger Vital Sign    Worried About Running Out of Food in the Last Year: Never true    Ran Out of Food in  the Last Year: Never true  Transportation Needs: No Transportation Needs (06/24/2023)   PRAPARE - Administrator, Civil Service (Medical): No    Lack of Transportation (Non-Medical): No  Physical Activity: Inactive (06/24/2023)   Exercise Vital Sign    Days of Exercise per Week: 0 days    Minutes of Exercise per Session: 0 min  Stress: No Stress Concern Present (06/23/2022)   [employer] of Occupational Health - Occupational Stress Questionnaire    Feeling of Stress : Not at all  Social Connections: Socially Isolated (06/24/2023)   Social Connection and Isolation Panel [NHANES]    Frequency of Communication with Friends and Family: Three times a week    Frequency of Social Gatherings with Friends and Family: Once a week    Attends Religious Services: Never    Database administrator or Organizations: No    Attends Engineer, structural: Never    Marital Status: Separated    Tobacco Counseling Ready to quit: Not Answered Counseling given: Not Answered   Clinical Intake:  Pre-visit preparation completed: Yes  Pain : 0-10 Pain Score: 7  Pain Type: Chronic pain Pain Location: Back Pain Descriptors / Indicators: Constant, Burning, Aching, Dull Pain Onset: More than a month ago Pain Frequency: Constant Pain Relieving Factors: hydrocodone  Pain Relieving Factors: hydrocodone  Diabetes: No  How often do you need to have someone help you when you read instructions, pamphlets, or other written materials from your doctor or pharmacy?: 1 - Never  Interpreter Needed?: No  Information entered by :: Remi Haggard LPN   Activities of Daily Living    06/24/2023    3:50 PM 04/14/2023   11:25 AM  In your present state of health, do you have any difficulty performing the following activities:  Hearing? 0   Vision? 0   Difficulty concentrating or making decisions? 0   Walking or climbing stairs? 1   Dressing or bathing? 0   Doing errands, shopping? 0 0  Preparing Food and eating ? N   Using the Toilet? N   In the past six months, have you accidently leaked urine? Y   Do you have problems with loss of bowel control? N   Managing your Medications? N   Managing your Finances? N   Housekeeping or managing your Housekeeping? N     Patient Care Team: Myrlene Broker, MD as PCP - General (Internal Medicine) Hilda Lias, MD as Consulting Physician (Neurosurgery) Odette Fraction, MD (Inactive) as Consulting Physician (Pain Medicine) Sharrell Ku, MD as Consulting Physician (Gastroenterology) Vascular & Ve[facility] (Vascular Surgery)  Indicate any recent Medical Services you may have received from other than [facility] providers in the past year (date may be approximate).     Assessment:   This is a routine wellness examination for Aalyha.  Hearing/Vision screen Hearing Screening - Comments:: No trouble hearing Vision Screening - Comments:: Had lens implants    Goals Addressed             This Visit's Progress    Patient Stated       Go would like to travel to [facility] to see her sister       Depression Screen    06/24/2023    3:52 PM 03/23/2023    1:05 PM 02/10/2023    2:11 PM 09/16/2022    2:31 PM 06/23/2022   11:27 AM 05/22/2022   11:13 AM 03/07/2022    1:56 PM  PHQ 2/9 Scores  PHQ - 2 Score 0 0 0 0 0 0 0  PHQ- 9 Score 5 0 0 0 0 2 0    Fall Risk    06/24/2023    3:50 PM 03/23/2023    1:05 PM 02/10/2023    2:11 PM 09/16/2022    2:31 PM 06/23/2022   11:26 AM  Fall Risk   Falls in the past year? 1 0 1 0 0  Number falls in past yr: 0 0 0 0 0  Injury with Fall? 0 0 1 0 0  Risk for fall due to :     No Fall Risks  Follow up Falls evaluation completed;Education provided;Falls prevention discussed Falls evaluation completed Falls evaluation completed Falls evaluation completed Falls prevention discussed    MEDICARE RISK AT HOME: Medicare Risk at Home Any stairs in or around the home?: No If so, are there  any without handrails?: No Home free of loose throw rugs in walkways, pet beds, electrical cords, etc?: Yes Adequate lighting in your home to reduce risk of falls?: Yes Life alert?: No Use of a cane, walker or w/c?: No Grab bars in the bathroom?: Yes Shower chair or bench in shower?: Yes Elevated toilet seat or a handicapped toilet?: Yes  TIMED UP AND GO:  Was the test performed?  No    Cognitive Function:        06/24/2023    3:53 PM 06/23/2022   11:29 AM 06/19/2021    5:23 PM 03/13/2020    8:25 AM  6CIT Screen  What Year? 0 points 0 points 0 points 0 points  What month? 0 points 0 points 0 points 0 points  What time? 0 points 0 points 0 points 0 points  Count back from 20 0 points 0 points 0 points 0 points  Months in reverse 0 points 0 points 0 points 0 points  Repeat phrase 0 points 2 points 8 points 0 points  Total Score 0 points 2 points 8 points 0 points  Immunizations Immunization History  Administered Date(s) Administered   Fluad Quad(high Dose 65+) 07/27/2017, 06/29/2018, 06/11/2019, 06/24/2022   Influenza, High Dose Seasonal PF 07/27/2017, 06/29/2018, 06/18/2021   Influenza, Seasonal, Injecte, Preservative Fre 07/04/2014, 08/09/2015   Influenza-Unspecified 07/04/2014, 08/09/2015, 07/27/2017, 06/29/2018, 06/13/2019, 07/16/2020   Moderna Covid-19 Vaccine Bivalent Booster 35yrs & up 06/18/2021   Moderna Sars-Covid-2 Vaccination 11/28/2019, 12/26/2019, 05/21/2020, 01/08/2021   PNEUMOCOCCAL CONJUGATE-20 03/07/2021   Pfizer Covid-19 Vaccine Bivalent Booster 7yrs & up 07/16/2022   Pneumococcal Conjugate-13 05/10/2014, 08/09/2015   Pneumococcal Polysaccharide-23 04/02/2017   Respiratory Syncytial Virus Vaccine,Recomb Aduvanted(Arexvy) 06/24/2022   Tdap 01/31/2013   Zoster Recombinant(Shingrix) 12/04/2016, 07/11/2019, 09/27/2019   Zoster, Live 07/04/2014    TDAP status: Due, Education has been provided regarding the importance of this vaccine. Advised may receive  this vaccine at local pharmacy or Health Dept. Aware to provide a copy of the vaccination record if obtained from local pharmacy or Health Dept. Verbalized acceptance and understanding.  Flu Vaccine status: Due, Education has been provided regarding the importance of this vaccine. Advised may receive this vaccine at local pharmacy or Health Dept. Aware to provide a copy of the vaccination record if obtained from local pharmacy or Health Dept. Verbalized acceptance and understanding.  Pneumococcal vaccine status: Up to date  Covid-19 vaccine status: Information provided on how to obtain vaccines.   Qualifies for Shingles Vaccine? No   Zostavax completed No   Shingrix Completed?: Yes  Screening Tests Health Maintenance  Topic Date Due   COVID-19 Vaccine (7 - 2023-24 season) 07/10/2023 (Originally 05/31/2023)   INFLUENZA VACCINE  12/28/2023 (Originally 04/30/2023)   MAMMOGRAM  02/10/2024 (Originally 10/24/2022)   Lung Cancer Screening  06/23/2024 (Originally 10/23/2004)   Medicare Annual Wellness (AWV)  06/23/2024   Pneumonia Vaccine 31+ Years old  Completed   DEXA SCAN  Completed   Hepatitis C Screening  Completed   Zoster Vaccines- Shingrix  Completed   HPV VACCINES  Aged Out   DTaP/Tdap/Td  Discontinued   Colonoscopy  Discontinued    Health Maintenance  There are no preventive care reminders to display for this patient.   Colorectal cancer screening: No longer required.   Mammogram status: Completed  . Repeat every year  Bone Density declined   completed  2016  Lung Cancer Screening: (Low Dose CT Chest recommended if Age 25-80 years, 20 pack-year currently smoking OR have quit w/in 15years.) does not qualify.   Lung Cancer Screening Referral:    Additional Screening:  Hepatitis C Screening: does not qualify; Completed 2024  Vision Screening: Recommended annual ophthalmology exams for early detection of glaucoma and other disorders of the eye. Is the patient up to date with  their annual eye exam?  No  Who is the provider or what is the name of the office in which the patient attends annual eye exams? Has had implants   If pt is not established with a provider, would they like to be referred to a provider to establish care? No .   Dental Screening: Recommended annual dental exams for proper oral hygiene    Community Resource Referral / Chronic Care Management: CRR required this visit?  No   CCM required this visit?  No     Plan:     I have personally reviewed and noted the following in the patient's chart:   Medical and social history Use of alcohol, tobacco or illicit drugs  Current medications and supplements including opioid prescriptions. Patient is not currently taking opioid prescriptions. Functional  ability and status Nutritional status Physical activity Advanced directives List of other physicians Hospitalizations, surgeries, and ER visits in previous 12 months Vitals Screenings to include cognitive, depression, and falls Referrals and appointments  In addition, I have reviewed and discussed with patient certain preventive protocols, quality metrics, and best practice recommendations. A written personalized care plan for preventive services as well as general preventive health recommendations were provided to patient.     Remi Haggard, LPN   1/61/0960   After Visit Summary: (MyChart) Due to this being a telephonic visit, the after visit summary with patients personalized plan was offered to patient via MyChart   Nurse Notes:

## 2023-06-28 ENCOUNTER — Encounter: Payer: Self-pay | Admitting: Internal Medicine

## 2023-06-28 NOTE — Progress Notes (Deleted)
Subjective:    Patient ID: [name], female    DOB: 1946/06/07, 77 y.o.   MRN: [id number]     HPI Cadee is here for follow up of her chronic medical problems.  Was here 9/16 for fatigue and decreased appetite.  Was only drinking ensure 2-3 / day.  Started end of July after aneurysm repair.  Labs unremarkable.  Started remeron to see if that would help chronic sleep issues, appetite.     Medications and allergies reviewed with patient and updated if appropriate.  Current Outpatient Medications on File Prior to Visit  Medication Sig Dispense Refill   amLODipine (NORVASC) 5 MG tablet Take 1 tablet (5 mg total) by mouth daily. 90 tablet 3   aspirin EC 81 MG tablet Take 1 tablet (81 mg total) by mouth daily at 6 (six) AM. Swallow whole.     atorvastatin (LIPITOR) 20 MG tablet Take 1 tablet (20 mg total) by mouth daily. 90 tablet 3   Cobalamin Combinations (NEURIVA PLUS PO) Take 1 capsule by mouth daily.     Cobalamin Combinations (NEURIVA PLUS) CHEW Chew by mouth.     cyclobenzaprine (FLEXERIL) 5 MG tablet Take 1 tablet (5 mg total) by mouth 3 (three) times daily as needed for muscle spasms. 180 tablet 3   diazepam (VALIUM) 5 MG tablet TAKE ONE TABLET DAILY AS NEEDED FOR ANXIETY 30 tablet 1   HYDROcodone-acetaminophen (NORCO) 10-325 MG tablet Take 1 tablet by mouth 3 (three) times daily as needed for moderate pain.     levocetirizine (XYZAL) 5 MG tablet Take 5 mg by mouth daily as needed for allergies.     mirtazapine (REMERON) 7.5 MG tablet Take 1 tablet (7.5 mg total) by mouth at bedtime. 30 tablet 0   montelukast (SINGULAIR) 10 MG tablet TAKE ONE TABLET AT BEDTIME 90 tablet 1   Multiple Vitamin (MULTIVITAMIN) tablet Take 1 tablet by mouth daily.     Multiple Vitamins-Minerals (HAIR SKIN & NAILS) TABS Take 1 tablet by mouth daily.     vitamin B-12 (CYANOCOBALAMIN) 1000 MCG tablet Take 1 tablet (1,000 mcg total) by mouth daily. 100 tablet 2   No current  facility-administered medications on file prior to visit.     Review of Systems     Objective:  There were no vitals filed for this visit. BP Readings from Last 3 Encounters:  06/15/23 116/80  05/26/23 132/85  04/21/23 137/83   Wt Readings from Last 3 Encounters:  06/15/23 113 lb 6.4 oz (51.4 kg)  05/26/23 112 lb 11.2 oz (51.1 kg)  04/20/23 108 lb (49 kg)   There is no height or weight on file to calculate BMI.    Physical Exam     Lab Results  Component Value Date   WBC 9.3 06/15/2023   HGB 14.2 06/15/2023   HCT 43.2 06/15/2023   PLT 249.0 06/15/2023   GLUCOSE 75 06/15/2023   CHOL 159 03/07/2021   TRIG 109.0 03/07/2021   HDL 63.20 03/07/2021   LDLDIRECT 140.6 01/31/2013   LDLCALC 74 03/07/2021   ALT 13 06/15/2023   AST 25 06/15/2023   NA 136 06/15/2023   K 3.8 06/15/2023   CL 97 06/15/2023   CREATININE 0.80 06/15/2023   BUN 18 06/15/2023   CO2 30 06/15/2023   TSH 0.97 06/15/2023   INR 1.0 04/14/2023     Assessment & Plan:    See Problem List for Assessment and Plan of chronic medical problems.

## 2023-06-29 ENCOUNTER — Ambulatory Visit: Payer: 59 | Admitting: Internal Medicine

## 2023-07-01 ENCOUNTER — Ambulatory Visit: Payer: 59 | Admitting: Internal Medicine

## 2023-07-05 NOTE — Progress Notes (Deleted)
Subjective:    Patient ID: [name], female    DOB: [birth year], 77 y.o.   MRN: [id number]     HPI [name] is here for follow up of her chronic medical problems.  She was here 9/16 with dec appetite, fatigue.  Was drinking ensure but was not able to eat any food.  Symptoms really started after surgery the end of July.  She has chronic sleep issues.  She was not depressed/anxious.  We decided to try remeron at night to see if that helped with sleep, energy and appetite.    Medications and allergies reviewed with patient and updated if appropriate.  Current Outpatient Medications on File Prior to Visit  Medication Sig Dispense Refill   amLODipine (NORVASC) 5 MG tablet Take 1 tablet (5 mg total) by mouth daily. 90 tablet 3   aspirin EC 81 MG tablet Take 1 tablet (81 mg total) by mouth daily at 6 (six) AM. Swallow whole.     atorvastatin (LIPITOR) 20 MG tablet Take 1 tablet (20 mg total) by mouth daily. 90 tablet 3   Cobalamin Combinations (NEURIVA PLUS PO) Take 1 capsule by mouth daily.     Cobalamin Combinations (NEURIVA PLUS) CHEW Chew by mouth.     cyclobenzaprine (FLEXERIL) 5 MG tablet Take 1 tablet (5 mg total) by mouth 3 (three) times daily as needed for muscle spasms. 180 tablet 3   diazepam (VALIUM) 5 MG tablet TAKE ONE TABLET DAILY AS NEEDED FOR ANXIETY 30 tablet 1   HYDROcodone-acetaminophen (NORCO) 10-325 MG tablet Take 1 tablet by mouth 3 (three) times daily as needed for moderate pain.     levocetirizine (XYZAL) 5 MG tablet Take 5 mg by mouth daily as needed for allergies.     mirtazapine (REMERON) 7.5 MG tablet Take 1 tablet (7.5 mg total) by mouth at bedtime. 30 tablet 0   montelukast (SINGULAIR) 10 MG tablet TAKE ONE TABLET AT BEDTIME 90 tablet 1   Multiple Vitamin (MULTIVITAMIN) tablet Take 1 tablet by mouth daily.     Multiple Vitamins-Minerals (HAIR SKIN & NAILS) TABS Take 1 tablet by mouth daily.     vitamin B-12 (CYANOCOBALAMIN) 1000 MCG tablet Take 1  tablet (1,000 mcg total) by mouth daily. 100 tablet 2   No current facility-administered medications on file prior to visit.     Review of Systems     Objective:  There were no vitals filed for this visit. BP Readings from Last 3 Encounters:  06/15/23 116/80  05/26/23 132/85  04/21/23 137/83   Wt Readings from Last 3 Encounters:  06/15/23 113 lb 6.4 oz (51.4 kg)  05/26/23 112 lb 11.2 oz (51.1 kg)  04/20/23 108 lb (49 kg)   There is no height or weight on file to calculate BMI.    Physical Exam     Lab Results  Component Value Date   WBC 9.3 06/15/2023   HGB 14.2 06/15/2023   HCT 43.2 06/15/2023   PLT 249.0 06/15/2023   GLUCOSE 75 06/15/2023   CHOL 159 03/07/2021   TRIG 109.0 03/07/2021   HDL 63.20 03/07/2021   LDLDIRECT 140.6 01/31/2013   LDLCALC 74 03/07/2021   ALT 13 06/15/2023   AST 25 06/15/2023   NA 136 06/15/2023   K 3.8 06/15/2023   CL 97 06/15/2023   CREATININE 0.80 06/15/2023   BUN 18 06/15/2023   CO2 30 06/15/2023   TSH 0.97 06/15/2023   INR 1.0 04/14/2023  Assessment & Plan:    See Problem List for Assessment and Plan of chronic medical problems.

## 2023-07-06 ENCOUNTER — Ambulatory Visit: Payer: 59 | Admitting: Internal Medicine

## 2023-07-06 DIAGNOSIS — R630 Anorexia: Secondary | ICD-10-CM

## 2023-07-06 DIAGNOSIS — R5383 Other fatigue: Secondary | ICD-10-CM

## 2023-07-17 ENCOUNTER — Ambulatory Visit (INDEPENDENT_AMBULATORY_CARE_PROVIDER_SITE_OTHER): Payer: 59 | Admitting: Internal Medicine

## 2023-07-17 ENCOUNTER — Encounter: Payer: Self-pay | Admitting: Internal Medicine

## 2023-07-17 VITALS — BP 136/72 | HR 92 | Temp 98.3°F | Ht 66.0 in | Wt 112.0 lb

## 2023-07-17 DIAGNOSIS — R636 Underweight: Secondary | ICD-10-CM | POA: Insufficient documentation

## 2023-07-17 DIAGNOSIS — K591 Functional diarrhea: Secondary | ICD-10-CM | POA: Diagnosis not present

## 2023-07-17 MED ORDER — DIPHENOXYLATE-ATROPINE 2.5-0.025 MG PO TABS: 1.0000 | ORAL_TABLET | Freq: Two times a day (BID) | ORAL | 0 refills | Status: DC | PRN

## 2023-07-17 MED ORDER — MEGESTROL ACETATE 400 MG/10ML PO SUSP: 400.0000 mg | Freq: Every day | ORAL | 11 refills | Status: DC

## 2023-07-17 NOTE — Patient Instructions (Addendum)
We have sent in megace liquid to take 10 mL daily to help with appetite.

## 2023-07-17 NOTE — Assessment & Plan Note (Signed)
Rx lomotil as this has worsened significantly recently. We talked about food triggers. She has had prior colon cancer screening and aged out further. If no resolution can refer to GI for assessment.

## 2023-07-17 NOTE — Assessment & Plan Note (Signed)
Rx megace for appetite. She is eating but no appetite. Stop remeron as this did not help with appetite.

## 2023-07-17 NOTE — Progress Notes (Signed)
Subjective:   Patient ID: [name], female    DOB: [birth year], 77 y.o.   MRN: [id number]  HPI The patient is a 77 YO female coming in for follow up. No improvement with appetite with the remeron. Having worsening diarrhea to point of not being able to leave house.   Review of Systems  Constitutional:  Positive for appetite change.  HENT: Negative.    Eyes: Negative.   Respiratory:  Negative for cough, chest tightness and shortness of breath.   Cardiovascular:  Negative for chest pain, palpitations and leg swelling.  Gastrointestinal:  Positive for diarrhea. Negative for abdominal distention, abdominal pain, constipation, nausea and vomiting.  Musculoskeletal: Negative.   Skin: Negative.   Neurological: Negative.   Psychiatric/Behavioral: Negative.      Objective:  Physical Exam Constitutional:      Appearance: She is well-developed.  HENT:     Head: Normocephalic and atraumatic.  Cardiovascular:     Rate and Rhythm: Normal rate and regular rhythm.  Pulmonary:     Effort: Pulmonary effort is normal. No respiratory distress.     Breath sounds: Normal breath sounds. No wheezing or rales.  Abdominal:     General: Bowel sounds are normal. There is no distension.     Palpations: Abdomen is soft.     Tenderness: There is no abdominal tenderness. There is no rebound.  Musculoskeletal:     Cervical back: Normal range of motion.  Skin:    General: Skin is warm and dry.  Neurological:     Mental Status: She is alert and oriented to person, place, and time.     Coordination: Coordination normal.     Vitals:   07/17/23 1350 07/17/23 1355 07/17/23 1415  BP: (!) 140/82 (!) 140/82 136/72  Pulse: 92    Temp: 98.3 F (36.8 C)    TempSrc: Oral    SpO2: 96%    Weight: 112 lb (50.8 kg)    Height: 5\' 6"  (1.676 m)      Assessment & Plan:

## 2023-08-05 DIAGNOSIS — R059 Cough, unspecified: Secondary | ICD-10-CM | POA: Diagnosis not present

## 2023-08-05 DIAGNOSIS — R051 Acute cough: Secondary | ICD-10-CM | POA: Diagnosis not present

## 2023-08-05 DIAGNOSIS — J209 Acute bronchitis, unspecified: Secondary | ICD-10-CM | POA: Diagnosis not present

## 2023-08-10 DIAGNOSIS — M47817 Spondylosis without myelopathy or radiculopathy, lumbosacral region: Secondary | ICD-10-CM | POA: Diagnosis not present

## 2023-08-19 ENCOUNTER — Telehealth: Payer: Self-pay

## 2023-08-19 NOTE — Telephone Encounter (Signed)
Caller: Patient  Concern: Pt has no appetite, no energy, lost weight and is now 108 lbs, gets short-winded easily. She says "I just don't feel right".  Pt denies any stomach pain, N/V, bloating, hx of IBS so constipation is no different, hx of chronic back pain but no different.  PCP has checked to r/o other possible problems, labs normal  Description:  began in the middle of August  Treatments:  drinking Ensure. Instructed her to increase her protein and caloric intake with any food she's able to eat.  Procedure:  EVAR on 04/20/23  Consulted: Susy Frizzle, PA  Resolution: Appointment scheduled for non-urgent triage appt at next available on Dr. Chestine Spore office day  Next Appt: Appointment scheduled for 09/08/23 at 1315, pt placed on wait list

## 2023-09-07 DIAGNOSIS — M47817 Spondylosis without myelopathy or radiculopathy, lumbosacral region: Secondary | ICD-10-CM | POA: Diagnosis not present

## 2023-09-08 ENCOUNTER — Ambulatory Visit: Payer: 59 | Admitting: Nurse Practitioner

## 2023-09-08 NOTE — Progress Notes (Unsigned)
New Patient Office Visit  Subjective    Patient ID: [name], female    DOB: May 31, 1946  Age: 77 y.o. MRN: [id number]  CC: No chief complaint on file.   HPI [name] presents to establish care ***  Outpatient Encounter Medications as of 09/08/2023  Medication Sig   amLODipine (NORVASC) 5 MG tablet Take 1 tablet (5 mg total) by mouth daily.   aspirin EC 81 MG tablet Take 1 tablet (81 mg total) by mouth daily at 6 (six) AM. Swallow whole.   atorvastatin (LIPITOR) 20 MG tablet Take 1 tablet (20 mg total) by mouth daily.   Cobalamin Combinations (NEURIVA PLUS PO) Take 1 capsule by mouth daily.   Cobalamin Combinations (NEURIVA PLUS) CHEW Chew by mouth.   cyclobenzaprine (FLEXERIL) 5 MG tablet Take 1 tablet (5 mg total) by mouth 3 (three) times daily as needed for muscle spasms.   diazepam (VALIUM) 5 MG tablet TAKE ONE TABLET DAILY AS NEEDED FOR ANXIETY   diphenoxylate-atropine (LOMOTIL) 2.5-0.025 MG tablet Take 1 tablet by mouth 2 (two) times daily as needed for diarrhea or loose stools.   HYDROcodone-acetaminophen (NORCO) 10-325 MG tablet Take 1 tablet by mouth 3 (three) times daily as needed for moderate pain.   levocetirizine (XYZAL) 5 MG tablet Take 5 mg by mouth daily as needed for allergies.   megestrol (MEGACE) 400 MG/10ML suspension Take 10 mLs (400 mg total) by mouth daily.   montelukast (SINGULAIR) 10 MG tablet TAKE ONE TABLET AT BEDTIME   Multiple Vitamin (MULTIVITAMIN) tablet Take 1 tablet by mouth daily.   Multiple Vitamins-Minerals (HAIR SKIN & NAILS) TABS Take 1 tablet by mouth daily.   vitamin B-12 (CYANOCOBALAMIN) 1000 MCG tablet Take 1 tablet (1,000 mcg total) by mouth daily.   No facility-administered encounter medications on file as of 09/08/2023.    Past Medical History:  Diagnosis Date   AAA (abdominal aortic aneurysm) (HCC)    saw Dr Hart [place] 11/2013.  2.7 cm   Arthritis    Chicken pox    COPD (chronic obstructive pulmonary disease) (HCC)     smokes 1/2ppd   DDD (degenerative disc disease), lumbar    GERD (gastroesophageal reflux disease)    diet controlled   Headache    Hx of migraines   History of blood transfusion    History of lumbar fusion 08/09/2014   Hyperlipidemia    IBS (irritable bowel syndrome)    diet contolled   Mild mitral regurgitation    Peripheral vascular disease (HCC)    AAA   RBBB    S/P arthroscopy of right shoulder 12/15/2017   Seasonal allergies    Varicose veins     Past Surgical History:  Procedure Laterality Date   ABDOMINAL AORTIC ENDOVASCULAR STENT GRAFT Bilateral 04/20/2023   Procedure: ABDOMINAL AORTIC ENDOVASCULAR STENT GRAFT;  Surgeon: Cephus Shelling, MD;  Location: [facility] OR;  Service: Vascular;  Laterality: Bilateral;   APPENDECTOMY     BACK SURGERY N/A [phone]   5 back surgeries   BREAST BIOPSY  1989,05   lt br bx   BREAST EXCISIONAL BIOPSY Bilateral    CATARACT EXTRACTION W/ INTRAOCULAR LENS IMPLANT Bilateral 2018   CHOLECYSTECTOMY     COLONOSCOPY     EXCISION/RELEASE BURSA HIP Right 05/12/2013   Procedure: EXCISION RIGHT TROCHANTERIC BURSA/CALCIFICATION ;  Surgeon: Loreta Ave, MD;  Location: [facility];  Service: Orthopedics;  Laterality: Right;   FASCIOTOMY Right 05/12/2013   Procedure: RIGHT FASCIOTOMY HIP  ANY TYPE;  Surgeon: Loreta Ave, MD;  Location: [facility];  Service: Orthopedics;  Laterality: Right;   LARYNGOSCOPY AND BRONCHOSCOPY N/A 08/15/2019   Procedure: BRONCHOSCOPY/LARYNGOSCOPY;  Surgeon: Newman Pies, MD;  Location: [facility];  Service: ENT;  Laterality: N/A;   NM MYOCAR PERF WALL MOTION  04/13/2003   negative   SHOULDER ARTHROSCOPY WITH OPEN ROTATOR CUFF REPAIR Right 2018   skin cancer removed right leg     TONSILLECTOMY AND ADENOIDECTOMY  1950   TOTAL HIP ARTHROPLASTY Left 2006   lt total hip   TOTAL HIP ARTHROPLASTY Right 2011   TOTAL HIP ARTHROPLASTY Right 2016   second surgery due to recall    ULTRASOUND GUIDANCE FOR VASCULAR ACCESS Bilateral 04/20/2023   Procedure: ULTRASOUND GUIDANCE FOR VASCULAR ACCESS;  Surgeon: Cephus Shelling, MD;  Location: [facility] OR;  Service: Vascular;  Laterality: Bilateral;   US ECHOCARDIOGRAPHY  04/13/2003   EF 65%, mild TR w/suggestion of mild pulmonary hypertension,thickened anterior leaflet,central mild MR.   VAGINAL HYSTERECTOMY  1993    Family History  Problem Relation Age of Onset   Early death Mother        aortic aneurysm   Vasculitis Mother        temporal arteritis   Aortic aneurysm Mother    Heart disease Mother        before age 49   Hyperlipidemia Mother    Hypertension Mother    COPD Sister    Hypertension Sister    Depression Sister    Heart disease Sister        before age 17   Post-traumatic stress disorder Sister    Bipolar disorder Sister    Brain cancer Maternal Aunt 43       brain   Heart disease Maternal Grandmother        aortic aneurysm   Aortic aneurysm Maternal Grandmother    Hypertension Sister    Throat cancer Maternal Grandfather    Breast cancer Neg Hx     Social History   Socioeconomic History   Marital status: Legally Separated    Spouse name: Not on file   Number of children: Not on file   Years of education: Not on file   Highest education level: Not on file  Occupational History   Not on file  Tobacco Use   Smoking status: Every Day    Current packs/day: 0.75    Average packs/day: 0.8 packs/day for 30.0 years (22.5 ttl pk-yrs)    Types: Cigarettes   Smokeless tobacco: Never  Vaping Use   Vaping status: Never Used  Substance and Sexual Activity   Alcohol use: Not Currently    Alcohol/week: 0.0 standard drinks of alcohol    Comment: very occasional, not monthly   Drug use: No   Sexual activity: Not Currently  Other Topics Concern   Not on file  Social History Narrative   Not on file   Social Determinants of Health   Financial Resource Strain: Low Risk  (06/23/2022)   Overall  Financial Resource Strain (CARDIA)    Difficulty of Paying Living Expenses: Not hard at all  Food Insecurity: No Food Insecurity (06/24/2023)   Hunger Vital Sign    Worried About Running Out of Food in the Last Year: Never true    Ran Out of Food in the Last Year: Never true  Transportation Needs: No Transportation Needs (06/24/2023)   PRAPARE - Transportation    Lack of Transportation (  Medical): No    Lack of Transportation (Non-Medical): No  Physical Activity: Inactive (06/24/2023)   Exercise Vital Sign    Days of Exercise per Week: 0 days    Minutes of Exercise per Session: 0 min  Stress: No Stress Concern Present (06/23/2022)   [employer] of Occupational Health - Occupational Stress Questionnaire    Feeling of Stress : Not at all  Social Connections: Socially Isolated (06/24/2023)   Social Connection and Isolation Panel [NHANES]    Frequency of Communication with Friends and Family: Three times a week    Frequency of Social Gatherings with Friends and Family: Once a week    Attends Religious Services: Never    Database administrator or Organizations: No    Attends Banker Meetings: Never    Marital Status: Separated  Intimate Partner Violence: Not At Risk (06/24/2023)   Humiliation, Afraid, Rape, and Kick questionnaire    Fear of Current or Ex-Partner: No    Emotionally Abused: No    Physically Abused: No    Sexually Abused: No    ROS Negative unless indicated in HPI   Objective    There were no vitals taken for this visit.  Physical Exam  Last CBC Lab Results  Component Value Date   WBC 9.3 06/15/2023   HGB 14.2 06/15/2023   HCT 43.2 06/15/2023   MCV 100.0 06/15/2023   MCH 33.3 04/21/2023   RDW 14.3 06/15/2023   PLT 249.0 06/15/2023   Last metabolic panel Lab Results  Component Value Date   GLUCOSE 75 06/15/2023   NA 136 06/15/2023   K 3.8 06/15/2023   CL 97 06/15/2023   CO2 30 06/15/2023   BUN 18 06/15/2023   CREATININE 0.80 06/15/2023    GFR 70.33 06/15/2023   CALCIUM 9.5 06/15/2023   PROT 7.3 06/15/2023   ALBUMIN 4.3 06/15/2023   LABGLOB 2.2 11/08/2020   AGRATIO 2.1 11/08/2020   BILITOT 0.5 06/15/2023   ALKPHOS 85 06/15/2023   AST 25 06/15/2023   ALT 13 06/15/2023   ANIONGAP 11 04/21/2023   Last lipids Lab Results  Component Value Date   CHOL 159 03/07/2021   HDL 63.20 03/07/2021   LDLCALC 74 03/07/2021   LDLDIRECT 140.6 01/31/2013   TRIG 109.0 03/07/2021   CHOLHDL 3 03/07/2021   Last hemoglobin A1c No results found for: "HGBA1C" Last thyroid functions Lab Results  Component Value Date   TSH 0.97 06/15/2023        Assessment & Plan:  Routine medical exam   Continue healthy lifestyle choices, including diet ([place] in fruits, vegetables, and lean proteins, and low in salt and simple carbohydrates) and exercise (at least 30 minutes of moderate physical activity daily).     The above assessment and management plan was discussed with the patient. The patient verbalized understanding of and has agreed to the management plan. Patient is aware to call the clinic if they develop any new symptoms or if symptoms persist or worsen. Patient is aware when to return to the clinic for a follow-up visit. Patient educated on when it is appropriate to go to the emergency department.  No follow-ups on file.   Arrie Aran Santa Lighter, [facility] [address] [place], [place] [address] [phone]  Note: This document was prepared by Reubin Milan voice dictation technology and any errors that results from this process are unintentional.

## 2023-09-15 ENCOUNTER — Encounter: Payer: Self-pay | Admitting: Physician Assistant

## 2023-09-15 ENCOUNTER — Ambulatory Visit (INDEPENDENT_AMBULATORY_CARE_PROVIDER_SITE_OTHER): Payer: 59 | Admitting: Physician Assistant

## 2023-09-15 VITALS — BP 164/76 | HR 82 | Temp 98.3°F | Resp 18 | Ht 66.0 in | Wt 108.4 lb

## 2023-09-15 DIAGNOSIS — Z95828 Presence of other vascular implants and grafts: Secondary | ICD-10-CM | POA: Diagnosis not present

## 2023-09-15 NOTE — Progress Notes (Signed)
HISTORY AND PHYSICAL     CC:  follow up. For EVAR Requesting Provider:  Myrlene Broker, *  HPI: This is a 77 y.o. female who is here today for follow up for AAA and is s/p EVAR on  04/20/2023 for 5.2cm AAA by Dr. Chestine Spore.  Pt was last seen 05/26/2023 and at that time, she was doing well.  She had palpable femoral and pedal pulses.  She had a type II endoleak that would continue with surveillance and she was scheduled for 6 month follow up.  He was monitoring her right EIA dissection and it was not flow limiting.  The pt returns today for follow up studies. She called with c/o decreased appetite and fatigue.   She states that she just doesn't have any interest in food.  She states she was wearing size 2 pants and now is size zero and they are too big.  She denies any fear of eating or pain after eating.  She states that her last colonoscopy was around 4-5 years ago.  She states that she takes care of her sister, who is 10 years younger than her.  She denies being depressed.  She denies any blood in her urine or stool.  She does not have any dark stools.  She denies claudication, rest pain or non healing wounds.  She is smoking about 5-6 cigarettes per day.    The pt is on a statin for cholesterol management.    The pt is on an aspirin.    Other AC:  none The pt is on CCB for hypertension.  The pt is not on medication for diabetes. Tobacco hx:  current  Pt does have family hx of AAA and it affected the women in her family.  She does not have children.   Past Medical History:  Diagnosis Date   AAA (abdominal aortic aneurysm) (HCC)    saw Dr Hart Rochester 11/2013.  2.7 cm   Arthritis    Chicken pox    COPD (chronic obstructive pulmonary disease) (HCC)    smokes 1/2ppd   DDD (degenerative disc disease), lumbar    GERD (gastroesophageal reflux disease)    diet controlled   Headache    Hx of migraines   History of blood transfusion    History of lumbar fusion 08/09/2014   Hyperlipidemia     IBS (irritable bowel syndrome)    diet contolled   Mild mitral regurgitation    Peripheral vascular disease (HCC)    AAA   RBBB    S/P arthroscopy of right shoulder 12/15/2017   Seasonal allergies    Varicose veins     Past Surgical History:  Procedure Laterality Date   ABDOMINAL AORTIC ENDOVASCULAR STENT GRAFT Bilateral 04/20/2023   Procedure: ABDOMINAL AORTIC ENDOVASCULAR STENT GRAFT;  Surgeon: Cephus Shelling, MD;  Location: [facility] OR;  Service: Vascular;  Laterality: Bilateral;   APPENDECTOMY     BACK SURGERY N/A [phone]   5 back surgeries   BREAST BIOPSY  1989,05   lt br bx   BREAST EXCISIONAL BIOPSY Bilateral    CATARACT EXTRACTION W/ INTRAOCULAR LENS IMPLANT Bilateral 2018   CHOLECYSTECTOMY     COLONOSCOPY     EXCISION/RELEASE BURSA HIP Right 05/12/2013   Procedure: EXCISION RIGHT TROCHANTERIC BURSA/CALCIFICATION ;  Surgeon: Loreta Ave, MD;  Location: [facility];  Service: Orthopedics;  Laterality: Right;   FASCIOTOMY Right 05/12/2013   Procedure: RIGHT FASCIOTOMY HIP ANY TYPE;  Surgeon: Loreta Ave, MD;  Location: [facility];  Service: Orthopedics;  Laterality: Right;   LARYNGOSCOPY AND BRONCHOSCOPY N/A 08/15/2019   Procedure: BRONCHOSCOPY/LARYNGOSCOPY;  Surgeon: Newman Pies, MD;  Location: [facility];  Service: ENT;  Laterality: N/A;   NM MYOCAR PERF WALL MOTION  04/13/2003   negative   SHOULDER ARTHROSCOPY WITH OPEN ROTATOR CUFF REPAIR Right 2018   skin cancer removed right leg     TONSILLECTOMY AND ADENOIDECTOMY  1950   TOTAL HIP ARTHROPLASTY Left 2006   lt total hip   TOTAL HIP ARTHROPLASTY Right 2011   TOTAL HIP ARTHROPLASTY Right 2016   second surgery due to recall   ULTRASOUND GUIDANCE FOR VASCULAR ACCESS Bilateral 04/20/2023   Procedure: ULTRASOUND GUIDANCE FOR VASCULAR ACCESS;  Surgeon: Cephus Shelling, MD;  Location: [facility] OR;  Service: Vascular;  Laterality: Bilateral;   US ECHOCARDIOGRAPHY   04/13/2003   EF 65%, mild TR w/suggestion of mild pulmonary hypertension,thickened anterior leaflet,central mild MR.   VAGINAL HYSTERECTOMY  1993    Allergies  Allergen Reactions   Bee Venom     Other reaction(s): Redness   Cephalexin Other (See Comments)    Headaches   Gabapentin Diarrhea    Current Outpatient Medications  Medication Sig Dispense Refill   amLODipine (NORVASC) 5 MG tablet Take 1 tablet (5 mg total) by mouth daily. 90 tablet 3   aspirin EC 81 MG tablet Take 1 tablet (81 mg total) by mouth daily at 6 (six) AM. Swallow whole.     atorvastatin (LIPITOR) 20 MG tablet Take 1 tablet (20 mg total) by mouth daily. 90 tablet 3   Cobalamin Combinations (NEURIVA PLUS PO) Take 1 capsule by mouth daily.     Cobalamin Combinations (NEURIVA PLUS) CHEW Chew by mouth.     cyclobenzaprine (FLEXERIL) 5 MG tablet Take 1 tablet (5 mg total) by mouth 3 (three) times daily as needed for muscle spasms. 180 tablet 3   diazepam (VALIUM) 5 MG tablet TAKE ONE TABLET DAILY AS NEEDED FOR ANXIETY 30 tablet 1   diphenoxylate-atropine (LOMOTIL) 2.5-0.025 MG tablet Take 1 tablet by mouth 2 (two) times daily as needed for diarrhea or loose stools. 45 tablet 0   HYDROcodone-acetaminophen (NORCO) 10-325 MG tablet Take 1 tablet by mouth 3 (three) times daily as needed for moderate pain.     levocetirizine (XYZAL) 5 MG tablet Take 5 mg by mouth daily as needed for allergies.     megestrol (MEGACE) 400 MG/10ML suspension Take 10 mLs (400 mg total) by mouth daily. 300 mL 11   montelukast (SINGULAIR) 10 MG tablet TAKE ONE TABLET AT BEDTIME 90 tablet 1   Multiple Vitamin (MULTIVITAMIN) tablet Take 1 tablet by mouth daily.     Multiple Vitamins-Minerals (HAIR SKIN & NAILS) TABS Take 1 tablet by mouth daily.     vitamin B-12 (CYANOCOBALAMIN) 1000 MCG tablet Take 1 tablet (1,000 mcg total) by mouth daily. 100 tablet 2   No current facility-administered medications for this visit.    Family History  Problem  Relation Age of Onset   Early death Mother        aortic aneurysm   Vasculitis Mother        temporal arteritis   Aortic aneurysm Mother    Heart disease Mother        before age 63   Hyperlipidemia Mother    Hypertension Mother    COPD Sister    Hypertension Sister    Depression Sister    Heart disease  Sister        before age 59   Post-traumatic stress disorder Sister    Bipolar disorder Sister    Brain cancer Maternal Aunt 46       brain   Heart disease Maternal Grandmother        aortic aneurysm   Aortic aneurysm Maternal Grandmother    Hypertension Sister    Throat cancer Maternal Grandfather    Breast cancer Neg Hx     Social History   Socioeconomic History   Marital status: Legally Separated    Spouse name: Not on file   Number of children: Not on file   Years of education: Not on file   Highest education level: Not on file  Occupational History   Not on file  Tobacco Use   Smoking status: Every Day    Current packs/day: 0.75    Average packs/day: 0.8 packs/day for 30.0 years (22.5 ttl pk-yrs)    Types: Cigarettes   Smokeless tobacco: Never   Tobacco comments:    "Cut way back" about 4-5 cigs/day  Vaping Use   Vaping status: Never Used  Substance and Sexual Activity   Alcohol use: Not Currently    Alcohol/week: 0.0 standard drinks of alcohol    Comment: very occasional, not monthly   Drug use: No   Sexual activity: Not Currently  Other Topics Concern   Not on file  Social History Narrative   Not on file   Social Drivers of Health   Financial Resource Strain: Low Risk  (06/23/2022)   Overall Financial Resource Strain (CARDIA)    Difficulty of Paying Living Expenses: Not hard at all  Food Insecurity: No Food Insecurity (06/24/2023)   Hunger Vital Sign    Worried About Running Out of Food in the Last Year: Never true    Ran Out of Food in the Last Year: Never true  Transportation Needs: No Transportation Needs (06/24/2023)   PRAPARE -  Administrator, Civil Service (Medical): No    Lack of Transportation (Non-Medical): No  Physical Activity: Inactive (06/24/2023)   Exercise Vital Sign    Days of Exercise per Week: 0 days    Minutes of Exercise per Session: 0 min  Stress: No Stress Concern Present (06/23/2022)   [employer] of Occupational Health - Occupational Stress Questionnaire    Feeling of Stress : Not at all  Social Connections: Socially Isolated (06/24/2023)   Social Connection and Isolation Panel [NHANES]    Frequency of Communication with Friends and Family: Three times a week    Frequency of Social Gatherings with Friends and Family: Once a week    Attends Religious Services: Never    Database administrator or Organizations: No    Attends Banker Meetings: Never    Marital Status: Separated  Intimate Partner Violence: Not At Risk (06/24/2023)   Humiliation, Afraid, Rape, and Kick questionnaire    Fear of Current or Ex-Partner: No    Emotionally Abused: No    Physically Abused: No    Sexually Abused: No     REVIEW OF SYSTEMS:   [X]  denotes positive finding, [ ]  denotes negative finding Cardiac  Comments:  Chest pain or chest pressure:    Shortness of breath upon exertion:    Short of breath when lying flat:    Irregular heart rhythm:        Vascular    Pain in calf, thigh, or hip brought on by ambulation:  Pain in feet at night that wakes you up from your sleep:     Blood clot in your veins:    Leg swelling:         Pulmonary    Oxygen at home:    Productive cough:     Wheezing:         Neurologic    Sudden weakness in arms or legs:     Sudden numbness in arms or legs:     Sudden onset of difficulty speaking or slurred speech:    Temporary loss of vision in one eye:     Problems with dizziness:         Gastrointestinal    Blood in stool:     Vomited blood:         Genitourinary    Burning when urinating:     Blood in urine:        Psychiatric     Major depression:         Hematologic    Bleeding problems:    Problems with blood clotting too easily:        Skin    Rashes or ulcers:        Constitutional    Fever or chills:      PHYSICAL EXAMINATION:  Today's Vitals   09/15/23 1259 09/15/23 1305  BP: (!) 164/76   Pulse: 82   Resp: 18   Temp: 98.3 F (36.8 C)   TempSrc: Temporal   SpO2: 94%   Weight: 108 lb 6.4 oz (49.2 kg)   Height: 5\' 6"  (1.676 m)   PainSc: 6  6   PainLoc: Back    Body mass index is 17.5 kg/m.   General:  WDWN in NAD; vital signs documented above Gait: Not observed HENT: WNL, normocephalic Pulmonary: normal non-labored breathing  Cardiac: regular HR;  without carotid bruits Abdomen: soft, NT; aortic pulse is not palpable Skin: without rashes Vascular Exam/Pulses:  Right Left  Radial 2+ (normal) 2+ (normal)  Femoral 2+ (normal) 2+ (normal)  DP 2+ (normal) 2+ (normal)   Extremities: without open wounds Musculoskeletal: no muscle wasting or atrophy  Neurologic: A&O X 3 Psychiatric:  The pt has Normal affect.   Non-Invasive Vascular Imaging:   CTA 05/20/2023: 1. Interval surgical changes of endovascular aortic repair of the infrarenal abdominal aortic aneurysm. Arterial phase contrast is present within the excluded aneurysm sac posteriorly just above the  graft flow divider. The contrast material appears continuous with the graft lumen. This raises the possibility of an underlying type 3 endoleak. Alternately, a type 2 endoleak arising from the lumbar arteries is a consideration. The excluded aneurysm sac remains stable in size at a maximum of 5.2 cm. 2. Non flow limiting dissection in the right external iliac artery is new compared to recent prior imaging. 3. 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 4. 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   ASSESSMENT/PLAN:: 77 y.o. female here with hx of EVAR on  04/20/2023 for 5.2cm AAA by Dr. Chestine Spore with  unintentional weight loss and fatigue  -pt with palpable femoral and DP pulses bilaterally -she has lost weight and has dropped a pant size and it is now too big.  She does not have fear of food or N/V or abdominal pain but given her hx of EVAR and hx of celiac artery aneurysm, will have her return with mesenteric duplex and see Dr. Chestine Spore in the near future.  -  her last colonoscopy was 4-5 years ago.  She denies any hematuria or hematochezia or melena. CXR in August unremarkable.  -continue asa/statin    Doreatha Massed, [facility] [phone]  Clinic MD:   Chestine Spore

## 2023-09-21 ENCOUNTER — Ambulatory Visit: Payer: 59 | Admitting: Internal Medicine

## 2023-09-22 ENCOUNTER — Ambulatory Visit: Payer: 59 | Admitting: Internal Medicine

## 2023-09-24 ENCOUNTER — Other Ambulatory Visit: Payer: Self-pay

## 2023-09-24 DIAGNOSIS — I728 Aneurysm of other specified arteries: Secondary | ICD-10-CM

## 2023-09-24 DIAGNOSIS — Z95828 Presence of other vascular implants and grafts: Secondary | ICD-10-CM

## 2023-09-28 ENCOUNTER — Ambulatory Visit: Payer: 59 | Admitting: Internal Medicine

## 2023-10-21 NOTE — Progress Notes (Signed)
Established Patient Office Visit  Subjective  Patient ID: [name], female    DOB: 1945/10/28  Age: 78 y.o. MRN: [id number]  Chief Complaint  Patient presents with   Establish Care    HPI [name] is 78 yrs old female presents 10/27/2023 to establish care and needs medication refilled. Past medical history AAA, HTN, Hyperlipidemia, HA, opioid dependency, varicose veins, Chronic pain syndrome.  Underweight: 78 year old female presents with a history of being underweight, with a BMI of 17.53. She states, "I've been like that for years." She is seeking potential treatment options to help boost her appetite. The patient declines the use of SSRIs (selective serotonin reuptake inhibitors), likely due to concerns or past experience with these medications. Current BMI: 17.53 (indicative of being underweight). No other specific complaints mentioned at this time.  Valium Refill She denies previous dx of anxiety and depression, and reports that she has been taking valium for over 80yr for anxiety. She is requesting to have valium refill. " That the main reason I made the appointment today" Explains to client that the writer will be continuing valium, as such she can ask pain management or get a referral to psychiatry which she declines " pain management will not refill valium; my older sister leaves with me, she is bipolar. I need something to clam me down so I keep my temper under control". She is still refusing referral to psych. She is aware that this writer will not refilled Valium.      10/28/2023   11:22 AM 06/24/2023    3:52 PM 03/23/2023    1:05 PM  PHQ9 SCORE ONLY  PHQ-9 Total Score 0 5 0       10/28/2023   11:22 AM 06/15/2019   10:25 AM  GAD 7 : Generalized Anxiety Score  Nervous, Anxious, on Edge 0 0  Control/stop worrying 0 3  Worry too much - different things 0 0  Trouble relaxing 0 0  Restless 0 0  Easily annoyed or irritable 0 0  Afraid - awful might happen 0 0   Total GAD 7 Score 0 3  Anxiety Difficulty Not difficult at all    Lipid/Cholesterol She has  a dx of hyperlipidemia and currently on Lipitor 20 mg daily which she deneis any side effect from it. No refill needed today, will recheck lipid   Last lipid panel Other pertinent labs  Lab Results  Component Value Date   CHOL 159 03/07/2021   HDL 63.20 03/07/2021   LDLCALC 74 03/07/2021   LDLDIRECT 140.6 01/31/2013   TRIG 109.0 03/07/2021   CHOLHDL 3 03/07/2021   Lab Results  Component Value Date   ALT 13 06/15/2023   AST 25 06/15/2023   PLT 249.0 06/15/2023   TSH 0.97 06/15/2023     She reports excellent compliance with treatment. She is not having side effects.   Symptoms: No chest pain No chest pressure/discomfort  No dyspnea No lower extremity edema  No numbness or tingling of extremity No orthopnea  No palpitations No paroxysmal nocturnal dyspnea  No speech difficulty No syncope  Current diet: not asked Current exercise: none The 10-year ASCVD risk score (Arnett DK, et al., 2019) is: 37.3%  Migraine: History of migraines with aura, occurring approximately monthly over the past 12 yrs. The migraines are typically associated with visual disturbances, such as flashing lights or blind spots, followed by a severe, unilateral headache. The headache is described as throbbing and is often accompanied by nausea,  photophobia, and phonophobia. The patient reports that the migraines usually last for 20-30 mins and are relieved by rest and medications. She has been seen by neurology and has an upcoming appointment.  Hypertension, follow-up  BP Readings from Last 3 Encounters:  10/28/23 121/80  09/15/23 (!) 164/76  07/17/23 136/72   Wt Readings from Last 3 Encounters:  10/28/23 108 lb 9.6 oz (49.3 kg)  09/15/23 108 lb 6.4 oz (49.2 kg)  07/17/23 112 lb (50.8 kg)     She has a dx of  hypertension  BP at that visit was 164/76. Management since that visit includes amlodipine 5 mg  dialy  She reports excellent compliance with treatment. She is not having side effects.  She is following a Regular diet. She is not exercising. She does smoke. Use of agents associated with hypertension: none.  Outside blood pressures are not being monitored symptoms: No chest pain No chest pressure  No palpitations No syncope  No dyspnea No orthopnea  No paroxysmal nocturnal dyspnea No lower extremity edema   Pertinent labs Lab Results  Component Value Date   CHOL 159 03/07/2021   HDL 63.20 03/07/2021   LDLCALC 74 03/07/2021   LDLDIRECT 140.6 01/31/2013   TRIG 109.0 03/07/2021   CHOLHDL 3 03/07/2021   Lab Results  Component Value Date   NA 136 06/15/2023   K 3.8 06/15/2023   CREATININE 0.80 06/15/2023   GFRNONAA >60 04/21/2023   GLUCOSE 75 06/15/2023   TSH 0.97 06/15/2023     The 10-year ASCVD risk score (Arnett DK, et al., 2019) is: 37.3%  Allergic rhinitis: She has a diagnosis of allergic rhinitis and reports that it has been managed with Singulair 10 mg daily and Xyzal 5 mg as needed.  Denies any side effect of the medication is requesting to  Singulair refill Patient Active Problem List   Diagnosis Date Noted   Encounter for smoking cessation counseling 10/28/2023   Encounter for general adult medical examination with abnormal findings 10/28/2023   Low weight 07/17/2023   S/P abdominal aortic aneurysm repair 04/20/2023   AAA (abdominal aortic aneurysm) (HCC) 04/20/2023   Acute low back pain with radicular symptoms, duration less than 6 weeks 02/13/2023   Diarrhea 09/16/2022   QT prolongation 05/23/2022   Mitral regurgitation, mild- moderate 02/28/2022   B12 deficiency 01/17/2022   Abnormal finding on MRI of brain 01/17/2022   Caregiver stress 09/06/2021   Aortic atherosclerosis (HCC) 03/07/2021   Opioid dependence (HCC) 03/07/2021   Migraine without aura and without status migrainosus, not intractable 04/10/2020   Hyperlipidemia    Headache 07/05/2019    AAA (abdominal aortic aneurysm) without rupture (HCC) 08/25/2016   Varicose veins of bilateral lower extremities with other complications 04/08/2016   Bony pelvic pain 02/04/2016   Chronic pain syndrome 08/09/2014   Vitamin D deficiency 05/10/2014   Routine general medical examination at a health care facility 05/10/2014   Lumbar radiculopathy 11/18/2013   Arthralgia 02/07/2013   Tobacco abuse 01/31/2013   Past Medical History:  Diagnosis Date   AAA (abdominal aortic aneurysm) (HCC)    saw Dr Hart [place] 11/2013.  2.7 cm   Arthritis    Chicken pox    COPD (chronic obstructive pulmonary disease) (HCC)    smokes 1/2ppd   DDD (degenerative disc disease), lumbar    GERD (gastroesophageal reflux disease)    diet controlled   Headache    Hx of migraines   History of blood transfusion    History of lumbar  fusion 08/09/2014   Hyperlipidemia    IBS (irritable bowel syndrome)    diet contolled   Mild mitral regurgitation    Peripheral vascular disease (HCC)    AAA   RBBB    S/P arthroscopy of right shoulder 12/15/2017   Seasonal allergies    Varicose veins    Past Surgical History:  Procedure Laterality Date   ABDOMINAL AORTIC ENDOVASCULAR STENT GRAFT Bilateral 04/20/2023   Procedure: ABDOMINAL AORTIC ENDOVASCULAR STENT GRAFT;  Surgeon: Cephus Shelling, MD;  Location: [facility] OR;  Service: Vascular;  Laterality: Bilateral;   APPENDECTOMY     BACK SURGERY N/A [id number]   5 back surgeries   BREAST BIOPSY  1989,05   lt br bx   BREAST EXCISIONAL BIOPSY Bilateral    CATARACT EXTRACTION W/ INTRAOCULAR LENS IMPLANT Bilateral 2018   CHOLECYSTECTOMY     COLONOSCOPY     EXCISION/RELEASE BURSA HIP Right 05/12/2013   Procedure: EXCISION RIGHT TROCHANTERIC BURSA/CALCIFICATION ;  Surgeon: Loreta Ave, MD;  Location: [facility];  Service: Orthopedics;  Laterality: Right;   FASCIOTOMY Right 05/12/2013   Procedure: RIGHT FASCIOTOMY HIP ANY TYPE;  Surgeon: Loreta Ave, MD;   Location: [facility];  Service: Orthopedics;  Laterality: Right;   LARYNGOSCOPY AND BRONCHOSCOPY N/A 08/15/2019   Procedure: BRONCHOSCOPY/LARYNGOSCOPY;  Surgeon: Newman Pies, MD;  Location: [facility];  Service: ENT;  Laterality: N/A;   NM MYOCAR PERF WALL MOTION  04/13/2003   negative   SHOULDER ARTHROSCOPY WITH OPEN ROTATOR CUFF REPAIR Right 2018   skin cancer removed right leg     TONSILLECTOMY AND ADENOIDECTOMY  1950   TOTAL HIP ARTHROPLASTY Left 2006   lt total hip   TOTAL HIP ARTHROPLASTY Right 2011   TOTAL HIP ARTHROPLASTY Right 2016   second surgery due to recall   ULTRASOUND GUIDANCE FOR VASCULAR ACCESS Bilateral 04/20/2023   Procedure: ULTRASOUND GUIDANCE FOR VASCULAR ACCESS;  Surgeon: Cephus Shelling, MD;  Location: [facility] OR;  Service: Vascular;  Laterality: Bilateral;   US ECHOCARDIOGRAPHY  04/13/2003   EF 65%, mild TR w/suggestion of mild pulmonary hypertension,thickened anterior leaflet,central mild MR.   VAGINAL HYSTERECTOMY  1993   Social History   Tobacco Use   Smoking status: Every Day    Current packs/day: 0.75    Average packs/day: 0.8 packs/day for 30.0 years (22.5 ttl pk-yrs)    Types: Cigarettes   Smokeless tobacco: Never   Tobacco comments:    "Cut way back" about 4-5 cigs/day  Vaping Use   Vaping status: Never Used  Substance Use Topics   Alcohol use: Not Currently    Alcohol/week: 0.0 standard drinks of alcohol    Comment: very occasional, not monthly   Drug use: No   Social History   Socioeconomic History   Marital status: Legally Separated    Spouse name: Not on file   Number of children: Not on file   Years of education: Not on file   Highest education level: Not on file  Occupational History   Not on file  Tobacco Use   Smoking status: Every Day    Current packs/day: 0.75    Average packs/day: 0.8 packs/day for 30.0 years (22.5 ttl pk-yrs)    Types: Cigarettes   Smokeless tobacco: Never   Tobacco comments:     "Cut way back" about 4-5 cigs/day  Vaping Use   Vaping status: Never Used  Substance and Sexual Activity   Alcohol use: Not Currently  Alcohol/week: 0.0 standard drinks of alcohol    Comment: very occasional, not monthly   Drug use: No   Sexual activity: Not Currently  Other Topics Concern   Not on file  Social History Narrative   Not on file   Social Drivers of Health   Financial Resource Strain: Low Risk  (10/28/2023)   Overall Financial Resource Strain (CARDIA)    Difficulty of Paying Living Expenses: Not hard at all  Food Insecurity: No Food Insecurity (10/28/2023)   Hunger Vital Sign    Worried About Running Out of Food in the Last Year: Never true    Ran Out of Food in the Last Year: Never true  Transportation Needs: No Transportation Needs (10/28/2023)   PRAPARE - Administrator, Civil Service (Medical): No    Lack of Transportation (Non-Medical): No  Physical Activity: Inactive (10/28/2023)   Exercise Vital Sign    Days of Exercise per Week: 0 days    Minutes of Exercise per Session: 0 min  Stress: No Stress Concern Present (06/23/2022)   [employer] of Occupational Health - Occupational Stress Questionnaire    Feeling of Stress : Not at all  Social Connections: Socially Isolated (06/24/2023)   Social Connection and Isolation Panel [NHANES]    Frequency of Communication with Friends and Family: Three times a week    Frequency of Social Gatherings with Friends and Family: Once a week    Attends Religious Services: Never    Database administrator or Organizations: No    Attends Banker Meetings: Never    Marital Status: Separated  Intimate Partner Violence: Not At Risk (10/28/2023)   Humiliation, Afraid, Rape, and Kick questionnaire    Fear of Current or Ex-Partner: No    Emotionally Abused: No    Physically Abused: No    Sexually Abused: No   Family Status  Relation Name Status   Mother  Deceased at age 90   Sister [name] Alive    Mat Aunt  Deceased   MGM  Deceased at age 35   Sister [name] Alive   Father  Deceased   Brother  Alive   MGF  Deceased at age 14   PGM  Deceased   PGF  Deceased   Neg Hx  (Not Specified)  No partnership data on file   Family History  Problem Relation Age of Onset   Early death Mother        aortic aneurysm   Vasculitis Mother        temporal arteritis   Aortic aneurysm Mother    Heart disease Mother        before age 29   Hyperlipidemia Mother    Hypertension Mother    COPD Sister    Hypertension Sister    Depression Sister    Heart disease Sister        before age 57   Post-traumatic stress disorder Sister    Bipolar disorder Sister    Brain cancer Maternal Aunt 84       brain   Heart disease Maternal Grandmother        aortic aneurysm   Aortic aneurysm Maternal Grandmother    Hypertension Sister    Throat cancer Maternal Grandfather    Breast cancer Neg Hx    Allergies  Allergen Reactions   Bee Venom     Other reaction(s): Redness   Cephalexin Other (See Comments)    Headaches   Gabapentin Diarrhea  Review of Systems  Constitutional:  Negative for chills, fever and weight loss.  HENT:  Negative for congestion and sore throat.   Respiratory:  Negative for cough, shortness of breath and wheezing.   Cardiovascular:  Negative for chest pain and leg swelling.  Gastrointestinal:  Negative for abdominal pain, diarrhea, nausea and vomiting.  Genitourinary:  Negative for frequency and urgency.  Musculoskeletal:  Positive for back pain. Negative for joint pain and myalgias.       Chronic condition  Skin:  Negative for itching and rash.  Neurological:  Negative for dizziness and headaches.  Endo/Heme/Allergies:  Negative for environmental allergies and polydipsia. Does not bruise/bleed easily.  Psychiatric/Behavioral:  Negative for depression, hallucinations and suicidal ideas. The patient does not have insomnia.    Negative unless indicated in HPI    Objective:     BP 121/80   Pulse (!) 119 Comment: pt says she has been rushing around this morning  Temp 97.8 F (36.6 C) (Temporal)   Ht 5\' 6"  (1.676 m)   Wt 108 lb 9.6 oz (49.3 kg)   SpO2 93%   BMI 17.53 kg/m  BP Readings from Last 3 Encounters:  10/28/23 121/80  09/15/23 (!) 164/76  07/17/23 136/72   Wt Readings from Last 3 Encounters:  10/28/23 108 lb 9.6 oz (49.3 kg)  09/15/23 108 lb 6.4 oz (49.2 kg)  07/17/23 112 lb (50.8 kg)      Physical Exam Vitals and nursing note reviewed.  Constitutional:      Appearance: She is underweight.  HENT:     Head: Normocephalic and atraumatic.     Right Ear: Tympanic membrane, ear canal and external ear normal. There is no impacted cerumen.     Left Ear: Tympanic membrane, ear canal and external ear normal. There is no impacted cerumen.     Nose: Nose normal.     Mouth/Throat:     Mouth: Mucous membranes are moist.  Eyes:     General: No scleral icterus.    Extraocular Movements: Extraocular movements intact.     Conjunctiva/sclera: Conjunctivae normal.     Pupils: Pupils are equal, round, and reactive to light.  Neck:     Vascular: No carotid bruit.  Cardiovascular:     Rate and Rhythm: Normal rate and regular rhythm.  Pulmonary:     Effort: Pulmonary effort is normal.     Breath sounds: Normal breath sounds.  Abdominal:     General: Bowel sounds are normal.     Palpations: Abdomen is soft.  Musculoskeletal:        General: Normal range of motion.     Cervical back: Normal range of motion and neck supple. No rigidity or tenderness.     Right lower leg: No edema.     Left lower leg: No edema.  Lymphadenopathy:     Cervical: No cervical adenopathy.  Skin:    General: Skin is warm and dry.     Findings: No rash.  Neurological:     Mental Status: She is alert and oriented to person, place, and time. Mental status is at baseline.  Psychiatric:        Mood and Affect: Mood normal.        Behavior: Behavior normal.         Thought Content: Thought content normal.        Judgment: Judgment normal.     No results found for any visits on 10/28/23.  Last CBC Lab Results  Component Value Date   WBC 9.3 06/15/2023   HGB 14.2 06/15/2023   HCT 43.2 06/15/2023   MCV 100.0 06/15/2023   MCH 33.3 04/21/2023   RDW 14.3 06/15/2023   PLT 249.0 06/15/2023   Last metabolic panel Lab Results  Component Value Date   GLUCOSE 75 06/15/2023   NA 136 06/15/2023   K 3.8 06/15/2023   CL 97 06/15/2023   CO2 30 06/15/2023   BUN 18 06/15/2023   CREATININE 0.80 06/15/2023   GFR 70.33 06/15/2023   CALCIUM 9.5 06/15/2023   PROT 7.3 06/15/2023   ALBUMIN 4.3 06/15/2023   LABGLOB 2.2 11/08/2020   AGRATIO 2.1 11/08/2020   BILITOT 0.5 06/15/2023   ALKPHOS 85 06/15/2023   AST 25 06/15/2023   ALT 13 06/15/2023   ANIONGAP 11 04/21/2023   Last lipids Lab Results  Component Value Date   CHOL 159 03/07/2021   HDL 63.20 03/07/2021   LDLCALC 74 03/07/2021   LDLDIRECT 140.6 01/31/2013   TRIG 109.0 03/07/2021   CHOLHDL 3 03/07/2021   Last hemoglobin A1c No results found for: "HGBA1C" Last thyroid functions Lab Results  Component Value Date   TSH 0.97 06/15/2023        Assessment & Plan:  Mixed hyperlipidemia -     Lipid panel  Migraine without aura and without status migrainosus, not intractable  Seasonal allergies -     Montelukast Sodium; Take 1 tablet (10 mg total) by mouth at bedtime.  Dispense: 90 tablet; Refill: 1  Underweight (BMI < 18.5)  Primary hypertension -     CBC with Differential/Platelet -     CMP14+EGFR  Uncomplicated opioid dependence (HCC)  Tobacco abuse  Encounter for general adult medical examination with abnormal findings -     CBC with Differential/Platelet -     CMP14+EGFR -     Lipid panel  Encounter for smoking cessation counseling   Ligaya is a 78 year old Caucasian female seen today for chronic disease management, no acute distress Underweight (BMI 17.53):  Chronic low weight with potential nutritional deficiencies or frailty. Decreased appetite: Likely contributing to underweight status, with possible psychological or physical causes. Plan: Appetite stimulation: Consider mirtazapine or megestrol acetate as alternatives to SSRIs for appetite enhancement, she declines Nutritional support: Recommend calorie-dense foods, possibly with a referral to a dietitian. Monitor frailty: Regular weight checks and strength assessments.  Hyperlipidemia: continue Lipitor 20 mg daily, no refill needed today  HTN: Continue amlodipine 5 mg daily, no refill needed today  Allergic rhinitis: Continue Xyzal 5 mg and Singulair 10 mg, singular refilled today  Valium refill: Client is aware the writer  will not refill continue  Valium since she does not have a diagnosis of anxiety or depression and refuses referral to psychiatry.  She is instructed to discuss the need for Valium with chronic pain management . Labs: Lipid, CBC, CMP, result pending        Encourage healthy lifestyle choices, including diet ([place]ell[place] in fruits, vegetables, and lean proteins, and low in salt and simple carbohydrates) and exercise (at least 30 minutes of moderate physical activity daily).     The above assessment and management plan was discussed with the patient. The patient verbalized understanding of and has agreed to the management plan. Patient is aware to call the clinic if they develop any new symptoms or if symptoms persist or worsen. Patient is aware when to return to the clinic for a follow-up visit. Patient educated on when it is appropriate to go  to the emergency department.  Return in about 6 months (around 04/26/2024) for chronic disease management.    Arrie Aran Santa Lighter, [facility] [address] [place], [place] [address] [phone]    Note: This document was prepared by [employer]ubin [employer] voice dictation technology and any errors that results  from this process are unintentional.

## 2023-10-22 ENCOUNTER — Ambulatory Visit: Payer: 59 | Admitting: Nurse Practitioner

## 2023-10-28 ENCOUNTER — Encounter: Payer: Self-pay | Admitting: Nurse Practitioner

## 2023-10-28 ENCOUNTER — Ambulatory Visit: Payer: 59 | Admitting: Nurse Practitioner

## 2023-10-28 VITALS — BP 121/80 | HR 119 | Temp 97.8°F | Ht 66.0 in | Wt 108.6 lb

## 2023-10-28 DIAGNOSIS — Z720 Tobacco use: Secondary | ICD-10-CM | POA: Diagnosis not present

## 2023-10-28 DIAGNOSIS — E782 Mixed hyperlipidemia: Secondary | ICD-10-CM | POA: Diagnosis not present

## 2023-10-28 DIAGNOSIS — Z716 Tobacco abuse counseling: Secondary | ICD-10-CM | POA: Insufficient documentation

## 2023-10-28 DIAGNOSIS — G43009 Migraine without aura, not intractable, without status migrainosus: Secondary | ICD-10-CM

## 2023-10-28 DIAGNOSIS — Z681 Body mass index (BMI) 19 or less, adult: Secondary | ICD-10-CM | POA: Diagnosis not present

## 2023-10-28 DIAGNOSIS — J302 Other seasonal allergic rhinitis: Secondary | ICD-10-CM

## 2023-10-28 DIAGNOSIS — E559 Vitamin D deficiency, unspecified: Secondary | ICD-10-CM

## 2023-10-28 DIAGNOSIS — Z0001 Encounter for general adult medical examination with abnormal findings: Secondary | ICD-10-CM | POA: Diagnosis not present

## 2023-10-28 DIAGNOSIS — R636 Underweight: Secondary | ICD-10-CM | POA: Diagnosis not present

## 2023-10-28 DIAGNOSIS — I10 Essential (primary) hypertension: Secondary | ICD-10-CM

## 2023-10-28 DIAGNOSIS — F1120 Opioid dependence, uncomplicated: Secondary | ICD-10-CM

## 2023-10-28 LAB — LIPID PANEL

## 2023-10-28 MED ORDER — MONTELUKAST SODIUM 10 MG PO TABS
10.0000 mg | ORAL_TABLET | Freq: Every day | ORAL | 1 refills | Status: DC
Start: 2023-10-28 — End: 2024-04-14

## 2023-10-29 ENCOUNTER — Telehealth: Payer: Self-pay

## 2023-10-29 LAB — CBC WITH DIFFERENTIAL/PLATELET
Basophils Absolute: 0.1 10*3/uL (ref 0.0–0.2)
Basos: 1 %
EOS (ABSOLUTE): 0.1 10*3/uL (ref 0.0–0.4)
Eos: 2 %
Hematocrit: 40.8 % (ref 34.0–46.6)
Hemoglobin: 13.9 g/dL (ref 11.1–15.9)
Immature Grans (Abs): 0 10*3/uL (ref 0.0–0.1)
Immature Granulocytes: 1 %
Lymphocytes Absolute: 1.5 10*3/uL (ref 0.7–3.1)
Lymphs: 19 %
MCH: 33.3 pg — ABNORMAL HIGH (ref 26.6–33.0)
MCHC: 34.1 g/dL (ref 31.5–35.7)
MCV: 98 fL — ABNORMAL HIGH (ref 79–97)
Monocytes Absolute: 0.7 10*3/uL (ref 0.1–0.9)
Monocytes: 9 %
Neutrophils Absolute: 5.8 10*3/uL (ref 1.4–7.0)
Neutrophils: 68 %
Platelets: 226 10*3/uL (ref 150–450)
RBC: 4.18 x10E6/uL (ref 3.77–5.28)
RDW: 14.1 % (ref 11.7–15.4)
WBC: 8.3 10*3/uL (ref 3.4–10.8)

## 2023-10-29 LAB — CMP14+EGFR
ALT: 24 IU/L (ref 0–32)
AST: 30 IU/L (ref 0–40)
Albumin: 4.6 g/dL (ref 3.8–4.8)
Alkaline Phosphatase: 94 IU/L (ref 44–121)
BUN/Creatinine Ratio: 12 (ref 12–28)
BUN: 10 mg/dL (ref 8–27)
Bilirubin Total: 0.5 mg/dL (ref 0.0–1.2)
CO2: 24 mmol/L (ref 20–29)
Calcium: 9.1 mg/dL (ref 8.7–10.3)
Chloride: 99 mmol/L (ref 96–106)
Creatinine, Ser: 0.82 mg/dL (ref 0.57–1.00)
Globulin, Total: 2.3 g/dL (ref 1.5–4.5)
Glucose: 96 mg/dL (ref 70–99)
Potassium: 4.1 mmol/L (ref 3.5–5.2)
Sodium: 139 mmol/L (ref 134–144)
Total Protein: 6.9 g/dL (ref 6.0–8.5)
eGFR: 73 mL/min/{1.73_m2} (ref >59)

## 2023-10-29 LAB — LIPID PANEL
Cholesterol, Total: 160 mg/dL (ref 100–199)
HDL: 55 mg/dL (ref >39)
LDL CALC COMMENT:: 2.9 ratio (ref 0.0–4.4)
LDL Chol Calc (NIH): 84 mg/dL (ref 0–99)
Triglycerides: 118 mg/dL (ref 0–149)
VLDL Cholesterol Cal: 21 mg/dL (ref 5–40)

## 2023-10-29 NOTE — Telephone Encounter (Signed)
Wanted to note that it looks like patient was already established with Dr.Crawford and might have just been seeing NP Evern Bio for phychiatric reasons.

## 2023-10-29 NOTE — Telephone Encounter (Signed)
Copied from CRM [id number]. Topic: Appointments - Transfer of Care >> Oct 29, 2023 11:41 AM Elizebeth Brooking wrote: Pt is requesting to transfer FROM: Martina Sinner, NP Pt is requesting to transfer TO: Myrlene Broker, MD Reason for requested transfer: [facility] and would like to be back in her care  It is the responsibility of the team the patient would like to transfer to (Dr. Delight Stare) to reach out to the patient if for any reason this transfer is not acceptable.

## 2023-11-09 ENCOUNTER — Other Ambulatory Visit: Payer: Self-pay | Admitting: Internal Medicine

## 2023-11-09 DIAGNOSIS — E782 Mixed hyperlipidemia: Secondary | ICD-10-CM

## 2023-11-09 NOTE — Progress Notes (Deleted)
 Patient name: [name] MRN: [id number] DOB: March 03, 1946 Sex: female  REASON FOR VISIT: Evaluate for mesenteric artery disease  HPI: [name] is a 78 y.o. 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  She is well-known to our practice and previously had an EVAR on 04/20/2023 for 5.2 cm AAA.  She is known to have a type II endoleak.  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  Past Medical History:  Diagnosis Date   AAA (abdominal aortic aneurysm) (HCC)    saw Dr Hart [place] 11/2013.  2.7 cm   Arthritis    Chicken pox    COPD (chronic obstructive pulmonary disease) (HCC)    smokes 1/2ppd   DDD (degenerative disc disease), lumbar    GERD (gastroesophageal reflux disease)    diet controlled   Headache    Hx of migraines   History of blood transfusion    History of lumbar fusion 08/09/2014   Hyperlipidemia    IBS (irritable bowel syndrome)    diet contolled   Mild mitral regurgitation    Peripheral vascular disease (HCC)    AAA   RBBB    S/P arthroscopy of right shoulder 12/15/2017   Seasonal allergies    Varicose veins     Past Surgical History:  Procedure Laterality Date   ABDOMINAL AORTIC ENDOVASCULAR STENT GRAFT Bilateral 04/20/2023   Procedure: ABDOMINAL AORTIC ENDOVASCULAR STENT GRAFT;  Surgeon: Cephus Shelling, MD;  Location: [facility] OR;  Service: Vascular;  Laterality: Bilateral;   APPENDECTOMY     BACK SURGERY N/A [id number]   5 back surgeries   BREAST BIOPSY  1989,05   lt br bx   BREAST EXCISIONAL BIOPSY Bilateral    CATARACT EXTRACTION W/ INTRAOCULAR LENS IMPLANT Bilateral 2018   CHOLECYSTECTOMY     COLONOSCOPY     EXCISION/RELEASE BURSA HIP Right 05/12/2013   Procedure: EXCISION RIGHT TROCHANTERIC BURSA/CALCIFICATION ;  Surgeon: Loreta Ave, MD;  Location: [facility];  Service: Orthopedics;  Laterality:  Right;   FASCIOTOMY Right 05/12/2013   Procedure: RIGHT FASCIOTOMY HIP ANY TYPE;  Surgeon: Loreta Ave, MD;  Location: [facility];  Service: Orthopedics;  Laterality: Right;   LARYNGOSCOPY AND BRONCHOSCOPY N/A 08/15/2019   Procedure: BRONCHOSCOPY/LARYNGOSCOPY;  Surgeon: Newman Pies, MD;  Location: [facility];  Service: ENT;  Laterality: N/A;   NM MYOCAR PERF WALL MOTION  04/13/2003   negative   SHOULDER ARTHROSCOPY WITH OPEN ROTATOR CUFF REPAIR Right 2018   skin cancer removed right leg     TONSILLECTOMY AND ADENOIDECTOMY  1950   TOTAL HIP ARTHROPLASTY Left 2006   lt total hip   TOTAL HIP ARTHROPLASTY Right 2011   TOTAL HIP ARTHROPLASTY Right 2016   second surgery due to recall   ULTRASOUND GUIDANCE FOR VASCULAR ACCESS Bilateral 04/20/2023   Procedure: ULTRASOUND GUIDANCE FOR VASCULAR ACCESS;  Surgeon: Cephus Shelling, MD;  Location: [facility] OR;  Service: Vascular;  Laterality: Bilateral;   US ECHOCARDIOGRAPHY  04/13/2003   EF 65%, mild TR w/suggestion of mild pulmonary hypertension,thickened anterior leaflet,central mild MR.   VAGINAL HYSTERECTOMY  1993    Family History  Problem Relation Age of Onset   Early death Mother        aortic aneurysm   Vasculitis Mother        temporal arteritis   Aortic  aneurysm Mother    Heart disease Mother        before age 82   Hyperlipidemia Mother    Hypertension Mother    COPD Sister    Hypertension Sister    Depression Sister    Heart disease Sister        before age 71   Post-traumatic stress disorder Sister    Bipolar disorder Sister    Brain cancer Maternal Aunt 41       brain   Heart disease Maternal Grandmother        aortic aneurysm   Aortic aneurysm Maternal Grandmother    Hypertension Sister    Throat cancer Maternal Grandfather    Breast cancer Neg Hx     SOCIAL HISTORY: Social History   Tobacco Use   Smoking status: Every Day    Current packs/day: 0.75    Average packs/day: 0.8  packs/day for 30.0 years (22.5 ttl pk-yrs)    Types: Cigarettes   Smokeless tobacco: Never   Tobacco comments:    "Cut way back" about 4-5 cigs/day  Substance Use Topics   Alcohol use: Not Currently    Alcohol/week: 0.0 standard drinks of alcohol    Comment: very occasional, not monthly    Allergies  Allergen Reactions   Bee Venom     Other reaction(s): Redness   Cephalexin Other (See Comments)    Headaches   Gabapentin Diarrhea    Current Outpatient Medications  Medication Sig Dispense Refill   amLODipine (NORVASC) 5 MG tablet Take 1 tablet (5 mg total) by mouth daily. 90 tablet 3   atorvastatin (LIPITOR) 20 MG tablet Take 1 tablet (20 mg total) by mouth daily. 90 tablet 3   Cobalamin Combinations (NEURIVA PLUS PO) Take 1 capsule by mouth daily. (Patient not taking: Reported on 10/28/2023)     Cobalamin Combinations (NEURIVA PLUS) CHEW Chew by mouth. (Patient not taking: Reported on 10/28/2023)     cyclobenzaprine (FLEXERIL) 5 MG tablet Take 1 tablet (5 mg total) by mouth 3 (three) times daily as needed for muscle spasms. 180 tablet 3   diphenoxylate-atropine (LOMOTIL) 2.5-0.025 MG tablet Take 1 tablet by mouth 2 (two) times daily as needed for diarrhea or loose stools. 45 tablet 0   levocetirizine (XYZAL) 5 MG tablet Take 5 mg by mouth daily as needed for allergies. (Patient not taking: Reported on 10/28/2023)     megestrol (MEGACE) 400 MG/10ML suspension Take 10 mLs (400 mg total) by mouth daily. (Patient not taking: Reported on 10/28/2023) 300 mL 11   montelukast (SINGULAIR) 10 MG tablet Take 1 tablet (10 mg total) by mouth at bedtime. 90 tablet 1   Multiple Vitamin (MULTIVITAMIN) tablet Take 1 tablet by mouth daily. (Patient not taking: Reported on 10/28/2023)     Multiple Vitamins-Minerals (HAIR SKIN & NAILS) TABS Take 1 tablet by mouth daily. (Patient not taking: Reported on 10/28/2023)     promethazine (PHENERGAN) 12.5 MG tablet Take 12.5 mg by mouth every 6 (six) hours as needed  for nausea or vomiting.     vitamin B-12 (CYANOCOBALAMIN) 1000 MCG tablet Take 1 tablet (1,000 mcg total) by mouth daily. (Patient not taking: Reported on 10/28/2023) 100 tablet 2   No current facility-administered medications for this visit.    REVIEW OF SYSTEMS:  [X]  denotes positive finding, [ ]  denotes negative finding Cardiac  Comments:  Chest pain or chest pressure: ***   Shortness of breath upon exertion:    Short of breath when lying  flat:    Irregular heart rhythm:        Vascular    Pain in calf, thigh, or hip brought on by ambulation:    Pain in feet at night that wakes you up from your sleep:     Blood clot in your veins:    Leg swelling:         Pulmonary    Oxygen at home:    Productive cough:     Wheezing:         Neurologic    Sudden weakness in arms or legs:     Sudden numbness in arms or legs:     Sudden onset of difficulty speaking or slurred speech:    Temporary loss of vision in one eye:     Problems with dizziness:         Gastrointestinal    Blood in stool:     Vomited blood:         Genitourinary    Burning when urinating:     Blood in urine:        Psychiatric    Major depression:         Hematologic    Bleeding problems:    Problems with blood clotting too easily:        Skin    Rashes or ulcers:        Constitutional    Fever or chills:      PHYSICAL EXAM: There were no vitals filed for this visit.  GENERAL: The patient is a well-nourished female, in no acute distress. The vital signs are documented above. CARDIAC: There is a regular rate and rhythm.  VASCULAR: *** PULMONARY: There is good air exchange bilaterally without wheezing or rales. ABDOMEN: Soft and non-tender with normal pitched bowel sounds.  MUSCULOSKELETAL: There are no major deformities or cyanosis. NEUROLOGIC: No focal weakness or paresthesias are detected. SKIN: There are no ulcers or rashes noted. PSYCHIATRIC: The patient has a normal affect.  DATA:    Ultrasound mesenteric  Assessment/Plan:   78 y.o. 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  She is well-known to our practice and previously had an EVAR on 04/20/2023 for 5.2 cm AAA.  She is known to have a type II endoleak.  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   Cephus Shelling, MD [facility] Office: [phone]

## 2023-11-10 ENCOUNTER — Ambulatory Visit: Payer: 59 | Admitting: Vascular Surgery

## 2023-11-10 ENCOUNTER — Ambulatory Visit (HOSPITAL_COMMUNITY): Admission: RE | Admit: 2023-11-10 | Payer: 59 | Source: Ambulatory Visit

## 2023-11-16 ENCOUNTER — Encounter: Payer: Self-pay | Admitting: Internal Medicine

## 2023-11-16 ENCOUNTER — Ambulatory Visit (INDEPENDENT_AMBULATORY_CARE_PROVIDER_SITE_OTHER): Payer: 59 | Admitting: Internal Medicine

## 2023-11-16 VITALS — BP 120/64 | HR 105 | Temp 99.2°F | Ht 66.0 in | Wt 111.0 lb

## 2023-11-16 DIAGNOSIS — S81811A Laceration without foreign body, right lower leg, initial encounter: Secondary | ICD-10-CM | POA: Insufficient documentation

## 2023-11-16 DIAGNOSIS — Z720 Tobacco use: Secondary | ICD-10-CM | POA: Diagnosis not present

## 2023-11-16 DIAGNOSIS — L03115 Cellulitis of right lower limb: Secondary | ICD-10-CM | POA: Diagnosis not present

## 2023-11-16 DIAGNOSIS — E559 Vitamin D deficiency, unspecified: Secondary | ICD-10-CM

## 2023-11-16 MED ORDER — DOXYCYCLINE HYCLATE 100 MG PO TABS: 100.0000 mg | ORAL_TABLET | Freq: Two times a day (BID) | ORAL | 0 refills | Status: DC

## 2023-11-16 NOTE — Patient Instructions (Signed)
 Please take all new medication as prescribed -the antibiotic  Please continue your dressing changes and consider adding vaseline with gauze   Please continue all other medications as before, and refills have been done if requested.  Please have the pharmacy call with any other refills you may need.  Please keep your appointments with your specialists as you may have planned

## 2023-11-16 NOTE — Assessment & Plan Note (Signed)
 Pt counseled to quit, pt not ready

## 2023-11-16 NOTE — Assessment & Plan Note (Signed)
 Last vitamin D Lab Results  Component Value Date   VD25OH 49.02 06/15/2023   Stable, cont oral replacement

## 2023-11-16 NOTE — Assessment & Plan Note (Signed)
 For routine wound care including vaseline, gauze

## 2023-11-16 NOTE — Assessment & Plan Note (Signed)
 Early mild peri skin tearing, no abscess, for doxycyline 100 bid

## 2023-11-16 NOTE — Progress Notes (Signed)
 Patient ID: [name], female   DOB: 15-Mar-1946, 78 y.o.   MRN: [id number]        Chief Complaint: follow up right leg infection post skin tear injury, low vit d, cellulitis, smoker        HPI:  [name] is a 78 y.o. 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  No red streaks, fever, chills, drainage.  Pt denies chest pain, increased sob or doe, wheezing, orthopnea, PND, increased LE swelling, palpitations, dizziness or syncope.   Pt denies polydipsia, polyuria, or new focal neuro s/s.   Pt still smoking, not ready to quit       Wt Readings from Last 3 Encounters:  11/16/23 111 lb (50.3 kg)  10/28/23 108 lb 9.6 oz (49.3 kg)  09/15/23 108 lb 6.4 oz (49.2 kg)   BP Readings from Last 3 Encounters:  11/16/23 120/64  10/28/23 121/80  09/15/23 (!) 164/76         Past Medical History:  Diagnosis Date   AAA (abdominal aortic aneurysm) (HCC)    saw Dr Hart Rochester 11/2013.  2.7 cm   Arthritis    Chicken pox    COPD (chronic obstructive pulmonary disease) (HCC)    smokes 1/2ppd   DDD (degenerative disc disease), lumbar    GERD (gastroesophageal reflux disease)    diet controlled   Headache    Hx of migraines   History of blood transfusion    History of lumbar fusion 08/09/2014   Hyperlipidemia    IBS (irritable bowel syndrome)    diet contolled   Mild mitral regurgitation    Peripheral vascular disease (HCC)    AAA   RBBB    S/P arthroscopy of right shoulder 12/15/2017   Seasonal allergies    Varicose veins    Past Surgical History:  Procedure Laterality Date   ABDOMINAL AORTIC ENDOVASCULAR STENT GRAFT Bilateral 04/20/2023    Procedure: ABDOMINAL AORTIC ENDOVASCULAR STENT GRAFT;  Surgeon: Cephus Shelling, MD;  Location: [facility] OR;  Service: Vascular;  Laterality: Bilateral;   APPENDECTOMY     BACK SURGERY N/A [phone]   5 back surgeries   BREAST BIOPSY  1989,05   lt br bx   BREAST EXCISIONAL BIOPSY Bilateral    CATARACT EXTRACTION W/ INTRAOCULAR LENS IMPLANT Bilateral 2018   CHOLECYSTECTOMY     COLONOSCOPY     EXCISION/RELEASE BURSA HIP Right 05/12/2013   Procedure: EXCISION RIGHT TROCHANTERIC BURSA/CALCIFICATION ;  Surgeon: Loreta Ave, MD;  Location: [facility];  Service: Orthopedics;  Laterality: Right;   FASCIOTOMY Right 05/12/2013   Procedure: RIGHT FASCIOTOMY HIP ANY TYPE;  Surgeon: Loreta Ave, MD;  Location: [facility];  Service: Orthopedics;  Laterality: Right;   LARYNGOSCOPY AND BRONCHOSCOPY N/A 08/15/2019   Procedure: BRONCHOSCOPY/LARYNGOSCOPY;  Surgeon: Newman Pies, MD;  Location: [facility];  Service: ENT;  Laterality: N/A;   NM MYOCAR PERF WALL MOTION  04/13/2003   negative   SHOULDER ARTHROSCOPY WITH OPEN ROTATOR CUFF REPAIR Right 2018   skin cancer removed right leg     TONSILLECTOMY AND ADENOIDECTOMY  1950   TOTAL HIP ARTHROPLASTY Left 2006   lt total hip   TOTAL HIP ARTHROPLASTY Right 2011   TOTAL HIP ARTHROPLASTY Right 2016   second surgery due to recall   ULTRASOUND GUIDANCE FOR VASCULAR ACCESS Bilateral 04/20/2023   Procedure: ULTRASOUND GUIDANCE FOR VASCULAR ACCESS;  Surgeon: Cephus Shelling, MD;  Location: [facility] OR;  Service: Vascular;  Laterality: Bilateral;   US ECHOCARDIOGRAPHY  04/13/2003   EF 65%, mild TR w/suggestion of mild pulmonary hypertension,thickened anterior leaflet,central mild MR.   VAGINAL HYSTERECTOMY  1993    reports that she has been smoking cigarettes. She has a 22.5 pack-year smoking history. She has never used smokeless tobacco. She reports that she does not currently use alcohol. She reports that she does  not use drugs. family history includes Aortic aneurysm in her maternal grandmother and mother; Bipolar disorder in her sister; Brain cancer (age of onset: 29) in her maternal aunt; COPD in her sister; Depression in her sister; Early death in her mother; Heart disease in her maternal grandmother, mother, and sister; Hyperlipidemia in her mother; Hypertension in her mother, sister, and sister; Post-traumatic stress disorder in her sister; Throat cancer in her maternal grandfather; Vasculitis in her mother. Allergies  Allergen Reactions   Bee Venom     Other reaction(s): Redness   Cephalexin Other (See Comments)    Headaches   Gabapentin Diarrhea   Current Outpatient Medications on File Prior to Visit  Medication Sig Dispense Refill   amLODipine (NORVASC) 5 MG tablet Take 1 tablet (5 mg total) by mouth daily. 90 tablet 3   atorvastatin (LIPITOR) 20 MG tablet TAKE ONE TABLET DAILY 90 tablet 0   cyclobenzaprine (FLEXERIL) 5 MG tablet Take 1 tablet (5 mg total) by mouth 3 (three) times daily as needed for muscle spasms. 180 tablet 3   diphenoxylate-atropine (LOMOTIL) 2.5-0.025 MG tablet Take 1 tablet by mouth 2 (two) times daily as needed for diarrhea or loose stools. 45 tablet 0   montelukast (SINGULAIR) 10 MG tablet Take 1 tablet (10 mg total) by mouth at bedtime. 90 tablet 1   promethazine (PHENERGAN) 12.5 MG tablet Take 12.5 mg by mouth every 6 (six) hours as needed for nausea or vomiting.     Cobalamin Combinations (NEURIVA PLUS PO) Take 1 capsule by mouth daily. (Patient not taking: Reported on 10/28/2023)     Cobalamin Combinations (NEURIVA PLUS) CHEW Chew by mouth. (Patient not taking: Reported on 11/16/2023)     levocetirizine (XYZAL) 5 MG tablet Take 5 mg by mouth daily as needed for allergies. (Patient not taking: Reported on 10/28/2023)     megestrol (MEGACE) 400 MG/10ML suspension Take 10 mLs (400 mg total) by mouth daily. (Patient not taking: Reported on 11/16/2023) 300 mL 11   Multiple  Vitamin (MULTIVITAMIN) tablet Take 1 tablet by mouth daily. (Patient not taking: Reported on 10/28/2023)     Multiple Vitamins-Minerals (HAIR SKIN & NAILS) TABS Take 1 tablet by mouth daily. (Patient not taking: Reported on 10/28/2023)     vitamin B-12 (CYANOCOBALAMIN) 1000 MCG tablet Take 1 tablet (1,000 mcg total) by mouth daily. (Patient not taking: Reported on 10/28/2023) 100 tablet 2   No current facility-administered medications on file prior to visit.        ROS:  All others reviewed and negative.  Objective        PE:  BP 120/64 (BP Location: Right Arm, Patient Position: Sitting, Cuff Size: Normal)   Pulse (!) 105   Temp 99.2  F (37.3 C) (Oral)   Ht 5\' 6"  (1.676 m)   Wt 111 lb (50.3 kg)   SpO2 92%   BMI 17.92 kg/m                 Constitutional: Pt appears in NAD               HENT: Head: NCAT.                Right Ear: External ear normal.                 Left Ear: External ear normal.                Eyes: . Pupils are equal, round, and reactive to light. Conjunctivae and EOM are normal               Nose: without d/c or deformity               Neck: Neck supple. Gross normal ROM               Cardiovascular: Normal rate and regular rhythm.                 Pulmonary/Chest: Effort normal and breath sounds without rales or wheezing.                Abd:  Soft, NT, ND, + BS, no organomegaly               Neurological: Pt is alert. At baseline orientation, motor grossly intact               Skin: Skin is warm. RLE edema - none, but has RLE skin tear wound with edges intact no bleeding approx 1.5 cm about 2 cm proximal to ankle in the pretibial area, with mild red, swelling tender noted, no drainage or red streaks               Psychiatric: Pt behavior is normal without agitation   Micro: none  Cardiac tracings I have personally interpreted today:  none  Pertinent Radiological findings (summarize): none   Lab Results  Component Value Date   WBC 8.3 10/28/2023   HGB 13.9  10/28/2023   HCT 40.8 10/28/2023   PLT 226 10/28/2023   GLUCOSE 96 10/28/2023   CHOL 160 10/28/2023   TRIG 118 10/28/2023   HDL 55 10/28/2023   LDLDIRECT 140.6 01/31/2013   LDLCALC 84 10/28/2023   ALT 24 10/28/2023   AST 30 10/28/2023   NA 139 10/28/2023   K 4.1 10/28/2023   CL 99 10/28/2023   CREATININE 0.82 10/28/2023   BUN 10 10/28/2023   CO2 24 10/28/2023   TSH 0.97 06/15/2023   INR 1.0 04/14/2023   Assessment/Plan:  [name] is a 78 y.o. White or Caucasian [1] female with  has a past medical history of AAA (abdominal aortic aneurysm) (HCC), Arthritis, Chicken pox, COPD (chronic obstructive pulmonary disease) (HCC), DDD (degenerative disc disease), lumbar, GERD (gastroesophageal reflux disease), Headache, History of blood transfusion, History of lumbar fusion (08/09/2014), Hyperlipidemia, IBS (irritable bowel syndrome), Mild mitral regurgitation, Peripheral vascular disease (HCC), RBBB, S/P arthroscopy of right shoulder (12/15/2017), Seasonal allergies, and Varicose veins.  Vitamin D deficiency Last vitamin D Lab Results  Component Value Date   VD25OH 49.02 06/15/2023   Stable, cont oral replacement   Skin tear of right lower leg without complication For routine wound care including vaseline, gauze  Cellulitis  of leg, right Early mild peri skin tearing, no abscess, for doxycyline 100 bid  Tobacco abuse Pt counseled to quit, pt not ready  Followup: Return if symptoms worsen or fail to improve.  Oliver Barre, MD 11/16/2023 9:25 PM [facility]

## 2023-11-17 DIAGNOSIS — M47817 Spondylosis without myelopathy or radiculopathy, lumbosacral region: Secondary | ICD-10-CM | POA: Diagnosis not present

## 2023-12-01 ENCOUNTER — Ambulatory Visit (HOSPITAL_COMMUNITY)
Admission: RE | Admit: 2023-12-01 | Discharge: 2023-12-01 | Disposition: A | Discharge status: Home / Self Care | Payer: 59 | Source: Ambulatory Visit | Attending: Vascular Surgery | Admitting: Vascular Surgery

## 2023-12-01 ENCOUNTER — Ambulatory Visit: Payer: 59 | Admitting: Vascular Surgery

## 2023-12-01 DIAGNOSIS — I7143 Infrarenal abdominal aortic aneurysm, without rupture: Secondary | ICD-10-CM | POA: Diagnosis not present

## 2023-12-29 ENCOUNTER — Encounter: Payer: Self-pay | Admitting: Internal Medicine

## 2023-12-29 ENCOUNTER — Ambulatory Visit (INDEPENDENT_AMBULATORY_CARE_PROVIDER_SITE_OTHER): Admitting: Internal Medicine

## 2023-12-29 VITALS — BP 140/98 | HR 110 | Temp 98.1°F | Ht 66.0 in | Wt 111.0 lb

## 2023-12-29 DIAGNOSIS — I10 Essential (primary) hypertension: Secondary | ICD-10-CM | POA: Insufficient documentation

## 2023-12-29 LAB — CBC
HCT: 44.2 % (ref 36.0–46.0)
Hemoglobin: 15.2 g/dL — ABNORMAL HIGH (ref 12.0–15.0)
MCHC: 34.4 g/dL (ref 30.0–36.0)
MCV: 100.4 fl — ABNORMAL HIGH (ref 78.0–100.0)
Platelets: 211 10*3/uL (ref 150.0–400.0)
RBC: 4.4 Mil/uL (ref 3.87–5.11)
RDW: 13.5 % (ref 11.5–15.5)
WBC: 8.4 10*3/uL (ref 4.0–10.5)

## 2023-12-29 LAB — COMPREHENSIVE METABOLIC PANEL WITH GFR
ALT: 14 U/L (ref 0–35)
AST: 24 U/L (ref 0–37)
Albumin: 4.7 g/dL (ref 3.5–5.2)
Alkaline Phosphatase: 73 U/L (ref 39–117)
BUN: 13 mg/dL (ref 6–23)
CO2: 31 meq/L (ref 19–32)
Calcium: 9.7 mg/dL (ref 8.4–10.5)
Chloride: 98 meq/L (ref 96–112)
Creatinine, Ser: 0.81 mg/dL (ref 0.40–1.20)
GFR: 69.02 mL/min (ref >60.00)
Glucose, Bld: 97 mg/dL (ref 70–99)
Potassium: 4.1 meq/L (ref 3.5–5.1)
Sodium: 136 meq/L (ref 135–145)
Total Bilirubin: 0.6 mg/dL (ref 0.2–1.2)
Total Protein: 7.7 g/dL (ref 6.0–8.3)

## 2023-12-29 LAB — BRAIN NATRIURETIC PEPTIDE: Pro B Natriuretic peptide (BNP): 33 pg/mL (ref 0.0–100.0)

## 2023-12-29 MED ORDER — AMLODIPINE BESYLATE 10 MG PO TABS: 10.0000 mg | ORAL_TABLET | Freq: Every day | ORAL | 3 refills | Status: DC

## 2023-12-29 MED ORDER — DIAZEPAM 5 MG PO TABS: 5.0000 mg | ORAL_TABLET | Freq: Every day | ORAL | 1 refills | Status: DC | PRN

## 2023-12-29 NOTE — Assessment & Plan Note (Signed)
 BP elevated today. Increase amlodipine to 10 mg daily. Checking CBC, CMP, BNP for swelling in legs. Discussed that amlodipine can cause ankle swelling.

## 2023-12-29 NOTE — Patient Instructions (Signed)
 We will increase the amlodipine to 10 mg daily. You can take 2 pills daily until you run out.

## 2023-12-29 NOTE — Progress Notes (Signed)
   Subjective:   Patient ID: [name], female    DOB: 04-12-1946, 78 y.o.   MRN: [id number]  HPI The patient is a 78 YO female coming in for swelling in ankles and BP labile last month or so. No change to diet, lifestyle, smoking, medications. Takes amlodipine regularly.   Review of Systems  Constitutional: Negative.   HENT: Negative.    Eyes: Negative.   Respiratory:  Negative for cough, chest tightness and shortness of breath.   Cardiovascular:  Negative for chest pain, palpitations and leg swelling.  Gastrointestinal:  Negative for abdominal distention, abdominal pain, constipation, diarrhea, nausea and vomiting.  Musculoskeletal: Negative.   Skin: Negative.   Neurological: Negative.   Psychiatric/Behavioral: Negative.      Objective:  Physical Exam Constitutional:      Appearance: She is well-developed.  HENT:     Head: Normocephalic and atraumatic.  Cardiovascular:     Rate and Rhythm: Normal rate and regular rhythm.  Pulmonary:     Effort: Pulmonary effort is normal. No respiratory distress.     Breath sounds: Normal breath sounds. No wheezing or rales.  Abdominal:     General: Bowel sounds are normal. There is no distension.     Palpations: Abdomen is soft.     Tenderness: There is no abdominal tenderness. There is no rebound.  Musculoskeletal:     Cervical back: Normal range of motion.  Skin:    General: Skin is warm and dry.  Neurological:     Mental Status: She is alert and oriented to person, place, and time.     Coordination: Coordination normal.     Vitals:   12/29/23 1505  BP: (!) 140/98  Pulse: (!) 110  Temp: 98.1 F (36.7 C)  TempSrc: Oral  SpO2: 97%  Weight: 111 lb (50.3 kg)  Height: 5\' 6"  (1.676 m)    Assessment & Plan:

## 2024-01-11 NOTE — Progress Notes (Unsigned)
 Patient name: [name] MRN: [id number] DOB: 03/13/1946 Sex: female  REASON FOR VISIT: Follow-up for mesenteric artery disease  HPI: [name] is a 78 y.o. female presents for evaluation of mesenteric artery disease.  She is well-known to our practice and previously had an EVAR on 04/20/2023 for 5.2 cm AAA.  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  Has been complaining of decreased appetite with weight loss on her last visit with our PA.  Today she denies any postprandial abdominal pain.  Just feels that she has no appetite.  Has felt bloated as well.  Past Medical History:  Diagnosis Date   AAA (abdominal aortic aneurysm) (HCC)    saw Dr Hart Rochester 11/2013.  2.7 cm   Arthritis    Chicken pox    COPD (chronic obstructive pulmonary disease) (HCC)    smokes 1/2ppd   DDD (degenerative disc disease), lumbar    GERD (gastroesophageal reflux disease)    diet controlled   Headache    Hx of migraines   History of blood transfusion    History of lumbar fusion 08/09/2014   Hyperlipidemia    IBS (irritable bowel syndrome)    diet contolled   Mild mitral regurgitation    Peripheral vascular disease (HCC)    AAA   RBBB    S/P arthroscopy of right shoulder 12/15/2017   Seasonal allergies    Varicose veins     Past Surgical History:  Procedure Laterality Date   ABDOMINAL AORTIC ENDOVASCULAR STENT GRAFT Bilateral 04/20/2023   Procedure: ABDOMINAL AORTIC ENDOVASCULAR STENT GRAFT;  Surgeon: Cephus Shelling, MD;  Location: [facility];  Service: Vascular;  Laterality: Bilateral;   APPENDECTOMY     BACK SURGERY N/A [phone]   5 back surgeries   BREAST BIOPSY  1989,05   lt br bx   BREAST EXCISIONAL BIOPSY Bilateral    CATARACT EXTRACTION W/ INTRAOCULAR LENS IMPLANT Bilateral 2018   CHOLECYSTECTOMY     COLONOSCOPY     EXCISION/RELEASE BURSA HIP Right 05/12/2013   Procedure: EXCISION RIGHT TROCHANTERIC BURSA/CALCIFICATION ;   Surgeon: Loreta Ave, MD;  Location: [facility];  Service: Orthopedics;  Laterality: Right;   FASCIOTOMY Right 05/12/2013   Procedure: RIGHT FASCIOTOMY HIP ANY TYPE;  Surgeon: Loreta Ave, MD;  Location: [facility];  Service: Orthopedics;  Laterality: Right;   LARYNGOSCOPY AND BRONCHOSCOPY N/A 08/15/2019   Procedure: BRONCHOSCOPY/LARYNGOSCOPY;  Surgeon: Newman Pies, MD;  Location: [facility];  Service: ENT;  Laterality: N/A;   NM MYOCAR PERF WALL MOTION  04/13/2003   negative   SHOULDER ARTHROSCOPY WITH OPEN ROTATOR CUFF REPAIR Right 2018   skin cancer removed right leg     TONSILLECTOMY AND ADENOIDECTOMY  1950   TOTAL HIP ARTHROPLASTY Left 2006   lt total hip   TOTAL HIP ARTHROPLASTY Right 2011   TOTAL HIP ARTHROPLASTY Right 2016   second surgery due to recall   ULTRASOUND GUIDANCE FOR VASCULAR ACCESS Bilateral 04/20/2023   Procedure: ULTRASOUND GUIDANCE FOR VASCULAR ACCESS;  Surgeon: Cephus Shelling, MD;  Location: [facility] OR;  Service: Vascular;  Laterality: Bilateral;   US ECHOCARDIOGRAPHY  04/13/2003   EF 65%, mild TR w/suggestion of mild pulmonary hypertension,thickened anterior leaflet,central mild MR.   VAGINAL HYSTERECTOMY  1993    Family History  Problem Relation Age of Onset   Early death Mother        aortic aneurysm  Vasculitis Mother        temporal arteritis   Aortic aneurysm Mother    Heart disease Mother        before age 89   Hyperlipidemia Mother    Hypertension Mother    COPD Sister    Hypertension Sister    Depression Sister    Heart disease Sister        before age 26   Post-traumatic stress disorder Sister    Bipolar disorder Sister    Brain cancer Maternal Aunt 74       brain   Heart disease Maternal Grandmother        aortic aneurysm   Aortic aneurysm Maternal Grandmother    Hypertension Sister    Throat cancer Maternal Grandfather    Breast cancer Neg Hx     SOCIAL HISTORY: Social History    Tobacco Use   Smoking status: Every Day    Current packs/day: 0.75    Average packs/day: 0.8 packs/day for 30.0 years (22.5 ttl pk-yrs)    Types: Cigarettes   Smokeless tobacco: Never   Tobacco comments:    "Cut way back" about 4-5 cigs/day  Substance Use Topics   Alcohol use: Not Currently    Alcohol/week: 0.0 standard drinks of alcohol    Comment: very occasional, not monthly    Allergies  Allergen Reactions   Bee Venom     Other reaction(s): Redness   Cephalexin Other (See Comments)    Headaches   Gabapentin Diarrhea    Current Outpatient Medications  Medication Sig Dispense Refill   amLODipine (NORVASC) 10 MG tablet Take 1 tablet (10 mg total) by mouth daily. 90 tablet 3   atorvastatin (LIPITOR) 20 MG tablet TAKE ONE TABLET DAILY 90 tablet 0   Cobalamin Combinations (NEURIVA PLUS PO) Take 1 capsule by mouth daily. (Patient not taking: Reported on 10/28/2023)     cyclobenzaprine (FLEXERIL) 5 MG tablet Take 1 tablet (5 mg total) by mouth 3 (three) times daily as needed for muscle spasms. 180 tablet 3   diazepam (VALIUM) 5 MG tablet Take 1 tablet (5 mg total) by mouth daily as needed for anxiety. 30 tablet 1   diphenoxylate-atropine (LOMOTIL) 2.5-0.025 MG tablet Take 1 tablet by mouth 2 (two) times daily as needed for diarrhea or loose stools. 45 tablet 0   doxycycline (VIBRA-TABS) 100 MG tablet Take 1 tablet (100 mg total) by mouth 2 (two) times daily. 20 tablet 0   montelukast (SINGULAIR) 10 MG tablet Take 1 tablet (10 mg total) by mouth at bedtime. 90 tablet 1   promethazine (PHENERGAN) 12.5 MG tablet Take 12.5 mg by mouth every 6 (six) hours as needed for nausea or vomiting.     vitamin B-12 (CYANOCOBALAMIN) 1000 MCG tablet Take 1 tablet (1,000 mcg total) by mouth daily. (Patient not taking: Reported on 10/28/2023) 100 tablet 2   No current facility-administered medications for this visit.    REVIEW OF SYSTEMS:  [X]  denotes positive finding, [ ]  denotes negative  finding Cardiac  Comments:  Chest pain or chest pressure:    Shortness of breath upon exertion:    Short of breath when lying flat:    Irregular heart rhythm:        Vascular    Pain in calf, thigh, or hip brought on by ambulation:    Pain in feet at night that wakes you up from your sleep:     Blood clot in your veins:    Leg swelling:  Pulmonary    Oxygen at home:    Productive cough:     Wheezing:         Neurologic    Sudden weakness in arms or legs:     Sudden numbness in arms or legs:     Sudden onset of difficulty speaking or slurred speech:    Temporary loss of vision in one eye:     Problems with dizziness:         Gastrointestinal    Blood in stool:     Vomited blood:         Genitourinary    Burning when urinating:     Blood in urine:        Psychiatric    Major depression:         Hematologic    Bleeding problems:    Problems with blood clotting too easily:        Skin    Rashes or ulcers:        Constitutional    Fever or chills:      PHYSICAL EXAM: There were no vitals filed for this visit.  GENERAL: The patient is a well-nourished female, in no acute distress. The vital signs are documented above. CARDIAC: There is a regular rate and rhythm.  VASCULAR:  Bilateral femoral pulses palpable PULMONARY: No respiratory distress. ABDOMEN: Soft and non-tender.  Distended but soft. MUSCULOSKELETAL: There are no major deformities or cyanosis. NEUROLOGIC: No focal weakness or paresthesias are detected. SKIN: There are no ulcers or rashes noted. PSYCHIATRIC: The patient has a normal affect.  DATA:   Mesenteric duplex shows no focal flow-limiting stenosis in the celiac or SMA with a known 1.3 cm celiac artery aneurysm  EVAR duplex 12/01/2023 shows no endoleak with 5.0 cm aneurysm and patent stent graft  Assessment/Plan:  78 y.o. female presents for evaluation of mesenteric artery disease.  She is well-known to our practice and previously had  an EVAR on 04/20/2023 for 5.2 cm AAA.  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  Has been complaining of decreased appetite.  I discussed no evidence of high-grade flow-limiting stenosis in the celiac or SMA that would warrant intervention.  Her symptoms are really not consistent with chronic mesenteric ischemia as she denies any postprandial abdominal pain as would be expected.  She just has no appetite.  I do not see any role for vascular intervention at this time.  Her celiac aneurysm is small at 1.3 cm and we will follow this.  I will see her in 6 months with EVAR duplex for ongoing surveillance.   Young Hensen, MD [facility] Office: [phone]

## 2024-01-12 ENCOUNTER — Ambulatory Visit (HOSPITAL_COMMUNITY)
Admission: RE | Admit: 2024-01-12 | Discharge: 2024-01-12 | Disposition: A | Discharge status: Home / Self Care | Payer: 59 | Source: Ambulatory Visit | Attending: Vascular Surgery | Admitting: Vascular Surgery

## 2024-01-12 ENCOUNTER — Encounter: Payer: Self-pay | Admitting: Vascular Surgery

## 2024-01-12 ENCOUNTER — Ambulatory Visit (INDEPENDENT_AMBULATORY_CARE_PROVIDER_SITE_OTHER): Payer: 59 | Admitting: Vascular Surgery

## 2024-01-12 VITALS — BP 116/69 | HR 63 | Temp 97.6°F | Resp 18 | Ht 66.0 in | Wt 115.8 lb

## 2024-01-12 DIAGNOSIS — Z95828 Presence of other vascular implants and grafts: Secondary | ICD-10-CM | POA: Diagnosis not present

## 2024-01-12 DIAGNOSIS — I728 Aneurysm of other specified arteries: Secondary | ICD-10-CM | POA: Insufficient documentation

## 2024-01-12 DIAGNOSIS — I7143 Infrarenal abdominal aortic aneurysm, without rupture: Secondary | ICD-10-CM | POA: Diagnosis not present

## 2024-01-14 ENCOUNTER — Other Ambulatory Visit: Payer: Self-pay | Admitting: Internal Medicine

## 2024-01-18 NOTE — Telephone Encounter (Signed)
 This can also wait for provider to come back.

## 2024-01-20 ENCOUNTER — Telehealth: Payer: Self-pay

## 2024-01-20 NOTE — Telephone Encounter (Signed)
 Placed inside office box, attached with medication list please sign both.

## 2024-01-20 NOTE — Telephone Encounter (Signed)
 Copied from CRM [id number]. Topic: General - Other >> Jan 19, 2024  4:36 PM Star East wrote: Reason for CRM: Ilda Malkin with [facility]. [employer], sent SL2 form over twice by fax and have not received response- please call [phone]

## 2024-01-26 ENCOUNTER — Ambulatory Visit: Payer: Self-pay | Admitting: *Deleted

## 2024-01-26 ENCOUNTER — Ambulatory Visit: Admitting: Internal Medicine

## 2024-01-26 NOTE — Telephone Encounter (Signed)
  Chief Complaint: bilateral leg swelling , feet numbness. Had to cancel appt today due to transportation issues afraid to drive with numbness in feet.  Symptoms: bilateral leg swelling up to knees. Pitting edema reported. No redness. Bilateral feet numb.  Frequency: 4-5 days  Pertinent Negatives: Patient denies chest pain no difficulty breathing no fever no redness in legs no weeping fluid no pain  Disposition: [] ED /[] Urgent Care (no appt availability in office) / [x] Appointment(In office/virtual)/ []  [facility]/ [] Home Care/ [] Refused Recommended Disposition /[] [facility]/ []  Follow-up with PCP Additional Notes:   Patient requested to cancel appt today due to no one to drive her to appt due to numbness in feet. Appt already canceled and NT scheduled appt for tomorrow. Patient reports she can not schedule appt until afternoon due to transportation. Scheduled with other provider in office.  Recommended if sx worsen go to ED.     Copied from CRM [id number]. Topic: Clinical - Red Word Triage >> Jan 26, 2024  9:14 AM [place] A wrote: [facility] that prompted transfer to Nurse Triage: Pt has swelling in her feet up to half of her legs- patient has no feeling in feel they are numb. Has been like this for 4 or 5 days Reason for Disposition  [1] MODERATE leg swelling (e.g., swelling extends up to knees) AND [2] new-onset or worsening  Answer Assessment - Initial Assessment Questions 1. ONSET: "When did the swelling start?" (e.g., minutes, hours, days)     4-5 days  2. LOCATION: "What part of the leg is swollen?"  "Are both legs swollen or just one leg?"     Bilateral legs  3. SEVERITY: "How bad is the swelling?" (e.g., localized; mild, moderate, severe)   - Localized: Small area of swelling localized to one leg.   - MILD pedal edema: Swelling limited to foot and ankle, pitting edema < 1/4 inch (6 mm) deep, rest and elevation eliminate most or all swelling.   - MODERATE edema:  Swelling of lower leg to knee, pitting edema > 1/4 inch (6 mm) deep, rest and elevation only partially reduce swelling.   - SEVERE edema: Swelling extends above knee, facial or hand swelling present.      Moderate  4. REDNESS: "Does the swelling look red or infected?"     no 5. PAIN: "Is the swelling painful to touch?" If Yes, ask: "How painful is it?"   (Scale 1-10; mild, moderate or severe)     Numbness on feet  6. FEVER: "Do you have a fever?" If Yes, ask: "What is it, how was it measured, and when did it start?"      na 7. CAUSE: "What do you think is causing the leg swelling?"     Not sure  8. MEDICAL HISTORY: "Do you have a history of blood clots (e.g., DVT), cancer, heart failure, kidney disease, or liver failure?"     Aortic aneurysm repair 9. RECURRENT SYMPTOM: "Have you had leg swelling before?" If Yes, ask: "When was the last time?" "What happened that time?"     No  10. OTHER SYMPTOMS: "Do you have any other symptoms?" (e.g., chest pain, difficulty breathing)       Bilateral leg swelling  11. PREGNANCY: "Is there any chance you are pregnant?" "When was your last menstrual period?"       na  Protocols used: Leg Swelling and Edema-A-AH

## 2024-01-27 ENCOUNTER — Ambulatory Visit (INDEPENDENT_AMBULATORY_CARE_PROVIDER_SITE_OTHER): Admitting: Nurse Practitioner

## 2024-01-27 VITALS — HR 71 | Temp 98.2°F | Ht 66.0 in | Wt 114.0 lb

## 2024-01-27 DIAGNOSIS — R011 Cardiac murmur, unspecified: Secondary | ICD-10-CM | POA: Diagnosis not present

## 2024-01-27 DIAGNOSIS — M7989 Other specified soft tissue disorders: Secondary | ICD-10-CM | POA: Insufficient documentation

## 2024-01-27 MED ORDER — FUROSEMIDE 20 MG PO TABS: 20.0000 mg | ORAL_TABLET | Freq: Every day | ORAL | 3 refills | Status: AC | PRN

## 2024-01-27 NOTE — Assessment & Plan Note (Addendum)
 Chronic Has history of mitral valve regurgitation.  Now new onset bilateral lower extremity swelling. No recent history of surgery or prolonged bedrest, she is not on hormone replacement therapy, no excessive redness or pain to lower extremities. Check cardiac echocardiogram. Start patient on furosemide 20 mg a mouth daily as needed.  Patient told to take 20 mg daily x 3 days. Return to clinic in 1 week for labs including CMP, BNP.

## 2024-01-27 NOTE — Progress Notes (Signed)
 Established Patient Office Visit  Subjective   Patient ID: [name], female    DOB: 1946/04/10  Age: 78 y.o. MRN: [id number]  Chief Complaint  Patient presents with   Leg Swelling    [name] Is a 78 year old female who presents today for acute visit for bilateral leg swelling x 2 weeks.  Past medical history is significant for arthritis, GERD, hyperlipidemia, COPD, AAA status post repair (follows with vascular surgery), IBS, mild mitral valve regurgitation, and varicose veins.  She presents today for 2-week history of bilateral lower extremity swelling.  No pain, no fever, no orthopnea, no shortness of breath, no chest pain, no cardiac palpitations.  No recent falls or traumatic events.    Review of Systems  Constitutional:  Negative for chills, fever and malaise/fatigue.  Respiratory:  Negative for cough and shortness of breath.   Cardiovascular:  Positive for leg swelling. Negative for chest pain, palpitations and orthopnea.  Neurological:  Negative for dizziness and headaches.      Objective:     Pulse 71   Temp 98.2 F (36.8 C) (Temporal)   Ht 5\' 6"  (1.676 m)   Wt 114 lb (51.7 kg)   SpO2 94%   BMI 18.40 kg/m    Physical Exam Vitals reviewed.  Constitutional:      General: She is not in acute distress.    Appearance: Normal appearance.  HENT:     Head: Normocephalic and atraumatic.  Neck:     Vascular: No carotid bruit.  Cardiovascular:     Rate and Rhythm: Normal rate and regular rhythm.     Pulses: Normal pulses.     Heart sounds: Murmur heard.  Pulmonary:     Effort: Pulmonary effort is normal.     Breath sounds: Normal breath sounds.  Musculoskeletal:     Right lower leg: 1+ Edema present.     Left lower leg: 1+ Edema present.  Skin:    General: Skin is warm and dry.  Neurological:     General: No focal deficit present.     Mental Status: She is alert and oriented to person, place, and time.  Psychiatric:        Mood and Affect: Mood  normal.        Behavior: Behavior normal.        Judgment: Judgment normal.      No results found for any visits on 01/27/24.    The 10-year ASCVD risk score (Arnett DK, et al., 2019) is: 34.2%    Assessment & Plan:   Problem List Items Addressed This Visit       Other   Murmur - Primary   Chronic Has history of mitral valve regurgitation.  Now new onset bilateral lower extremity swelling. Check cardiac echocardiogram. Start patient on furosemide 20 mg a mouth daily as needed.  Patient told to take 20 mg daily x 3 days. Return to clinic in 1 week for labs including CMP, BNP.      Relevant Orders   ECHOCARDIOGRAM COMPLETE   Leg swelling   Chronic Has history of mitral valve regurgitation.  Now new onset bilateral lower extremity swelling. No recent history of surgery or prolonged bedrest, she is not on hormone replacement therapy, no excessive redness or pain to lower extremities. Check cardiac echocardiogram. Start patient on furosemide 20 mg a mouth daily as needed.  Patient told to take 20 mg daily x 3 days. Return to clinic in 1 week for labs including  CMP, BNP.      Relevant Medications   furosemide (LASIX) 20 MG tablet   Other Relevant Orders   B Nat Peptide   Comprehensive metabolic panel with GFR   Microalbumin / creatinine urine ratio    Return in about 1 month (around 02/26/2024) for with PCP.    Zorita Hiss, NP

## 2024-01-27 NOTE — Assessment & Plan Note (Addendum)
 Chronic Has history of mitral valve regurgitation.  Now new onset bilateral lower extremity swelling. Check cardiac echocardiogram. Start patient on furosemide 20 mg a mouth daily as needed.  Patient told to take 20 mg daily x 3 days. Return to clinic in 1 week for labs including CMP, BNP.

## 2024-02-02 ENCOUNTER — Other Ambulatory Visit: Payer: Self-pay | Admitting: Internal Medicine

## 2024-02-02 DIAGNOSIS — E782 Mixed hyperlipidemia: Secondary | ICD-10-CM

## 2024-02-05 ENCOUNTER — Other Ambulatory Visit

## 2024-02-08 ENCOUNTER — Other Ambulatory Visit (INDEPENDENT_AMBULATORY_CARE_PROVIDER_SITE_OTHER)

## 2024-02-08 DIAGNOSIS — M7989 Other specified soft tissue disorders: Secondary | ICD-10-CM

## 2024-02-08 LAB — MICROALBUMIN / CREATININE URINE RATIO
Creatinine,U: 36.7 mg/dL
Microalb Creat Ratio: 24.4 mg/g (ref 0.0–30.0)
Microalb, Ur: 0.9 mg/dL (ref 0.0–1.9)

## 2024-02-08 LAB — COMPREHENSIVE METABOLIC PANEL WITH GFR
ALT: 13 U/L (ref 0–35)
AST: 23 U/L (ref 0–37)
Albumin: 4.6 g/dL (ref 3.5–5.2)
Alkaline Phosphatase: 65 U/L (ref 39–117)
BUN: 20 mg/dL (ref 6–23)
CO2: 32 meq/L (ref 19–32)
Calcium: 9.7 mg/dL (ref 8.4–10.5)
Chloride: 97 meq/L (ref 96–112)
Creatinine, Ser: 0.81 mg/dL (ref 0.40–1.20)
GFR: 68.97 mL/min (ref >60.00)
Glucose, Bld: 116 mg/dL — ABNORMAL HIGH (ref 70–99)
Potassium: 4 meq/L (ref 3.5–5.1)
Sodium: 137 meq/L (ref 135–145)
Total Bilirubin: 0.5 mg/dL (ref 0.2–1.2)
Total Protein: 7.5 g/dL (ref 6.0–8.3)

## 2024-02-08 LAB — BRAIN NATRIURETIC PEPTIDE: Pro B Natriuretic peptide (BNP): 45 pg/mL (ref 0.0–100.0)

## 2024-02-09 ENCOUNTER — Ambulatory Visit: Payer: Self-pay | Admitting: Nurse Practitioner

## 2024-02-16 DIAGNOSIS — M47817 Spondylosis without myelopathy or radiculopathy, lumbosacral region: Secondary | ICD-10-CM | POA: Diagnosis not present

## 2024-02-23 ENCOUNTER — Other Ambulatory Visit (HOSPITAL_BASED_OUTPATIENT_CLINIC_OR_DEPARTMENT_OTHER)

## 2024-03-11 ENCOUNTER — Other Ambulatory Visit: Payer: Self-pay | Admitting: Internal Medicine

## 2024-03-16 NOTE — Telephone Encounter (Signed)
 Copied from CRM [id number]. Topic: Clinical - Medication Refill >> Mar 16, 2024  9:00 AM Emmet Harm C wrote: Medication: diazepam  (VALIUM ) 5 MG tablet  Has the patient contacted their pharmacy? Yes (Agent: If no, request that the patient contact the pharmacy for the refill. If patient does not wish to contact the pharmacy document the reason why and proceed with request.) (Agent: If yes, when and what did the pharmacy advise?)  This is the patient's preferred pharmacy:  [facility] [place], [place] - [address] [place] [address] Phone: [phone] Fax: [phone]  Is this the correct pharmacy for this prescription? Yes If no, delete pharmacy and type the correct one.   Has the prescription been filled recently? No  Is the patient out of the medication? Yes  Has the patient been seen for an appointment in the last year OR does the patient have an upcoming appointment? Yes  Can we respond through MyChart? No  Agent: Please be advised that Rx refills may take up to 3 business days. We ask that you follow-up with your pharmacy.

## 2024-03-21 ENCOUNTER — Ambulatory Visit (HOSPITAL_BASED_OUTPATIENT_CLINIC_OR_DEPARTMENT_OTHER)

## 2024-03-21 DIAGNOSIS — R011 Cardiac murmur, unspecified: Secondary | ICD-10-CM

## 2024-03-21 LAB — ECHOCARDIOGRAM COMPLETE
AR max vel: 1.3 cm2
AV Area VTI: 1.32 cm2
AV Area mean vel: 1.24 cm2
AV Mean grad: 10 mmHg
AV Peak grad: 18.3 mmHg
Ao pk vel: 2.14 m/s
Area-P 1/2: 5.02 cm2
MV M vel: 3.96 m/s
MV Peak grad: 62.7 mmHg
S' Lateral: 2.09 cm

## 2024-03-22 ENCOUNTER — Other Ambulatory Visit (HOSPITAL_BASED_OUTPATIENT_CLINIC_OR_DEPARTMENT_OTHER)

## 2024-03-23 ENCOUNTER — Ambulatory Visit: Admitting: Internal Medicine

## 2024-03-24 DIAGNOSIS — M47817 Spondylosis without myelopathy or radiculopathy, lumbosacral region: Secondary | ICD-10-CM | POA: Diagnosis not present

## 2024-03-25 ENCOUNTER — Ambulatory Visit: Admitting: Internal Medicine

## 2024-04-07 ENCOUNTER — Telehealth: Payer: Self-pay | Admitting: Internal Medicine
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
## 2024-04-07 NOTE — Telephone Encounter (Signed)
 I was able to reschedule patient and also informed her to call her cardiologist to get her results for the echo

## 2024-04-08 ENCOUNTER — Ambulatory Visit: Admitting: Internal Medicine

## 2024-04-13 ENCOUNTER — Other Ambulatory Visit: Payer: Self-pay | Admitting: Nurse Practitioner

## 2024-04-13 DIAGNOSIS — J302 Other seasonal allergic rhinitis: Secondary | ICD-10-CM

## 2024-04-14 ENCOUNTER — Other Ambulatory Visit: Payer: Self-pay | Admitting: Internal Medicine

## 2024-04-14 DIAGNOSIS — J302 Other seasonal allergic rhinitis: Secondary | ICD-10-CM

## 2024-04-26 ENCOUNTER — Encounter: Payer: Self-pay | Admitting: Internal Medicine

## 2024-04-26 ENCOUNTER — Ambulatory Visit (INDEPENDENT_AMBULATORY_CARE_PROVIDER_SITE_OTHER): Admitting: Internal Medicine

## 2024-04-26 VITALS — BP 132/88 | HR 105 | Temp 98.9°F | Ht 66.0 in | Wt 120.0 lb

## 2024-04-26 DIAGNOSIS — I10 Essential (primary) hypertension: Secondary | ICD-10-CM

## 2024-04-26 DIAGNOSIS — Z0001 Encounter for general adult medical examination with abnormal findings: Secondary | ICD-10-CM

## 2024-04-26 DIAGNOSIS — I700 Atherosclerosis of aorta: Secondary | ICD-10-CM | POA: Diagnosis not present

## 2024-04-26 DIAGNOSIS — Z0000 Encounter for general adult medical examination without abnormal findings: Secondary | ICD-10-CM

## 2024-04-26 DIAGNOSIS — Z720 Tobacco use: Secondary | ICD-10-CM

## 2024-04-26 DIAGNOSIS — E782 Mixed hyperlipidemia: Secondary | ICD-10-CM | POA: Diagnosis not present

## 2024-04-26 DIAGNOSIS — K591 Functional diarrhea: Secondary | ICD-10-CM

## 2024-04-26 DIAGNOSIS — J302 Other seasonal allergic rhinitis: Secondary | ICD-10-CM

## 2024-04-26 DIAGNOSIS — I7143 Infrarenal abdominal aortic aneurysm, without rupture: Secondary | ICD-10-CM | POA: Diagnosis not present

## 2024-04-26 DIAGNOSIS — F1120 Opioid dependence, uncomplicated: Secondary | ICD-10-CM

## 2024-04-26 MED ORDER — DIPHENOXYLATE-ATROPINE 2.5-0.025 MG PO TABS: 1.0000 | ORAL_TABLET | Freq: Two times a day (BID) | ORAL | 0 refills | Status: AC | PRN

## 2024-04-26 MED ORDER — CYCLOBENZAPRINE HCL 5 MG PO TABS: 5.0000 mg | ORAL_TABLET | Freq: Every day | ORAL | 3 refills | Status: AC

## 2024-04-26 MED ORDER — MONTELUKAST SODIUM 10 MG PO TABS: 10.0000 mg | ORAL_TABLET | Freq: Every day | ORAL | 1 refills | Status: AC

## 2024-04-26 MED ORDER — ATORVASTATIN CALCIUM 20 MG PO TABS
20.0000 mg | ORAL_TABLET | Freq: Every day | ORAL | 0 refills | Status: DC
Start: 2024-04-26 — End: 2024-08-09

## 2024-04-26 MED ORDER — AMLODIPINE BESYLATE 10 MG PO TABS: 10.0000 mg | ORAL_TABLET | Freq: Every day | ORAL | 3 refills | Status: AC

## 2024-04-26 NOTE — Assessment & Plan Note (Signed)
 Counseled to quit and no desire to quit today.

## 2024-04-26 NOTE — Assessment & Plan Note (Signed)
 Flu shot yearly. Pneumonia complete. Shingrix  complete. Tetanus up to date. Colonoscopy aged out future. Mammogram aged out, pap smear aged out and dexa up to date. Counseled about sun safety and mole surveillance. Counseled about the dangers of distracted driving. Given 10 year screening recommendations.

## 2024-04-26 NOTE — Assessment & Plan Note (Signed)
 Recent lipid panel and continue lipitor.

## 2024-04-26 NOTE — Assessment & Plan Note (Signed)
 Overall stable and mildly improving. Using lomotil  prn and refilled today.

## 2024-04-26 NOTE — Assessment & Plan Note (Signed)
 BP at goal on amlodipine  and labs normal recently. Continue.

## 2024-04-26 NOTE — Assessment & Plan Note (Signed)
 Recent labs stable will continue lipitor refilled.

## 2024-04-26 NOTE — Assessment & Plan Note (Signed)
 Following with vascular and s/p grafting without signs of complication.

## 2024-04-26 NOTE — Progress Notes (Signed)
   Subjective:   Patient ID: [name], female    DOB: 1946-08-23, 78 y.o.   MRN: [id number]  HPI The patient is here for physical.  PMH, [facility], social history reviewed and updated  Review of Systems  Constitutional:  Positive for appetite change.  HENT: Negative.    Eyes: Negative.   Respiratory:  Negative for cough, chest tightness and shortness of breath.   Cardiovascular:  Negative for chest pain, palpitations and leg swelling.  Gastrointestinal:  Negative for abdominal distention, abdominal pain, constipation, diarrhea, nausea and vomiting.  Musculoskeletal: Negative.   Skin: Negative.   Neurological: Negative.   Psychiatric/Behavioral: Negative.      Objective:  Physical Exam Constitutional:      Appearance: She is well-developed.  HENT:     Head: Normocephalic and atraumatic.  Cardiovascular:     Rate and Rhythm: Normal rate and regular rhythm.  Pulmonary:     Effort: Pulmonary effort is normal. No respiratory distress.     Breath sounds: Normal breath sounds. No wheezing or rales.  Abdominal:     General: Bowel sounds are normal. There is no distension.     Palpations: Abdomen is soft.     Tenderness: There is no abdominal tenderness. There is no rebound.  Musculoskeletal:     Cervical back: Normal range of motion.  Skin:    General: Skin is warm and dry.  Neurological:     Mental Status: She is alert and oriented to person, place, and time.     Coordination: Coordination normal.     Vitals:   04/26/24 1527  BP: 132/88  Pulse: (!) 105  Temp: 98.9 F (37.2 C)  TempSrc: Oral  SpO2: 91%  Weight: 120 lb (54.4 kg)  Height: 5' 6 (1.676 m)    Assessment & Plan:

## 2024-04-26 NOTE — Assessment & Plan Note (Signed)
 Taking hydrocodone  stable and monitoring through pain management.

## 2024-05-05 DIAGNOSIS — M47817 Spondylosis without myelopathy or radiculopathy, lumbosacral region: Secondary | ICD-10-CM | POA: Diagnosis not present

## 2024-06-03 ENCOUNTER — Ambulatory Visit

## 2024-06-16 ENCOUNTER — Other Ambulatory Visit: Payer: Self-pay

## 2024-06-16 DIAGNOSIS — I7143 Infrarenal abdominal aortic aneurysm, without rupture: Secondary | ICD-10-CM

## 2024-07-05 ENCOUNTER — Ambulatory Visit: Payer: Self-pay

## 2024-07-05 NOTE — Telephone Encounter (Signed)
 Pt has been scheduled.

## 2024-07-05 NOTE — Telephone Encounter (Signed)
 FYI Only or Action Required?: Action required by provider: request for appointment and clinical question for provider.  Patient was last seen in primary care on 04/26/2024 by Rollene Almarie LABOR, MD.  Called Nurse Triage reporting Pain.  Symptoms began a week ago.  Interventions attempted: Prescription medications: Hydrocodone .  Symptoms are: unchanged.  Triage Disposition: See HCP Within 4 Hours (Or PCP Triage)  Patient/caregiver understands and will follow disposition?: No, wishes to speak with PCP  Reason for Disposition  [1] SEVERE back pain (e.g., excruciating, unable to do any normal activities) AND [2] not improved 2 hours after pain medicine  Answer Assessment - Initial Assessment Questions No available appts today with PCP, offered alternative provider. Pt declined appt today and Requesting appt next week, just not on 07/14/24 and call back.  Advised UC/ED if symptoms worsen.  1. ONSET: When did the pain begin? (e.g., minutes, hours, days)     Week ago;pt fell when taking trash out 2. LOCATION: Where does it hurt? (upper, mid or lower back)     Right hip, lower back, little swelling, bruising, denies cuts,  3. SEVERITY: How bad is the pain?  (e.g., Scale 1-10; mild, moderate, or severe)     8/10; hydrocodone ; not really helping with pain 4. PATTERN: Is the pain constant? (e.g., yes, no; constant, intermittent)      constant 5. RADIATION: Does the pain shoot into your legs or somewhere else?     Entire right leg  6. CAUSE:  What do you think is causing the back pain?      Clemens last Wednesday, when trying to push large trash can with sister 7. BACK OVERUSE:  Any recent lifting of heavy objects, strenuous work or exercise?     fell 8. MEDICINES: What have you taken so far for the pain? (e.g., nothing, acetaminophen , NSAIDS)     hydrocodone  9. NEUROLOGIC SYMPTOMS: Do you have any weakness, numbness, or problems with bowel/bladder control?      denies 10. OTHER SYMPTOMS: Do you have any other symptoms? (e.g., fever, abdomen pain, burning with urination, blood in urine)       denies Denies uncousiness, head injury; it was just a hard fall, previous back surgyer with hard ware.  Protocols used: Back Pain-A-AH

## 2024-07-19 ENCOUNTER — Ambulatory Visit: Admitting: Vascular Surgery

## 2024-07-19 ENCOUNTER — Ambulatory Visit

## 2024-07-20 ENCOUNTER — Ambulatory Visit: Admitting: Internal Medicine

## 2024-07-21 ENCOUNTER — Other Ambulatory Visit: Payer: Self-pay | Admitting: Family

## 2024-07-25 ENCOUNTER — Ambulatory Visit (INDEPENDENT_AMBULATORY_CARE_PROVIDER_SITE_OTHER)

## 2024-07-25 VITALS — Ht 66.0 in | Wt 120.0 lb

## 2024-07-25 DIAGNOSIS — Z0000 Encounter for general adult medical examination without abnormal findings: Secondary | ICD-10-CM

## 2024-07-25 DIAGNOSIS — Z122 Encounter for screening for malignant neoplasm of respiratory organs: Secondary | ICD-10-CM

## 2024-07-25 DIAGNOSIS — F17200 Nicotine dependence, unspecified, uncomplicated: Secondary | ICD-10-CM | POA: Diagnosis not present

## 2024-07-25 NOTE — Progress Notes (Signed)
 Subjective:   [name] is a 78 y.o. who presents for a Medicare Wellness preventive visit.  As a reminder, Annual Wellness Visits don't include a physical exam, and some assessments may be limited, especially if this visit is performed virtually. We may recommend an in-person follow-up visit with your provider if needed.  Visit Complete: Virtual I connected with  [name] on 07/25/24 by a audio enabled telemedicine application and verified that I am speaking with the correct person using two identifiers.  Patient Location: Home  Provider Location: Office/Clinic  I discussed the limitations of evaluation and management by telemedicine. The patient expressed understanding and agreed to proceed.  Vital Signs: Because this visit was a virtual/telehealth visit, some criteria may be missing or patient reported. Any vitals not documented were not able to be obtained and vitals that have been documented are patient reported.  VideoDeclined- This patient declined Librarian, academic. Therefore the visit was completed with audio only.  Persons Participating in Visit: Patient.  AWV Questionnaire: No: Patient Medicare AWV questionnaire was not completed prior to this visit.  Cardiac Risk Factors include: advanced age (>82men, >37 women);dyslipidemia;hypertension;smoking/ tobacco exposure     Objective:    Today's Vitals   07/25/24 1136  Weight: 120 lb (54.4 kg)  Height: 5' 6 (1.676 m)   Body mass index is 19.37 kg/m.     07/25/2024   11:46 AM 06/24/2023    3:49 PM 04/14/2023   11:23 AM 06/23/2022   11:28 AM 01/15/2022   12:12 PM 06/19/2021    5:25 PM 03/13/2020    8:23 AM  Advanced Directives  Does Patient Have a Medical Advance Directive? No Yes Yes Yes Yes Yes Yes  Type of Science Writer of [place];Living will Healthcare Power of [place];Living will Healthcare Power of [place];Living will  Healthcare Power of [place];Living will;Out of facility DNR (pink MOST or yellow form) Healthcare Power of [place];Living will  Does patient want to make changes to medical advance directive?       No - Patient declined  Copy of Healthcare Power of Attorney in Chart?  Yes - validated most recent copy scanned in chart (See row information) No - copy requested No - copy requested  No - copy requested   Would patient like information on creating a medical advance directive? No - Patient declined          Current Medications (verified) Outpatient Encounter Medications as of 07/25/2024  Medication Sig   amLODipine  (NORVASC ) 10 MG tablet Take 1 tablet (10 mg total) by mouth daily.   atorvastatin  (LIPITOR) 20 MG tablet Take 1 tablet (20 mg total) by mouth daily.   Cobalamin Combinations (NEURIVA PLUS PO) Take 1 capsule by mouth daily.   cyclobenzaprine  (FLEXERIL ) 5 MG tablet Take 1 tablet (5 mg total) by mouth at bedtime.   diazepam  (VALIUM ) 5 MG tablet TAKE ONE TABLET ONCE DAILY AS NEEDED FOR ANXIETY   diphenoxylate -atropine  (LOMOTIL ) 2.5-0.025 MG tablet Take 1 tablet by mouth 2 (two) times daily as needed for diarrhea or loose stools.   furosemide  (LASIX ) 20 MG tablet Take 1 tablet (20 mg total) by mouth daily as needed.   HYDROcodone -acetaminophen  (NORCO) 10-325 MG tablet Take 1 tablet by mouth every 6 (six) hours as needed.   montelukast  (SINGULAIR ) 10 MG tablet Take 1 tablet (10 mg total) by mouth at bedtime.   promethazine  (PHENERGAN ) 12.5 MG tablet TAKE ONE TABLET EVERY 8  HOURS AS NEEDED NAUSEA/VOMITING   vitamin B-12 (CYANOCOBALAMIN ) 1000 MCG tablet Take 1 tablet (1,000 mcg total) by mouth daily.   No facility-administered encounter medications on file as of 07/25/2024.    Allergies (verified) Bee venom, Cephalexin, and Gabapentin   History: Past Medical History:  Diagnosis Date   AAA (abdominal aortic aneurysm)    saw Dr Gerlean 11/2013.  2.7 cm   Arthritis    Chicken pox    COPD  (chronic obstructive pulmonary disease) (HCC)    smokes 1/2ppd   DDD (degenerative disc disease), lumbar    GERD (gastroesophageal reflux disease)    diet controlled   Headache    Hx of migraines   History of blood transfusion    History of lumbar fusion 08/09/2014   Hyperlipidemia    IBS (irritable bowel syndrome)    diet contolled   Mild mitral regurgitation    Peripheral vascular disease    AAA   RBBB    S/P arthroscopy of right shoulder 12/15/2017   Seasonal allergies    Varicose veins    Past Surgical History:  Procedure Laterality Date   ABDOMINAL AORTIC ENDOVASCULAR STENT GRAFT Bilateral 04/20/2023   Procedure: ABDOMINAL AORTIC ENDOVASCULAR STENT GRAFT;  Surgeon: Gretta Lonni PARAS, MD;  Location: [facility] OR;  Service: Vascular;  Laterality: Bilateral;   APPENDECTOMY     BACK SURGERY N/A [phone]   5 back surgeries   BREAST BIOPSY  1989,05   lt br bx   BREAST EXCISIONAL BIOPSY Bilateral    CATARACT EXTRACTION W/ INTRAOCULAR LENS IMPLANT Bilateral 2018   CHOLECYSTECTOMY     COLONOSCOPY     EXCISION/RELEASE BURSA HIP Right 05/12/2013   Procedure: EXCISION RIGHT TROCHANTERIC BURSA/CALCIFICATION ;  Surgeon: Toribio JULIANNA Chancy, MD;  Location: [facility];  Service: Orthopedics;  Laterality: Right;   FASCIOTOMY Right 05/12/2013   Procedure: RIGHT FASCIOTOMY HIP ANY TYPE;  Surgeon: Toribio JULIANNA Chancy, MD;  Location: [facility];  Service: Orthopedics;  Laterality: Right;   LARYNGOSCOPY AND BRONCHOSCOPY N/A 08/15/2019   Procedure: BRONCHOSCOPY/LARYNGOSCOPY;  Surgeon: Karis Clunes, MD;  Location: [facility];  Service: ENT;  Laterality: N/A;   NM MYOCAR PERF WALL MOTION  04/13/2003   negative   SHOULDER ARTHROSCOPY WITH OPEN ROTATOR CUFF REPAIR Right 2018   skin cancer removed right leg     TONSILLECTOMY AND ADENOIDECTOMY  1950   TOTAL HIP ARTHROPLASTY Left 2006   lt total hip   TOTAL HIP ARTHROPLASTY Right 2011   TOTAL HIP ARTHROPLASTY  Right 2016   second surgery due to recall   ULTRASOUND GUIDANCE FOR VASCULAR ACCESS Bilateral 04/20/2023   Procedure: ULTRASOUND GUIDANCE FOR VASCULAR ACCESS;  Surgeon: Gretta Lonni PARAS, MD;  Location: [facility] OR;  Service: Vascular;  Laterality: Bilateral;   US  ECHOCARDIOGRAPHY  04/13/2003   EF 65%, mild TR w/suggestion of mild pulmonary hypertension,thickened anterior leaflet,central mild MR.   VAGINAL HYSTERECTOMY  1993   Family History  Problem Relation Age of Onset   Early death Mother        aortic aneurysm   Vasculitis Mother        temporal arteritis   Aortic aneurysm Mother    Heart disease Mother        before age 59   Hyperlipidemia Mother    Hypertension Mother    COPD Sister    Hypertension Sister    Depression Sister    Heart disease Sister  before age 47   Post-traumatic stress disorder Sister    Bipolar disorder Sister    Brain cancer Maternal Aunt 61       brain   Heart disease Maternal Grandmother        aortic aneurysm   Aortic aneurysm Maternal Grandmother    Hypertension Sister    Throat cancer Maternal Grandfather    Breast cancer Neg Hx    Social History   Socioeconomic History   Marital status: Legally Separated    Spouse name: Not on file   Number of children: Not on file   Years of education: Not on file   Highest education level: Not on file  Occupational History   Not on file  Tobacco Use   Smoking status: Every Day    Current packs/day: 0.75    Average packs/day: 0.8 packs/day for 30.0 years (22.5 ttl pk-yrs)    Types: Cigarettes   Smokeless tobacco: Never   Tobacco comments:    Cut way back about 4-5 cigs/day  Vaping Use   Vaping status: Never Used  Substance and Sexual Activity   Alcohol use: Not Currently    Alcohol/week: 0.0 standard drinks of alcohol    Comment: very occasional, not monthly   Drug use: No   Sexual activity: Not Currently  Other Topics Concern   Not on file  Social History Narrative   Not on file    Social Drivers of Health   Financial Resource Strain: Low Risk  (07/25/2024)   Overall Financial Resource Strain (CARDIA)    Difficulty of Paying Living Expenses: Not hard at all  Food Insecurity: No Food Insecurity (07/25/2024)   Hunger Vital Sign    Worried About Running Out of Food in the Last Year: Never true    Ran Out of Food in the Last Year: Never true  Transportation Needs: No Transportation Needs (07/25/2024)   PRAPARE - Administrator, Civil Service (Medical): No    Lack of Transportation (Non-Medical): No  Physical Activity: Inactive (07/25/2024)   Exercise Vital Sign    Days of Exercise per Week: 0 days    Minutes of Exercise per Session: 0 min  Stress: No Stress Concern Present (07/25/2024)   [employer] of Occupational Health - Occupational Stress Questionnaire    Feeling of Stress: Not at all  Social Connections: Socially Isolated (07/25/2024)   Social Connection and Isolation Panel    Frequency of Communication with Friends and Family: Three times a week    Frequency of Social Gatherings with Friends and Family: Once a week    Attends Religious Services: Never    Database Administrator or Organizations: No    Attends Engineer, Structural: Never    Marital Status: Separated    Tobacco Counseling Ready to quit: No Counseling given: Yes Tobacco comments: Cut way back about 4-5 cigs/day    Clinical Intake:  Pre-visit preparation completed: Yes  Pain : No/denies pain     BMI - recorded: 19.37 Nutritional Status: BMI of 19-24  Normal Nutritional Risks: None Diabetes: No  No results found for: HGBA1C   How often do you need to have someone help you when you read instructions, pamphlets, or other written materials from your doctor or pharmacy?: 1 - Never  Interpreter Needed?: No  Information entered by :: [name], CMA   Activities of Daily Living     07/25/2024   11:38 AM  In your present state of health,  do you have any difficulty performing the following activities:  Hearing? 0  Vision? 0  Difficulty concentrating or making decisions? 0  Walking or climbing stairs? 0  Dressing or bathing? 0  Doing errands, shopping? 0  Preparing Food and eating ? N  Using the Toilet? N  In the past six months, have you accidently leaked urine? N  Do you have problems with loss of bowel control? N  Managing your Medications? N  Managing your Finances? N  Housekeeping or managing your Housekeeping? N    Patient Care Team: Rollene Almarie LABOR, MD as PCP - General (Internal Medicine) Leeann Hover, MD as Consulting Physician (Neurosurgery) Mindi Mt, MD (Inactive) as Consulting Physician (Pain Medicine) Luis Purchase, MD as Consulting Physician (Gastroenterology) [facility] (Vascular Surgery)  I have updated your Care Teams any recent Medical Services you may have received from other providers in the past year.     Assessment:   This is a routine wellness examination for [name].  Hearing/Vision screen Hearing Screening - Comments:: Denies hearing difficulties   Vision Screening - Comments:: Denies vision concerns   Goals Addressed               This Visit's Progress     Patient Stated (pt-stated)        Patient stated she plans to keep taking meds daily       Depression Screen     07/25/2024   11:39 AM 04/26/2024    3:26 PM 11/16/2023    3:13 PM 10/28/2023   11:22 AM 06/24/2023    3:52 PM 03/23/2023    1:05 PM 02/10/2023    2:11 PM  PHQ 2/9 Scores  PHQ - 2 Score 0 0 0 0 0 0 0  PHQ- 9 Score 0  0 0 5 0 0    Fall Risk     07/25/2024   11:38 AM 04/26/2024    3:27 PM 12/29/2023    3:12 PM 11/16/2023    3:19 PM 07/17/2023    1:55 PM  Fall Risk   Falls in the past year? 0 0 0 0 0  Number falls in past yr: 0 0 0 0 0  Injury with Fall? 0 0 0 0 0  Risk for fall due to : No Fall Risks   No Fall Risks   Follow up Falls evaluation completed;Falls prevention  discussed Falls evaluation completed Falls evaluation completed Falls evaluation completed Falls evaluation completed    MEDICARE RISK AT HOME:  Medicare Risk at Home Any stairs in or around the home?: Yes (outside) If so, are there any without handrails?: No Home free of loose throw rugs in walkways, pet beds, electrical cords, etc?: Yes Adequate lighting in your home to reduce risk of falls?: Yes Life alert?: No Use of a cane, walker or w/c?: No Grab bars in the bathroom?: Yes Shower chair or bench in shower?: Yes Elevated toilet seat or a handicapped toilet?: Yes  TIMED UP AND GO:  Was the test performed?  No  Cognitive Function: 6CIT completed        07/25/2024   11:41 AM 06/24/2023    3:53 PM 06/23/2022   11:29 AM 06/19/2021    5:23 PM 03/13/2020    8:25 AM  6CIT Screen  What Year? 0 points 0 points 0 points 0 points 0 points  What month? 0 points 0 points 0 points 0 points 0 points  What time? 0 points 0 points 0 points  0 points 0 points  Count back from 20 0 points 0 points 0 points 0 points 0 points  Months in reverse 0 points 0 points 0 points 0 points 0 points  Repeat phrase 2 points 0 points 2 points 8 points 0 points  Total Score 2 points 0 points 2 points 8 points 0 points    Immunizations Immunization History  Administered Date(s) Administered   Fluad Quad(high Dose 65+) 07/27/2017, 06/29/2018, 06/11/2019, 06/24/2022   INFLUENZA, HIGH DOSE SEASONAL PF 07/27/2017, 06/29/2018, 06/18/2021   Influenza, Seasonal, Injecte, Preservative Fre 07/04/2014, 08/09/2015   Influenza-Unspecified 07/04/2014, 08/09/2015, 07/27/2017, 06/29/2018, 06/13/2019, 07/16/2020, 07/09/2023   Moderna Covid-19 Vaccine Bivalent Booster 25yrs & up 06/18/2021   Moderna Sars-Covid-2 Vaccination 11/28/2019, 12/26/2019, 05/21/2020, 01/08/2021   PNEUMOCOCCAL CONJUGATE-20 03/07/2021   Pfizer Covid-19 Vaccine Bivalent Booster 22yrs & up 07/16/2022   Pneumococcal Conjugate-13 05/10/2014,  08/09/2015   Pneumococcal Polysaccharide-23 04/02/2017   Respiratory Syncytial Virus Vaccine,Recomb Aduvanted(Arexvy) 06/24/2022   Tdap 01/31/2013   Unspecified SARS-COV-2 Vaccination 07/09/2023   Zoster Recombinant(Shingrix ) 12/04/2016, 07/11/2019, 09/27/2019   Zoster, Live 07/04/2014    Screening Tests Health Maintenance  Topic Date Due   Lung Cancer Screening  10/23/2004   Mammogram  10/24/2022   Influenza Vaccine  04/29/2024   COVID-19 Vaccine (8 - 2025-26 season) 05/30/2024   Medicare Annual Wellness (AWV)  07/25/2025   Pneumococcal Vaccine: 50+ Years  Completed   DEXA SCAN  Completed   Zoster Vaccines- Shingrix   Completed   Meningococcal B Vaccine  Aged Out   DTaP/Tdap/Td  Discontinued   Colonoscopy  Discontinued   Hepatitis C Screening  Discontinued    Health Maintenance Items Addressed:  Referral sent for Low Dose Chest CT (smoker/hx smoking)   I have recommended that this patient have a Mammogram but she declines at this time. I have discussed the risks and benefits of this procedure with her. The patient verbalizes understanding.   Additional Screening:  Vision Screening: Recommended annual ophthalmology exams for early detection of glaucoma and other disorders of the eye. Is the patient up to date with their annual eye exam?  No    Dental Screening: Recommended annual dental exams for proper oral hygiene  Community Resource Referral / Chronic Care Management: CRR required this visit?  No   CCM required this visit?  No   Plan:    I have personally reviewed and noted the following in the patient's chart:   Medical and social history Use of alcohol, tobacco or illicit drugs  Current medications and supplements including opioid prescriptions. Patient is currently taking opioid prescriptions. Information provided to patient regarding non-opioid alternatives. Patient advised to discuss non-opioid treatment plan with their provider. Functional ability and  status Nutritional status Physical activity Advanced directives List of other physicians Hospitalizations, surgeries, and ER visits in previous 12 months Vitals Screenings to include cognitive, depression, and falls Referrals and appointments  In addition, I have reviewed and discussed with patient certain preventive protocols, quality metrics, and best practice recommendations. A written personalized care plan for preventive services as well as general preventive health recommendations were provided to patient.   Barbara [name], CMA   07/25/2024   After Visit Summary: (Declined) Due to this being a telephonic visit, with patients personalized plan was offered to patient but patient Declined AVS at this time   Notes: Nothing significant to report at this time.

## 2024-07-25 NOTE — Patient Instructions (Addendum)
 Ms. Palinkas,  Thank you for taking the time for your Medicare Wellness Visit. I appreciate your continued commitment to your health goals. Please review the care plan we discussed, and feel free to reach out if I can assist you further.  Medicare recommends these wellness visits once per year to help you and your care team stay ahead of potential health issues. These visits are designed to focus on prevention, allowing your provider to concentrate on managing your acute and chronic conditions during your regular appointments.  Please note that Annual Wellness Visits do not include a physical exam. Some assessments may be limited, especially if the visit was conducted virtually. If needed, we may recommend a separate in-person follow-up with your provider.  Ongoing Care Seeing your primary care provider every 3 to 6 months helps us  monitor your health and provide consistent, personalized care.  Referrals If a referral was made during today's visit and you haven't received any updates within two weeks, please contact the referred provider directly to check on the status.  Recommended Screenings:  Health Maintenance  Topic Date Due   Screening for Lung Cancer  10/23/2004   Breast Cancer Screening  10/24/2022   Flu Shot  04/29/2024   COVID-19 Vaccine (8 - 2025-26 season) 05/30/2024   Medicare Annual Wellness Visit  06/23/2024   Pneumococcal Vaccine for age over 78  Completed   DEXA scan (bone density measurement)  Completed   Zoster (Shingles) Vaccine  Completed   Meningitis B Vaccine  Aged Out   DTaP/Tdap/Td vaccine  Discontinued   Colon Cancer Screening  Discontinued   Hepatitis C Screening  Discontinued       07/25/2024   11:46 AM  Advanced Directives  Does Patient Have a Medical Advance Directive? No  Would patient like information on creating a medical advance directive? No - Patient declined   Advance Care Planning is important because it: Ensures you receive medical care that  aligns with your values, goals, and preferences. Provides guidance to your family and loved ones, reducing the emotional burden of decision-making during critical moments.  Vision: Annual vision screenings are recommended for early detection of glaucoma, cataracts, and diabetic retinopathy. These exams can also reveal signs of chronic conditions such as diabetes and high blood pressure.  Dental: Annual dental screenings help detect early signs of oral cancer, gum disease, and other conditions linked to overall health, including heart disease and diabetes.   Managing Pain Without Opioids Opioids are strong medicines used to treat moderate to severe pain. For some people, especially those who have long-term (chronic) pain, opioids may not be the best choice for pain management due to: Side effects like nausea, constipation, and sleepiness. The risk of addiction (opioid use disorder). The longer you take opioids, the greater your risk of addiction. Pain that lasts for more than 3 months is called chronic pain. Managing chronic pain usually requires more than one approach and is often provided by a team of health care providers working together (multidisciplinary approach). Pain management may be done at a pain management center or pain clinic. How to manage pain without the use of opioids Use non-opioid medicines Non-opioid medicines for pain may include: Over-the-counter or prescription non-steroidal anti-inflammatory drugs (NSAIDs). These may be the first medicines used for pain. They work well for muscle and bone pain, and they reduce swelling. Acetaminophen . This over-the-counter medicine may work well for milder pain but not swelling. Antidepressants. These may be used to treat chronic pain. A certain type  of antidepressant (tricyclics) is often used. These medicines are given in lower doses for pain than when used for depression. Anticonvulsants. These are usually used to treat seizures but may  also reduce nerve (neuropathic) pain. Muscle relaxants. These relieve pain caused by sudden muscle tightening (spasms). You may also use a pain medicine that is applied to the skin as a patch, cream, or gel (topical analgesic), such as a numbing medicine. These may cause fewer side effects than medicines taken by mouth. Do certain therapies as directed Some therapies can help with pain management. They include: Physical therapy. You will do exercises to gain strength and flexibility. A physical therapist may teach you exercises to move and stretch parts of your body that are weak, stiff, or painful. You can learn these exercises at physical therapy visits and practice them at home. Physical therapy may also involve: Massage. Heat wraps or applying heat or cold to affected areas. Electrical signals that interrupt pain signals (transcutaneous electrical nerve stimulation, TENS). Weak lasers that reduce pain and swelling (low-level laser therapy). Signals from your body that help you learn to regulate pain (biofeedback). Occupational therapy. This helps you to learn ways to function at home and work with less pain. Recreational therapy. This involves trying new activities or hobbies, such as a physical activity or drawing. Mental health therapy, including: Cognitive behavioral therapy (CBT). This helps you learn coping skills for dealing with pain. Acceptance and commitment therapy (ACT) to change the way you think and react to pain. Relaxation therapies, including muscle relaxation exercises and mindfulness-based stress reduction. Pain management counseling. This may be individual, family, or group counseling.  Receive medical treatments Medical treatments for pain management include: Nerve block injections. These may include a pain blocker and anti-inflammatory medicines. You may have injections: Near the spine to relieve chronic back or neck pain. Into joints to relieve back or joint  pain. Into nerve areas that supply a painful area to relieve body pain. Into muscles (trigger point injections) to relieve some painful muscle conditions. A medical device placed near your spine to help block pain signals and relieve nerve pain or chronic back pain (spinal cord stimulation device). Acupuncture. Follow these instructions at home Medicines Take over-the-counter and prescription medicines only as told by your health care provider. If you are taking pain medicine, ask your health care providers about possible side effects to watch out for. Do not drive or use heavy machinery while taking prescription opioid pain medicine. Lifestyle  Do not use drugs or alcohol to reduce pain. If you drink alcohol, limit how much you have to: 0-1 drink a day for women who are not pregnant. 0-2 drinks a day for men. Know how much alcohol is in a drink. In the [place] one drink equals one 12 oz bottle of beer (355 mL), one 5 oz glass of wine (148 mL), or one 1 oz glass of hard liquor (44 mL). Do not use any products that contain nicotine  or tobacco. These products include cigarettes, chewing tobacco, and vaping devices, such as e-cigarettes. If you need help quitting, ask your health care provider. Eat a healthy diet and maintain a healthy weight. Poor diet and excess weight may make pain worse. Eat foods that are high in fiber. These include fresh fruits and vegetables, whole grains, and beans. Limit foods that are high in fat and processed sugars, such as fried and sweet foods. Exercise regularly. Exercise lowers stress and may help relieve pain. Ask your health care provider what  activities and exercises are safe for you. If your health care provider approves, join an exercise class that combines movement and stress reduction. Examples include yoga and tai chi. Get enough sleep. Lack of sleep may make pain worse. Lower stress as much as possible. Practice stress reduction techniques as told by  your therapist. General instructions Work with all your pain management providers to find the treatments that work best for you. You are an important member of your pain management team. There are many things you can do to reduce pain on your own. Consider joining an online or in-person support group for people who have chronic pain. Keep all follow-up visits. This is important. Where to find more information You can find more information about managing pain without opioids from: American Academy of Pain Medicine: painmed.org Institute for Chronic Pain: instituteforchronicpain.org American Chronic Pain Association: theacpa.org Contact a health care provider if: You have side effects from pain medicine. Your pain gets worse or does not get better with treatments or home therapy. You are struggling with anxiety or depression. Summary Many types of pain can be managed without opioids. Chronic pain may respond better to pain management without opioids. Pain is best managed when you and a team of health care providers work together. Pain management without opioids may include non-opioid medicines, medical treatments, physical therapy, mental health therapy, and lifestyle changes. Tell your health care providers if your pain gets worse or is not being managed well enough. This information is not intended to replace advice given to you by your health care provider. Make sure you discuss any questions you have with your health care provider. Document Revised: 12/26/2020 Document Reviewed: 12/26/2020 Elsevier Patient Education  2024 [employer].

## 2024-07-26 ENCOUNTER — Telehealth: Payer: Self-pay

## 2024-07-26 NOTE — Telephone Encounter (Signed)
 Copied from CRM [id number]. Topic: Clinical - Medical Advice >> Jul 25, 2024  4:39 PM Nessti S wrote: Reason for CRM: pt called because she wants to receive flu shot and pneumococcal shot during appt

## 2024-07-26 NOTE — Telephone Encounter (Signed)
 Message routed to incorrect office. Please see message below.   Thanks,  Ritamarie Arkin CCMA

## 2024-07-29 ENCOUNTER — Ambulatory Visit: Admitting: Internal Medicine

## 2024-07-29 ENCOUNTER — Ambulatory Visit

## 2024-07-29 ENCOUNTER — Other Ambulatory Visit: Payer: Self-pay | Admitting: Vascular Surgery

## 2024-07-29 DIAGNOSIS — Z8679 Personal history of other diseases of the circulatory system: Secondary | ICD-10-CM

## 2024-08-08 ENCOUNTER — Ambulatory Visit: Payer: Self-pay

## 2024-08-08 NOTE — Telephone Encounter (Signed)
 FYI Only or Action Required?: FYI only for provider: appointment scheduled on 11/11.  Patient was last seen in primary care on 04/26/2024 by Rollene Almarie LABOR, MD.  Called Nurse Triage reporting Abdominal Pain.  Symptoms began several days ago.  Interventions attempted: Rest, hydration, or home remedies.  Symptoms are: gradually worsening.  Triage Disposition: See Physician Within 24 Hours  Patient/caregiver understands and will follow disposition?: Yes             Copied from CRM [id number]. Topic: Clinical - Red Word Triage >> Aug 08, 2024  1:35 PM Alfonso ORN wrote: Red Word that prompted transfer to Nurse Triage: stomach bug past several days, not able to eat , extreme pain/nausea Reason for Disposition  [1] MODERATE pain (e.g., interferes with normal activities) AND [2] pain comes and goes (cramps) AND [3] present > 24 hours  (Exception: Pain with Vomiting or Diarrhea - see that Guideline.)  Answer Assessment - Initial Assessment Questions 1. LOCATION: Where does it hurt?      Below waist area, mid abdomen   2. RADIATION: Does the pain shoot anywhere else? (e.g., chest, back)     No   3. ONSET: When did the pain begin? (e.g., minutes, hours or days ago)      X 4 days   4. SUDDEN: Gradual or sudden onset?     Gradual   5. PATTERN Does the pain come and go, or is it constant?     Constant   6. SEVERITY: How bad is the pain?  (e.g., Scale 1-10; mild, moderate, or severe)     Moderate   7. RECURRENT SYMPTOM: Have you ever had this type of stomach pain before? If Yes, ask: When was the last time? and What happened that time?      No   8. CAUSE: What do you think is causing the stomach pain? (e.g., gallstones, recent abdominal surgery)     Possible stomach bug   9. RELIEVING/AGGRAVATING FACTORS: What makes it better or worse? (e.g., antacids, bending or twisting motion, bowel movement)     Nothing   10. OTHER SYMPTOMS: Do you have any  other symptoms? (e.g., back pain, diarrhea, fever, urination pain, vomiting)  Nausea noted.   Patient states she is only able to tolerate Ensure nutritional drinks in which she is drinking to avoid dehydration. Pt. Advised to drink water as well if she is able to tolerate as well. No vomiting noted. SABRA Appointment scheduled for evaluation. Patient agrees with plan of care, and will call back if anything changes, or if symptoms worsen.  Protocols used: Abdominal Pain - Female-A-AH

## 2024-08-09 ENCOUNTER — Other Ambulatory Visit: Payer: Self-pay | Admitting: Internal Medicine

## 2024-08-09 ENCOUNTER — Ambulatory Visit: Admitting: Emergency Medicine

## 2024-08-09 DIAGNOSIS — E782 Mixed hyperlipidemia: Secondary | ICD-10-CM

## 2024-08-16 ENCOUNTER — Telehealth: Payer: Self-pay

## 2024-08-16 NOTE — Telephone Encounter (Signed)
 Copied from CRM #[id number]. Topic: General - Transportation >> Aug 16, 2024  2:27 PM Nessti S wrote: Reason for CRM: pt sister called because she wants to know what should she do for pt? There is no way she can ride to [place] and live an hour away. Would like a call back soon as possible

## 2024-08-18 NOTE — Telephone Encounter (Signed)
 Pt stated that she started feeling better late yesterday afternoon. Pt advised to call office if she decides that she would like to be seen. Verbalized understanding.

## 2024-09-19 NOTE — Progress Notes (Unsigned)
 "  Patient name: [name] MRN: [id number] DOB: 09-28-1946 Sex: female  REASON FOR VISIT: 6 month f/u EVAR surveillance  HPI: [name] is a 78 y.o. female presents for 59-month follow-up for surveillance of EVAR.  She is well-known to our practice and previously had an EVAR on 04/20/2023 for 5.2 cm AAA.  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  States she had an episode recently where thought she was going to die when her whole body felt numb.   Past Medical History:  Diagnosis Date   AAA (abdominal aortic aneurysm)    saw Dr Gerlean 11/2013.  2.7 cm   Arthritis    Chicken pox    COPD (chronic obstructive pulmonary disease) (HCC)    smokes 1/2ppd   DDD (degenerative disc disease), lumbar    GERD (gastroesophageal reflux disease)    diet controlled   Headache    Hx of migraines   History of blood transfusion    History of lumbar fusion 08/09/2014   Hyperlipidemia    IBS (irritable bowel syndrome)    diet contolled   Mild mitral regurgitation    Peripheral vascular disease    AAA   RBBB    S/P arthroscopy of right shoulder 12/15/2017   Seasonal allergies    Varicose veins     Past Surgical History:  Procedure Laterality Date   ABDOMINAL AORTIC ENDOVASCULAR STENT GRAFT Bilateral 04/20/2023   Procedure: ABDOMINAL AORTIC ENDOVASCULAR STENT GRAFT;  Surgeon: Gretta Lonni PARAS, MD;  [facility];  Service: Vascular;  Laterality: Bilateral;   APPENDECTOMY     BACK SURGERY N/A [id number]   5 back surgeries   BREAST BIOPSY  1989,05   lt br bx   BREAST EXCISIONAL BIOPSY Bilateral    CATARACT EXTRACTION W/ INTRAOCULAR LENS IMPLANT Bilateral 2018   CHOLECYSTECTOMY     COLONOSCOPY     EXCISION/RELEASE BURSA HIP Right 05/12/2013   Procedure: EXCISION RIGHT TROCHANTERIC BURSA/CALCIFICATION ;  Surgeon: Toribio JULIANNA Chancy, MD;  Location: [facility];  Service: Orthopedics;  Laterality: Right;   FASCIOTOMY Right 05/12/2013   Procedure:  RIGHT FASCIOTOMY HIP ANY TYPE;  Surgeon: Toribio JULIANNA Chancy, MD;  Location: [facility];  Service: Orthopedics;  Laterality: Right;   LARYNGOSCOPY AND BRONCHOSCOPY N/A 08/15/2019   Procedure: BRONCHOSCOPY/LARYNGOSCOPY;  Surgeon: Karis Clunes, MD;  Location: [facility];  Service: ENT;  Laterality: N/A;   NM MYOCAR PERF WALL MOTION  04/13/2003   negative   SHOULDER ARTHROSCOPY WITH OPEN ROTATOR CUFF REPAIR Right 2018   skin cancer removed right leg     TONSILLECTOMY AND ADENOIDECTOMY  1950   TOTAL HIP ARTHROPLASTY Left 2006   lt total hip   TOTAL HIP ARTHROPLASTY Right 2011   TOTAL HIP ARTHROPLASTY Right 2016   second surgery due to recall   ULTRASOUND GUIDANCE FOR VASCULAR ACCESS Bilateral 04/20/2023   Procedure: ULTRASOUND GUIDANCE FOR VASCULAR ACCESS;  Surgeon: Gretta Lonni PARAS, MD;  Location: [facility] OR;  Service: Vascular;  Laterality: Bilateral;   US  ECHOCARDIOGRAPHY  04/13/2003   EF 65%, mild TR w/suggestion of mild pulmonary hypertension,thickened anterior leaflet,central mild MR.   VAGINAL HYSTERECTOMY  1993    Family History  Problem Relation Age of Onset   Early death Mother        aortic aneurysm   Vasculitis Mother        temporal arteritis   Aortic aneurysm Mother    Heart disease  Mother        before age 76   Hyperlipidemia Mother    Hypertension Mother    COPD Sister    Hypertension Sister    Depression Sister    Heart disease Sister        before age 49   Post-traumatic stress disorder Sister    Bipolar disorder Sister    Brain cancer Maternal Aunt 89       brain   Heart disease Maternal Grandmother        aortic aneurysm   Aortic aneurysm Maternal Grandmother    Hypertension Sister    Throat cancer Maternal Grandfather    Breast cancer Neg Hx     SOCIAL HISTORY: Social History   Tobacco Use   Smoking status: Every Day    Current packs/day: 0.75    Average packs/day: 0.8 packs/day for 30.0 years (22.5 ttl pk-yrs)    Types:  Cigarettes   Smokeless tobacco: Never   Tobacco comments:    Cut way back about 4-5 cigs/day  Substance Use Topics   Alcohol use: Not Currently    Alcohol/week: 0.0 standard drinks of alcohol    Comment: very occasional, not monthly    Allergies  Allergen Reactions   Bee Venom     Other reaction(s): Redness   Cephalexin Other (See Comments)    Headaches   Gabapentin Diarrhea    Current Outpatient Medications  Medication Sig Dispense Refill   amLODipine  (NORVASC ) 10 MG tablet Take 1 tablet (10 mg total) by mouth daily. 90 tablet 3   atorvastatin  (LIPITOR) 20 MG tablet TAKE ONE TABLET ONCE DAILY 90 tablet 0   Cobalamin Combinations (NEURIVA PLUS PO) Take 1 capsule by mouth daily.     cyclobenzaprine  (FLEXERIL ) 5 MG tablet Take 1 tablet (5 mg total) by mouth at bedtime. 180 tablet 3   diazepam  (VALIUM ) 5 MG tablet TAKE ONE TABLET ONCE DAILY AS NEEDED FOR ANXIETY 30 tablet 1   diphenoxylate -atropine  (LOMOTIL ) 2.5-0.025 MG tablet Take 1 tablet by mouth 2 (two) times daily as needed for diarrhea or loose stools. 45 tablet 0   furosemide  (LASIX ) 20 MG tablet Take 1 tablet (20 mg total) by mouth daily as needed. 30 tablet 3   HYDROcodone -acetaminophen  (NORCO) 10-325 MG tablet Take 1 tablet by mouth every 6 (six) hours as needed.     montelukast  (SINGULAIR ) 10 MG tablet Take 1 tablet (10 mg total) by mouth at bedtime. 90 tablet 1   promethazine  (PHENERGAN ) 12.5 MG tablet TAKE ONE TABLET EVERY 8 HOURS AS NEEDED NAUSEA/VOMITING 20 tablet 0   vitamin B-12 (CYANOCOBALAMIN ) 1000 MCG tablet Take 1 tablet (1,000 mcg total) by mouth daily. 100 tablet 2   No current facility-administered medications for this visit.    REVIEW OF SYSTEMS:  [X]  denotes positive finding, [ ]  denotes negative finding Cardiac  Comments:  Chest pain or chest pressure:    Shortness of breath upon exertion:    Short of breath when lying flat:    Irregular heart rhythm:        Vascular    Pain in calf, thigh, or  hip brought on by ambulation:    Pain in feet at night that wakes you up from your sleep:     Blood clot in your veins:    Leg swelling:         Pulmonary    Oxygen at home:    Productive cough:     Wheezing:  Neurologic    Sudden weakness in arms or legs:     Sudden numbness in arms or legs:     Sudden onset of difficulty speaking or slurred speech:    Temporary loss of vision in one eye:     Problems with dizziness:         Gastrointestinal    Blood in stool:     Vomited blood:         Genitourinary    Burning when urinating:     Blood in urine:        Psychiatric    Major depression:         Hematologic    Bleeding problems:    Problems with blood clotting too easily:        Skin    Rashes or ulcers:        Constitutional    Fever or chills:      PHYSICAL EXAM: There were no vitals filed for this visit.  GENERAL: The patient is a well-nourished female, in no acute distress. The vital signs are documented above. CARDIAC: There is a regular rate and rhythm.  VASCULAR:  Bilateral femoral pulses palpable Bilateral PT pulses palpable PULMONARY: No respiratory distress. ABDOMEN: Soft and non-tender.  No pain with palpation of aneurysm. MUSCULOSKELETAL: There are no major deformities or cyanosis. NEUROLOGIC: No focal weakness or paresthesias are detected. PSYCHIATRIC: The patient has a normal affect.  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 female presents for 41-month follow-up of abdominal aortic aneurysm that previously underwent repair with EVAR.     She is well-known to our practice and previously had an EVAR on 04/20/2023 for 5.2 cm AAA.  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  EVAR duplex  looks good today and aneurysm smaller at 5.1 cm with no endoleak.  She has palpable femoral and pedal pulses.  Also discussed her celiac aneurysm showed no significant change measuring 1.39 cm.  Will follow-up in 1 year with repeat EVAR duplex for ongoing surveillance.  Lonni DOROTHA Gaskins, MD [facility] Office: [phone]      "

## 2024-09-20 ENCOUNTER — Ambulatory Visit: Admitting: Vascular Surgery

## 2024-09-20 ENCOUNTER — Ambulatory Visit (HOSPITAL_COMMUNITY)
Admission: RE | Admit: 2024-09-20 | Discharge: 2024-09-20 | Disposition: A | Discharge status: Home / Self Care | Source: Ambulatory Visit | Attending: Vascular Surgery | Admitting: Vascular Surgery

## 2024-09-20 ENCOUNTER — Encounter: Payer: Self-pay | Admitting: Vascular Surgery

## 2024-09-20 VITALS — BP 135/65 | HR 107 | Temp 98.4°F | Resp 18 | Ht 66.0 in | Wt 109.6 lb

## 2024-09-20 DIAGNOSIS — Z8679 Personal history of other diseases of the circulatory system: Secondary | ICD-10-CM | POA: Diagnosis not present

## 2024-09-20 DIAGNOSIS — I7143 Infrarenal abdominal aortic aneurysm, without rupture: Secondary | ICD-10-CM

## 2024-09-20 DIAGNOSIS — I728 Aneurysm of other specified arteries: Secondary | ICD-10-CM | POA: Diagnosis not present

## 2024-09-20 DIAGNOSIS — Z95828 Presence of other vascular implants and grafts: Secondary | ICD-10-CM | POA: Insufficient documentation

## 2024-10-11 ENCOUNTER — Ambulatory Visit: Admitting: Internal Medicine

## 2024-10-28 ENCOUNTER — Ambulatory Visit: Admitting: Internal Medicine

## 2024-10-31 ENCOUNTER — Other Ambulatory Visit: Payer: Self-pay | Admitting: Internal Medicine

## 2024-11-08 ENCOUNTER — Ambulatory Visit: Admitting: Internal Medicine

## 2025-08-01 ENCOUNTER — Ambulatory Visit

## 2025-08-01 ENCOUNTER — Encounter: Admitting: Internal Medicine
